# Patient Record
Sex: Female | Born: 1935
Health system: Southern US, Community
[De-identification: ages and names within clinical notes are randomized; demographics above are authoritative.]

## PROBLEM LIST (undated history)

## (undated) DIAGNOSIS — R634 Abnormal weight loss: Secondary | ICD-10-CM

## (undated) DIAGNOSIS — E039 Hypothyroidism, unspecified: Secondary | ICD-10-CM

## (undated) DIAGNOSIS — K219 Gastro-esophageal reflux disease without esophagitis: Secondary | ICD-10-CM

## (undated) DIAGNOSIS — I10 Essential (primary) hypertension: Secondary | ICD-10-CM

## (undated) DIAGNOSIS — K589 Irritable bowel syndrome without diarrhea: Secondary | ICD-10-CM

## (undated) DIAGNOSIS — J189 Pneumonia, unspecified organism: Secondary | ICD-10-CM

## (undated) DIAGNOSIS — I499 Cardiac arrhythmia, unspecified: Secondary | ICD-10-CM

## (undated) DIAGNOSIS — D649 Anemia, unspecified: Secondary | ICD-10-CM

## (undated) DIAGNOSIS — J449 Chronic obstructive pulmonary disease, unspecified: Secondary | ICD-10-CM

## (undated) DIAGNOSIS — N289 Disorder of kidney and ureter, unspecified: Secondary | ICD-10-CM

## (undated) DIAGNOSIS — R0602 Shortness of breath: Secondary | ICD-10-CM

## (undated) DIAGNOSIS — L853 Xerosis cutis: Secondary | ICD-10-CM

## (undated) DIAGNOSIS — A0472 Enterocolitis due to Clostridium difficile, not specified as recurrent: Secondary | ICD-10-CM

## (undated) DIAGNOSIS — M1990 Unspecified osteoarthritis, unspecified site: Secondary | ICD-10-CM

## (undated) HISTORY — DX: Cardiac arrhythmia, unspecified: I49.9

## (undated) HISTORY — PX: CARDIAC CATHETERIZATION: SHX172

## (undated) HISTORY — PX: ESOPHAGOGASTRODUODENOSCOPY: SHX1529

## (undated) HISTORY — PX: THYROIDECTOMY: SHX17

## (undated) HISTORY — PX: TONSILLECTOMY: SUR1361

## (undated) HISTORY — DX: Essential (primary) hypertension: I10

## (undated) HISTORY — DX: Disorder of kidney and ureter, unspecified: N28.9

## (undated) HISTORY — DX: Chronic obstructive pulmonary disease, unspecified: J44.9

## (undated) HISTORY — PX: COLONOSCOPY: SHX174

## (undated) HISTORY — PX: TOE AMPUTATION: SHX809

## (undated) HISTORY — PX: ANTERIOR AND POSTERIOR VAGINAL REPAIR: SUR5

---

## 1998-11-20 ENCOUNTER — Ambulatory Visit (HOSPITAL_BASED_OUTPATIENT_CLINIC_OR_DEPARTMENT_OTHER): Admission: RE | Admit: 1998-11-20 | Discharge: 1998-11-20 | Payer: Self-pay

## 2010-12-17 ENCOUNTER — Ambulatory Visit (INDEPENDENT_AMBULATORY_CARE_PROVIDER_SITE_OTHER): Payer: Medicare Other | Admitting: Internal Medicine

## 2010-12-17 ENCOUNTER — Ambulatory Visit (INDEPENDENT_AMBULATORY_CARE_PROVIDER_SITE_OTHER)
Admission: RE | Admit: 2010-12-17 | Discharge: 2010-12-17 | Disposition: A | Discharge status: Home / Self Care | Payer: Medicare Other | Source: Ambulatory Visit | Attending: Internal Medicine | Admitting: Internal Medicine

## 2010-12-17 ENCOUNTER — Encounter: Payer: Self-pay | Admitting: Internal Medicine

## 2010-12-17 VITALS — BP 110/78 | HR 85 | Resp 16 | Ht 60.0 in | Wt 115.8 lb

## 2010-12-17 DIAGNOSIS — J449 Chronic obstructive pulmonary disease, unspecified: Secondary | ICD-10-CM

## 2010-12-17 DIAGNOSIS — Z23 Encounter for immunization: Secondary | ICD-10-CM

## 2010-12-17 MED ORDER — TIOTROPIUM BROMIDE MONOHYDRATE 18 MCG IN CAPS: 18.0000 ug | ORAL_CAPSULE | Freq: Every day | RESPIRATORY_TRACT | Status: DC

## 2010-12-17 NOTE — Progress Notes (Signed)
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 Wende Bushy in [facility] because of COPD. She had smoked 2 packs per day for 26 years/52 pack years, ending in 1984. Mrs. Libman assumes the diagnosis is COPD. She had seen Dr.DeCoy/Pulmonary in the past before he left town. He treated her with Spiriva which has helped. Symbicort 160 has not helped. She is aware of progressive dyspnea on exertion over the last several years. Has had episodes of bronchitis. Daily cough brings yellow and sometimes green or brown sputum. She denies blood. She was hospitalized once with pneumonia but not for other lung problems. She has not had home oxygen. Has nebulizer machine at home but is not using it. Went to an urgent care this summer for bronchitis, treated with prednisone and an antibiotic. Cardiac problems limited to occasional rapid heartbeat. Anemia treated with iron. Has had 3 spinal steroid injections and is followed by a spine specialist. 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 Intolerant of aspirin and Avelox. Asks flu shot. Has had pneumococcal vaccine but not sure of the date.  ROS See HPI Constitutional:   No-   weight loss, night sweats, fevers, chills, fatigue, lassitude. HEENT:   No-  headaches, difficulty swallowing, tooth/dental problems, sore throat,       No-  sneezing, itching, ear ache, nasal congestion, post nasal drip,  CV:  No-   chest pain, orthopnea, PND, swelling in lower extremities, anasarca, dizziness, palpitations Resp: +  shortness of breath with exertion, Not or at rest.              +  productive cough,  No non-productive cough,  No- coughing up of blood.              No-   change in color of mucus.  No- wheezing.   Skin: No-   rash or lesions. GI:  No-   heartburn, indigestion, abdominal pain, nausea,  vomiting, diarrhea,                 change in bowel habits, loss of appetite GU: No-   dysuria, change in color of urine, no urgency or frequency.  No- flank pain. MS:  No-   joint pain or swelling.  No- decreased range of motion.  + back pain. Neuro-     nothing unusual Psych:  No- change in mood or affect. No depression or anxiety.  No memory loss.  OBJ General- Alert, Oriented, Affect-appropriate, Distress- none acute; alert healthy appearing woman Skin- rash-none, lesions- none, excoriation- none Lymphadenopathy- none Head- atraumatic            Eyes- Gross vision intact, PERRLA, conjunctivae clear secretions            Ears- Hearing, canals-normal            Nose- Clear, no-Septal dev, mucus, polyps, erosion, perforation             Throat- Mallampati II , mucosa clear , drainage- none, tonsils- atrophic Neck- flexible , trachea midline, no stridor , thyroid nl, carotid no bruit Chest - symmetrical excursion , unlabored           Heart/CV- RRR , no murmur , no gallop  , no rub, nl s1 s2                           -  JVD- none , edema- none, stasis changes- none, varices- none           Lung- clear to P&A- distant,   wheeze- none, cough- none , dullness-none, rub- none           Chest wall-  Abd- tender-no, distended-no, bowel sounds-present, HSM- no Br/ Gen/ Rectal- Not done, not indicated Extrem- cyanosis- none, clubbing, none, atrophy- none, strength- nl Neuro- grossly intact to observation

## 2010-12-17 NOTE — Patient Instructions (Addendum)
Continue spiriva - refill script to send  Don't need to use Symbicort  May use Proair rescue inhaler up to 4 times daily if needed  Order- CXR dx COPD  Flu vax  Pneumonia vax

## 2010-12-19 NOTE — Progress Notes (Signed)
Quick Note:  Spoke with patient regarding results and CT Chest reccommended; pt states she does not want to go through with this until she talks with her kidney doctor tomorrow. Pt will call me back and let me know her decision; CY has been notified of this matter. Pt understands we draw labs prior to CT chest for kidney functions but still refuses until she speaks with her kidney dr. Stann Mainland await a call back tomorrow. ______

## 2010-12-20 ENCOUNTER — Institutional Professional Consult (permissible substitution): Payer: Self-pay | Admitting: Internal Medicine

## 2010-12-20 ENCOUNTER — Telehealth: Payer: Self-pay | Admitting: Internal Medicine

## 2010-12-20 ENCOUNTER — Encounter: Payer: Self-pay | Admitting: Internal Medicine

## 2010-12-20 DIAGNOSIS — R918 Other nonspecific abnormal finding of lung field: Secondary | ICD-10-CM

## 2010-12-20 MED ORDER — ALBUTEROL SULFATE HFA 108 (90 BASE) MCG/ACT IN AERS: 2.0000 | INHALATION_SPRAY | RESPIRATORY_TRACT | Status: DC | PRN

## 2010-12-20 NOTE — Telephone Encounter (Signed)
I called and spoke with patient-states she spoke to her Kidney doctor-Dr Leretha Dykes in [facility] like to speak with CY/nurse regarding patient and why the contrast needs to be used. I explained to patient that we will contact Dr Leretha Dykes and have her and CY discuss this matter.

## 2010-12-20 NOTE — Assessment & Plan Note (Signed)
We certainly expect COPD in this setting but have a little objective confirmation as yet. 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 Plan-flu vaccine and we will update pneumococcal vaccine to be sure that she has had it. Chest x-ray. Anticipate scheduling PFT.

## 2010-12-20 NOTE — Progress Notes (Signed)
Quick Note:  Pt returned my call and per Kidney Dr she is at risk for dyialisis and therefore CY has changed order to CT chest no contrast. Pt is aware of order sent to [facility]. ______

## 2010-12-20 NOTE — Telephone Encounter (Signed)
Per CY-okay to just change the order to CT chest no contrast. Pt is willing to have Test done now and understands the order has been placed and [facility] will call with information.

## 2010-12-24 ENCOUNTER — Ambulatory Visit (INDEPENDENT_AMBULATORY_CARE_PROVIDER_SITE_OTHER)
Admission: RE | Admit: 2010-12-24 | Discharge: 2010-12-24 | Disposition: A | Discharge status: Home / Self Care | Payer: Medicare Other | Source: Ambulatory Visit | Attending: Internal Medicine | Admitting: Internal Medicine

## 2010-12-24 DIAGNOSIS — R222 Localized swelling, mass and lump, trunk: Secondary | ICD-10-CM

## 2010-12-24 DIAGNOSIS — R918 Other nonspecific abnormal finding of lung field: Secondary | ICD-10-CM

## 2010-12-28 NOTE — Progress Notes (Signed)
Quick Note:  Spoke with patient regarding results and recs from CY-patient states her PCP Dr Wende Bushy at [facility] in [place], [place] has retired/moved on and she doesn't have a PCP at this time. I have the number to [facility] and called to see which PCP patient will have now-[phone]-office is closed at this time and will need to call back after 130 pm. In the meantime patient is calling her Kidney Dr to see if she is willing to receive results and order thyroid US for patient. Pt will call me back asap today. ______

## 2011-01-01 NOTE — Progress Notes (Signed)
Quick Note:  Spoke with patient-she stated she will now have Dr. Tomma Lightning as her PCP at [facility]. Pt understands I will send a copy of the results to this PCP and at the request of patient I have sent a copy to Dr Shela Commons. Zeken-patients kidney MD in [place]. Pt requested a copy sent to her home address as well. ______

## 2011-01-16 ENCOUNTER — Emergency Department (INDEPENDENT_AMBULATORY_CARE_PROVIDER_SITE_OTHER): Payer: Medicare Other

## 2011-01-16 ENCOUNTER — Emergency Department (HOSPITAL_BASED_OUTPATIENT_CLINIC_OR_DEPARTMENT_OTHER)
Admission: EM | Admit: 2011-01-16 | Discharge: 2011-01-16 | Disposition: A | Discharge status: Home / Self Care | Payer: Medicare Other | Attending: Emergency Medicine | Admitting: Emergency Medicine

## 2011-01-16 ENCOUNTER — Encounter (HOSPITAL_BASED_OUTPATIENT_CLINIC_OR_DEPARTMENT_OTHER): Payer: Self-pay

## 2011-01-16 DIAGNOSIS — S0181XA Laceration without foreign body of other part of head, initial encounter: Secondary | ICD-10-CM

## 2011-01-16 DIAGNOSIS — J449 Chronic obstructive pulmonary disease, unspecified: Secondary | ICD-10-CM | POA: Insufficient documentation

## 2011-01-16 DIAGNOSIS — W19XXXA Unspecified fall, initial encounter: Secondary | ICD-10-CM

## 2011-01-16 DIAGNOSIS — Z79899 Other long term (current) drug therapy: Secondary | ICD-10-CM | POA: Insufficient documentation

## 2011-01-16 DIAGNOSIS — M25559 Pain in unspecified hip: Secondary | ICD-10-CM

## 2011-01-16 DIAGNOSIS — I10 Essential (primary) hypertension: Secondary | ICD-10-CM | POA: Insufficient documentation

## 2011-01-16 DIAGNOSIS — M8708 Idiopathic aseptic necrosis of bone, other site: Secondary | ICD-10-CM

## 2011-01-16 DIAGNOSIS — G319 Degenerative disease of nervous system, unspecified: Secondary | ICD-10-CM

## 2011-01-16 DIAGNOSIS — S0990XA Unspecified injury of head, initial encounter: Secondary | ICD-10-CM

## 2011-01-16 DIAGNOSIS — S0180XA Unspecified open wound of other part of head, initial encounter: Secondary | ICD-10-CM | POA: Insufficient documentation

## 2011-01-16 DIAGNOSIS — J4489 Other specified chronic obstructive pulmonary disease: Secondary | ICD-10-CM | POA: Insufficient documentation

## 2011-01-16 DIAGNOSIS — M4120 Other idiopathic scoliosis, site unspecified: Secondary | ICD-10-CM

## 2011-01-16 DIAGNOSIS — I6789 Other cerebrovascular disease: Secondary | ICD-10-CM

## 2011-01-16 MED ORDER — LIDOCAINE-EPINEPHRINE 2 %-1:100000 IJ SOLN
INTRAMUSCULAR | Status: AC
  Administered 2011-01-16: 20 mL via INTRADERMAL
  Filled 2011-01-16: qty 1

## 2011-01-16 MED ORDER — LIDOCAINE-EPINEPHRINE 2 %-1:100000 IJ SOLN
20.0000 mL | Freq: Once | INTRAMUSCULAR | Status: AC
  Administered 2011-01-16: 20 mL via INTRADERMAL

## 2011-01-16 NOTE — ED Provider Notes (Signed)
History     CSN: [id number] Arrival date & time: 01/16/2011 12:10 PM   First MD Initiated Contact with Patient 01/16/11 1213      Chief Complaint  Patient presents with  . Fall  . Head Injury     Patient is a 77 y.o. female presenting with fall. The history is provided by the patient. No language interpreter was used.  Fall The accident occurred 1 to 2 hours ago. The fall occurred while standing. She fell from a height of 3 to 5 ft. She landed on a hard floor. The point of impact was the head. The pain is present in the left hip and head. The pain is at a severity of 3/10. The pain is mild. She was ambulatory at the scene. There was no entrapment after the fall. There was no alcohol use involved in the accident. Pertinent negatives include no visual change, no nausea, no vomiting, no headaches and no loss of consciousness. The symptoms are aggravated by standing. She has tried nothing for the symptoms.   Reports was reaching high for a garage door closing handle and fell backwards. Thinks she may have hit her head on the car in the garage. Denies. LOC.  Past Medical History  Diagnosis Date  . Hypertension   . Abnormal heart rhythm   . COPD (chronic obstructive pulmonary disease)   . Kidney disease     Past Surgical History  Procedure Date  . Tonsillectomy   . Thyroidectomy     Left  . Esophagogastroduodenoscopy     Family History  Problem Relation Age of Onset  . Emphysema Sister     smoker  . Allergies Brother   . Heart disease Brother   . Rheum arthritis Daughter   . Cancer Mother     GI  . Cancer Brother     throat  . Cancer Sister     unsure    History  Substance Use Topics  . Smoking status: Former Smoker -- 2.0 packs/day for 26 years    Types: Cigarettes    Quit date: 03/04/1982  . Smokeless tobacco: Not on file  . Alcohol Use: 0.6 oz/week    1 Shots of liquor per week    OB History    Grav Para Term Preterm Abortions TAB SAB Ect Mult Living              Review of Systems  Constitutional: Negative.   HENT: Negative.   Eyes: Negative.   Respiratory: Negative.   Cardiovascular: Negative.   Gastrointestinal: Negative.  Negative for nausea and vomiting.  Genitourinary: Negative.   Musculoskeletal: Negative.   Skin: Negative.   Neurological: Negative.  Negative for loss of consciousness and headaches.  Hematological: Negative.   Psychiatric/Behavioral: Negative.     Allergies  Avelox and Nsaids  Home Medications   Current Outpatient Rx  Name Route Sig Dispense Refill  . ALBUTEROL SULFATE HFA 108 (90 BASE) MCG/ACT IN AERS Inhalation Inhale 2 puffs into the lungs every 4 (four) hours as needed for wheezing or shortness of breath. 1 Inhaler prn  . AMLODIPINE BESYLATE 5 MG PO TABS Oral Take 1 tablet by mouth daily.    . ASPIRIN 81 MG PO TABS Oral Take 81 mg by mouth daily.      . CYMBALTA 60 MG PO CPEP Oral Take 1 tablet by mouth daily.    Marland Kitchen FAMOTIDINE 40 MG PO TABS Oral Take 1 tablet by mouth daily.    Marland Kitchen METOPROLOL  SUCCINATE 50 MG PO TB24 Oral Take 50 mg by mouth daily.      Marland Kitchen ONE-DAILY MULTI VITAMINS PO TABS Oral Take 1 tablet by mouth daily.      Marland Kitchen SYNTHROID 88 MCG PO TABS Oral Take 1 tablet by mouth daily.    Marland Kitchen TIOTROPIUM BROMIDE MONOHYDRATE 18 MCG IN CAPS Inhalation Place 1 capsule (18 mcg total) into inhaler and inhale daily. 90 capsule 3    BP 102/86  Pulse 81  Temp(Src) 97.9 F (36.6 C) (Oral)  Resp 16  Ht 5' (1.524 m)  Wt 116 lb (52.617 kg)  BMI 22.65 kg/m2  SpO2 98%  Physical Exam  Constitutional: She is oriented to person, place, and time. She appears well-developed and well-nourished.  HENT:  Head: Normocephalic.    Right Ear: External ear normal.  Left Ear: External ear normal.       Approx 2cm lac to (L) periorbital area. Lac noted w/ complete avulsion of tissue at center. Small amount active bleeding.  Eyes: Conjunctivae and EOM are normal. Pupils are equal, round, and reactive to light.    Neck: Normal range of motion.  Cardiovascular: Normal rate and regular rhythm.   Pulmonary/Chest: Effort normal and breath sounds normal.  Abdominal: Soft. Bowel sounds are normal. There is no tenderness.  Musculoskeletal: Normal range of motion.       Left hip: She exhibits tenderness.  Neurological: She is alert and oriented to person, place, and time. No cranial nerve deficit.  Skin: Skin is warm and dry.  Psychiatric: She has a normal mood and affect.    ED Course  LACERATION REPAIR Performed by: Leanne Chang Authorized by: Leanne Chang Consent: Verbal consent obtained. Written consent not obtained. Consent given by: patient Patient understanding: patient states understanding of the procedure being performed Relevant documents: relevant documents present and verified Test results: test results available and properly labeled Imaging studies: imaging studies available Patient identity confirmed: verbally with patient Body area: head/neck Laceration length: 3 cm Tendon involvement: none Nerve involvement: none Vascular damage: no Anesthesia: local infiltration Local anesthetic: lidocaine 1% with epinephrine Patient sedated: no Preparation: Patient was prepped and draped in the usual sterile fashion. Irrigation solution: saline Irrigation method: syringe Amount of cleaning: standard Debridement: none Degree of undermining: minimal Skin closure: 4-0 Prolene and 5-0 Prolene Technique: simple Approximation difficulty: simple Dressing: antibiotic ointment Comments: Wound edges anchored and aligned with 3 4.0 prolene sutures to approximate wound edges. Wound closure completed w/ 4 additional 5.0 prolene sutures. Wound edges well approximated.  Findings, wound/lac care and d/c plan discussed w/ pt. Pt agreeable w/ plan. Will encourage close f/u w/ PCP at [facility] (or here for suture removal). Pt agreeable w/ plan.   Labs Reviewed - No data  to display No results found.   No diagnosis found.    MDM  CT head and (L) hip imaging w/o acute findings. Pt w/o focal neurological findings.  Wound to (L) temporal/periorbital area repaired. T-Dap UTD.       Leanne Chang, NP 01/18/11 1610  Leanne Chang, NP 01/18/11 [phone]  Medical screening examination/treatment/procedure(s) were performed by non-physician practitioner and as supervising physician I was immediately available for consultation/collaboration.  Cyndra Numbers, MD 01/20/11 [phone]

## 2011-01-16 NOTE — ED Notes (Signed)
Pt was reaching up-lost balance-fell onto car then to ground-pain to left occipital area and lac to left lateral temple at eye brow-denies LOC

## 2011-01-22 ENCOUNTER — Encounter (HOSPITAL_BASED_OUTPATIENT_CLINIC_OR_DEPARTMENT_OTHER): Payer: Self-pay | Admitting: *Deleted

## 2011-01-22 ENCOUNTER — Emergency Department (HOSPITAL_BASED_OUTPATIENT_CLINIC_OR_DEPARTMENT_OTHER)
Admission: EM | Admit: 2011-01-22 | Discharge: 2011-01-22 | Disposition: A | Discharge status: Home / Self Care | Payer: Medicare Other | Attending: Emergency Medicine | Admitting: Emergency Medicine

## 2011-01-22 DIAGNOSIS — J449 Chronic obstructive pulmonary disease, unspecified: Secondary | ICD-10-CM | POA: Insufficient documentation

## 2011-01-22 DIAGNOSIS — J4489 Other specified chronic obstructive pulmonary disease: Secondary | ICD-10-CM | POA: Insufficient documentation

## 2011-01-22 DIAGNOSIS — I10 Essential (primary) hypertension: Secondary | ICD-10-CM | POA: Insufficient documentation

## 2011-01-22 DIAGNOSIS — Z4802 Encounter for removal of sutures: Secondary | ICD-10-CM | POA: Insufficient documentation

## 2011-01-22 DIAGNOSIS — Z79899 Other long term (current) drug therapy: Secondary | ICD-10-CM | POA: Insufficient documentation

## 2011-01-22 NOTE — ED Provider Notes (Signed)
History     CSN: [id number] Arrival date & time: 01/22/2011 10:28 AM   First MD Initiated Contact with Patient 01/22/11 1019      Chief Complaint  Patient presents with  . Suture / Staple Removal    (Consider location/radiation/quality/duration/timing/severity/associated sxs/prior treatment) Patient is a 77 y.o. female presenting with suture removal. The history is provided by the patient.  Suture / Staple Removal  The sutures were placed 3 to 6 days ago. There has been no treatment since the wound repair. There has been no drainage from the wound. There is no redness present. There is no swelling present. The pain has improved.   patient has stitches in her left face after fall or placed on Wednesday last week. She returns to have them removed.  Past Medical History  Diagnosis Date  . Hypertension   . Abnormal heart rhythm   . COPD (chronic obstructive pulmonary disease)   . Kidney disease     Past Surgical History  Procedure Date  . Tonsillectomy   . Thyroidectomy     Left  . Esophagogastroduodenoscopy     Family History  Problem Relation Age of Onset  . Emphysema Sister     smoker  . Allergies Brother   . Heart disease Brother   . Rheum arthritis Daughter   . Cancer Mother     GI  . Cancer Brother     throat  . Cancer Sister     unsure    History  Substance Use Topics  . Smoking status: Former Smoker -- 2.0 packs/day for 26 years    Types: Cigarettes    Quit date: 03/04/1982  . Smokeless tobacco: Not on file  . Alcohol Use: 0.6 oz/week    1 Shots of liquor per week    OB History    Grav Para Term Preterm Abortions TAB SAB Ect Mult Living                  Review of Systems  Skin: Positive for wound.  Hematological: Bruises/bleeds easily.    Allergies  Avelox and Nsaids  Home Medications   Current Outpatient Rx  Name Route Sig Dispense Refill  . ALBUTEROL SULFATE HFA 108 (90 BASE) MCG/ACT IN AERS Inhalation Inhale 2 puffs into the lungs  every 4 (four) hours as needed for wheezing or shortness of breath. 1 Inhaler prn  . AMLODIPINE BESYLATE 5 MG PO TABS Oral Take 1 tablet by mouth daily.    . ASPIRIN 81 MG PO TABS Oral Take 81 mg by mouth daily.      . CYMBALTA 60 MG PO CPEP Oral Take 1 tablet by mouth daily.    Marland Kitchen FAMOTIDINE 40 MG PO TABS Oral Take 1 tablet by mouth daily.    Marland Kitchen METOPROLOL SUCCINATE 50 MG PO TB24 Oral Take 50 mg by mouth daily.      Marland Kitchen ONE-DAILY MULTI VITAMINS PO TABS Oral Take 1 tablet by mouth daily.      Marland Kitchen SYNTHROID 88 MCG PO TABS Oral Take 1 tablet by mouth daily.    Marland Kitchen TIOTROPIUM BROMIDE MONOHYDRATE 18 MCG IN CAPS Inhalation Place 1 capsule (18 mcg total) into inhaler and inhale daily. 90 capsule 3    BP 111/96  Pulse 75  Temp(Src) 97.8 F (36.6 C) (Oral)  Resp 18  SpO2 98%  Physical Exam  Constitutional: She appears well-developed.  HENT:       Approximately 2 cm laceration to his left periorbital area. 7  Stitches in Pl. some scab no drainage. Mild tenderness no fluctuance. She has bruising along the left face and on her left neck    ED Course  SUTURE REMOVAL Date/Time: 01/22/2011 10:40 AM Performed by: Benjiman Core R. Authorized by: Billee Cashing Consent: Verbal consent obtained. Written consent not obtained. Risks and benefits: risks, benefits and alternatives were discussed Patient understanding: patient states understanding of the procedure being performed Patient identity confirmed: verbally with patient Body area: head/neck Location details: forehead Wound Appearance: clean Sutures Removed: 7 Facility: sutures placed in this facility Patient tolerance: Patient tolerated the procedure well with no immediate complications.   (including critical care time)  Labs Reviewed - No data to display No results found.   1. Visit for suture removal       MDM  Sutures removed from a well-healing wound.        Juliet Rude. Rubin Payor, MD 01/22/11 1042

## 2011-01-22 NOTE — ED Notes (Signed)
Patient here for suture removal from left face.

## 2011-01-25 ENCOUNTER — Encounter: Payer: Self-pay | Admitting: Internal Medicine

## 2011-01-25 ENCOUNTER — Ambulatory Visit (INDEPENDENT_AMBULATORY_CARE_PROVIDER_SITE_OTHER): Payer: Medicare Other | Admitting: Internal Medicine

## 2011-01-25 VITALS — BP 108/78 | HR 99 | Ht 60.0 in | Wt 113.2 lb

## 2011-01-25 DIAGNOSIS — J449 Chronic obstructive pulmonary disease, unspecified: Secondary | ICD-10-CM

## 2011-01-25 DIAGNOSIS — M8700 Idiopathic aseptic necrosis of unspecified bone: Secondary | ICD-10-CM

## 2011-01-25 NOTE — Patient Instructions (Signed)
You have a chronic bronchitis and the CT suggests the possibility of a kind of slow infection called MAIC. We don't need to anything now but keep a watch.  We will plan on a repeat CT in 6 months.   Order- sputum for AFB c&s

## 2011-01-25 NOTE — Progress Notes (Signed)
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 Wende [name] in [facility] because of [name]. She had smoked 2 packs per day for 26 years/52 pack years, ending in 1984. [name]. She had seen Dr.DeCoy/Pulmonary in the past before he left town. He treated her with Spiriva which has helped. Symbicort 160 has not helped. She is aware of progressive dyspnea on exertion over the last several years. Has had episodes of bronchitis. Daily cough brings yellow and sometimes green or brown sputum. She denies blood. She was hospitalized once with pneumonia but not for other lung problems. She has not had home oxygen. Has nebulizer machine at home but is not using it. Went to an urgent care this summer for bronchitis, treated with prednisone and an antibiotic. Cardiac problems limited to occasional rapid heartbeat. Anemia treated with iron. Has had 3 spinal steroid injections and is followed by a spine specialist. 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 Intolerant of aspirin and Avelox. Asks flu shot. Has had pneumococcal vaccine but not sure of the date.  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 She had smoked 2 packs per day for 26 years/52 pack years, ending in 1984. Has had flu and pneumonia vaccines. Since last here she had fallen and hit her head with bruising. Has aseptic necrosis of the right hip and is seeing Dr.Took/ Ortho, but does not want surgery. Occasional cough and persistent throat clearing, some wheeze and a few night sweats. She remembers getting a nebulizer treatment once which gave ability to clear mucus from her airways. She called a television advertisement and got a nebulizer machine and medication from an out of state company. She has used  it only once and got little sputum up with that trial. CT 12/20/10- I reviewed images with her- IMPRESSION:  1. Pulmonary parenchymal pattern of diffuse mild bronchiectasis  and peribronchovascular nodularity is indicative of mycobacterium  avium complex (MAC).  2. Vague area of irregular nodularity in the right lower lobe  (image 32). While this is likely within the spectrum of presumed  MAC, a pulmonary nodule cannot be definitively excluded. If the  patient is at high risk for bronchogenic carcinoma, follow-up chest  CT at 3-6 months is recommended. If the patient is at low risk for  bronchogenic carcinoma, follow-up chest CT at 6-12 months is  recommended. This recommendation follows the consensus statement:  Guidelines for Management of Small Pulmonary Nodules Detected on CT  Scans: A Statement from the Fleischner Society as published in  Radiology 2005; 237:395-400. Online at:  DietDisorder.cz.  3. Dominant right thyroid nodule with associated calcifications.  Thyroid ultrasound is recommended in further evaluation, as  clinically indicated.  4. Coronary artery calcification.  Original Report Authenticated By: Reyes Ivan, M.D.      ROS-See HPI Constitutional:   No-   weight loss, night sweats, fevers, chills, fatigue, lassitude. HEENT:   No-  headaches, difficulty swallowing, tooth/dental problems, sore throat,       No-  sneezing, itching, ear ache, nasal congestion, post nasal drip,  CV:  No-   chest pain, orthopnea, PND, swelling in lower extremities, anasarca, dizziness, palpitations Resp: +  shortness of breath with exertion, Not or at rest.              +  productive cough,  No non-productive cough,  No- coughing up of blood.              No-   change in color of mucus.  No- wheezing.   Skin: No-   rash or lesions. GI:  No-   heartburn, indigestion, abdominal pain, nausea, vomiting, diarrhea,                 change in bowel  habits, loss of appetite GU: No-   dysuria, change in color of urine, no urgency or frequency.  No- flank pain. MS:  No-   joint pain or swelling.  No- decreased range of motion.  + back pain. Neuro-     nothing unusual Psych:  No- change in mood or affect. No depression or anxiety.  No memory loss.  OBJ General- Alert, Oriented, Affect-appropriate, Distress- none acute; look frail Skin- rash-none,excoriation- none. Large bruise left mid face Lymphadenopathy- none Head- atraumatic            Eyes- Gross vision intact, PERRLA, conjunctivae clear secretions            Ears- Hearing, canals-normal            Nose- Clear, no-Septal dev, mucus, polyps, erosion, perforation             Throat- Mallampati II , mucosa clear , drainage- none seen, but she is throat clearing, tonsils- atrophic Neck- flexible , trachea midline, no stridor , thyroid nl, carotid no bruit Chest - symmetrical excursion , unlabored           Heart/CV- RRR , no murmur , no gallop  , no rub, nl s1 s2                           - JVD- none , edema- none, stasis changes- none, varices- none           Lung- clear to P&A- distant,   wheeze- none, cough- none , dullness-none, rub- none           Chest wall-  Abd- tender-no, distended-no, bowel sounds-present, HSM- no Br/ Gen/ Rectal- Not done, not indicated Extrem- cyanosis- none, clubbing, none, atrophy- none, strength- nl Neuro- grossly intact to observation

## 2011-01-27 DIAGNOSIS — M8700 Idiopathic aseptic necrosis of unspecified bone: Secondary | ICD-10-CM | POA: Insufficient documentation

## 2011-01-27 NOTE — Assessment & Plan Note (Signed)
She is really not symptomatic today of an active bronchitis, but the CT scan is consistent with bronchitis and bronchiectasis and nodularity suggestive of atypical AFB/MAIC. We discussed the radiology recommendations and reviewed the images together. We will plan repeat CT scan in 6 months. Culture sputum for AFB.

## 2011-01-31 ENCOUNTER — Telehealth: Payer: Self-pay | Admitting: Internal Medicine

## 2011-01-31 MED ORDER — DOXYCYCLINE HYCLATE 100 MG PO TABS: ORAL_TABLET | ORAL | Status: DC

## 2011-01-31 NOTE — Telephone Encounter (Signed)
Spoke with pt. She c/o prod cough with minimal yellow sputum, chest tightness, chills and sore throat- onset 01/26/11 (day after her last visit here). She states breathing doing about the same. Would like something called to pharm. Please advise, thanks! Allergies  Allergen Reactions  . Avelox (Moxifloxacin Hcl In Nacl) Other (See Comments)    Flushes face  . Nsaids     Does not take due to kidney disease

## 2011-01-31 NOTE — Telephone Encounter (Signed)
Per CY-give Doxycycline 100 mg #8 take 2 today then 1 daily til gone; no refills.

## 2011-01-31 NOTE — Telephone Encounter (Signed)
Rx sent to pharm and pt aware 

## 2011-02-01 ENCOUNTER — Other Ambulatory Visit: Payer: Medicare Other

## 2011-02-01 DIAGNOSIS — J449 Chronic obstructive pulmonary disease, unspecified: Secondary | ICD-10-CM

## 2011-02-04 LAB — RESPIRATORY CULTURE OR RESPIRATORY AND SPUTUM CULTURE

## 2011-02-15 ENCOUNTER — Telehealth: Payer: Self-pay | Admitting: Internal Medicine

## 2011-02-15 NOTE — Telephone Encounter (Signed)
Called and spoke with elizabeth at [employer] [facility].  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  Will sign message and forward to CY to make him aware.

## 2011-03-14 ENCOUNTER — Telehealth: Payer: Self-pay | Admitting: Internal Medicine

## 2011-03-14 NOTE — Telephone Encounter (Signed)
I called her this afternoon. Lungs feel stable with some chronic cough- no fever, sweat, significant sputum or blood. We reviewed again the report from her October chest CT showing changes c/w MAIC, questionable nodularity for f/u, and coronary calcification. She is in 3-6 month window suggested by radiology for a f/u study, but feels overwhelmed now w/ recent death in family and wants to wait for f/u w/ me and CT in May as scheduled.  She is satisfied to wait on decision to initiate any long-term Rx for the [facility]. I will send letter and copy of CT report to the surgeon at [facility].

## 2011-03-14 NOTE — Telephone Encounter (Signed)
Called and spoke with pt. Pt states she is trying to get surgery scheduled for R hip in the next few weeks as it is causing her a lot of pain. Dr. Tresa Endo from [facility] will be doing the surgery.  She states it will be with "spinal anaesthesia with an anterior approach."  Pt wanted to get a letter from [place] clearing her for surgery.  CY, please advise if you are ok with this or would you like an appt with her to evaluate?  Thanks!

## 2011-04-09 ENCOUNTER — Telehealth: Payer: Self-pay | Admitting: Internal Medicine

## 2011-04-09 NOTE — Telephone Encounter (Signed)
According to last message 03/14/11 a letter and results of pt CT scan were going to be sent to [facility]. Pt is calling to confirm this was done. I do not see where a letter was done in epic. Please advise Dr. Maple Hudson, thanks

## 2011-04-10 NOTE — Telephone Encounter (Signed)
Per CY-letter has been done; add CT results with letter when signed letter comes up.

## 2011-04-10 NOTE — Telephone Encounter (Signed)
Letter dictated

## 2011-04-11 NOTE — Letter (Signed)
April 10, 2011   Earlene Plater, MD [facility] [address] [place], [place] [address]  RE:  JANAKI, EXLEY MRN:  [id number]  /  DOB:  08/12/1928  Dear Dr. Tresa Endo:  This 78 year old former smoker, a retired Designer, jewellery, has been seen in our pulmonary office on December 17, 2010 and January 25, 2011 for a chronic bronchitis syndrome.  A copy of my note is enclosed. Pulmonary function testing is pending, but she has had a pulmonary evaluation in the past elsewhere and has been fairly stable.  Activity is limited more by hip pain.  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  This is an atypical acid-fast organisms with very low risk of communication from the patient to others.  Clinically, she is stable for anticipated surgery under spinal anesthesia with an anterior approach as she indicated to [place].  Her age and her chronic bronchitis places her at some increased risk of respiratory complications, which would need to be watched for, including healthcare-acquired pneumonia, aspiration, and difficulty clearing secretions.  I do not expect unusual risk beyond that.  We will see her again on office return after her surgery.  Please feel free to call as needed.   Sincerely,     Clinton D. Maple Hudson, MD, Tonny Bollman, FACP Electronically Signed   CDY/MedQ  DD: 04/10/2011  DT: 04/11/2011  Job #: [id number]

## 2011-04-12 NOTE — Telephone Encounter (Signed)
Spoke with Raynelle Fanning in Transcription-she is Catering manager out but will need CY to sign in docusign. I have left a message for him to take care of this.

## 2011-04-15 NOTE — Telephone Encounter (Signed)
I called to inform the patient that we have letter and CT information ready and would send to Dr Tresa Endo at [facility]-pt informed me that she has cancelled this appointment/surgery as she didn't feel comfortable with having surgery that far from home. Pt has scheduled appointment with someone closer to home and will keep me posted regarding this. Will sign off on this message and wait for patient to call me back.

## 2011-04-15 NOTE — Telephone Encounter (Signed)
Per Florentina Addison she has the letter and is awaiting cdy signature

## 2011-04-22 ENCOUNTER — Encounter (HOSPITAL_COMMUNITY): Payer: Self-pay | Admitting: Pharmacy Technician

## 2011-04-22 ENCOUNTER — Other Ambulatory Visit: Payer: Self-pay | Admitting: Orthopedic Surgery

## 2011-04-23 ENCOUNTER — Encounter (HOSPITAL_COMMUNITY)
Admission: RE | Admit: 2011-04-23 | Discharge: 2011-04-23 | Disposition: A | Discharge status: Home / Self Care | Payer: Medicare Other | Source: Ambulatory Visit | Attending: Orthopedic Surgery | Admitting: Orthopedic Surgery

## 2011-04-23 ENCOUNTER — Encounter (HOSPITAL_COMMUNITY): Payer: Self-pay

## 2011-04-23 ENCOUNTER — Other Ambulatory Visit: Payer: Self-pay

## 2011-04-23 HISTORY — DX: Shortness of breath: R06.02

## 2011-04-23 HISTORY — DX: Unspecified osteoarthritis, unspecified site: M19.90

## 2011-04-23 HISTORY — DX: Hypothyroidism, unspecified: E03.9

## 2011-04-23 HISTORY — DX: Gastro-esophageal reflux disease without esophagitis: K21.9

## 2011-04-23 HISTORY — DX: Abnormal weight loss: R63.4

## 2011-04-23 LAB — DIFFERENTIAL
Basophils Absolute: 0.1 10*3/uL (ref 0.0–0.1)
Eosinophils Absolute: 0.3 10*3/uL (ref 0.0–0.7)
Eosinophils Relative: 4 % (ref 0–5)

## 2011-04-23 LAB — URINALYSIS, ROUTINE W REFLEX MICROSCOPIC
Bilirubin Urine: NEGATIVE
Glucose, UA: NEGATIVE mg/dL
Ketones, ur: NEGATIVE mg/dL
Leukocytes, UA: NEGATIVE
Protein, ur: NEGATIVE mg/dL

## 2011-04-23 LAB — CBC
MCH: 28.3 pg (ref 26.0–34.0)
MCV: 86.1 fL (ref 78.0–100.0)
Platelets: 395 10*3/uL (ref 150–400)
RDW: 13.8 % (ref 11.5–15.5)

## 2011-04-23 LAB — BASIC METABOLIC PANEL
CO2: 28 mEq/L (ref 19–32)
Calcium: 10.1 mg/dL (ref 8.4–10.5)
Creatinine, Ser: 1.13 mg/dL — ABNORMAL HIGH (ref 0.50–1.10)
Glucose, Bld: 86 mg/dL (ref 70–99)

## 2011-04-23 LAB — PROTIME-INR: Prothrombin Time: 14.6 seconds (ref 11.6–15.2)

## 2011-04-23 MED ORDER — DEXTROSE-NACL 5-0.45 % IV SOLN: INTRAVENOUS | Status: DC

## 2011-04-23 MED ORDER — CHLORHEXIDINE GLUCONATE 4 % EX LIQD
60.0000 mL | Freq: Once | CUTANEOUS | Status: DC
  Filled 2011-04-23: qty 60

## 2011-04-23 MED ORDER — CEFAZOLIN SODIUM 1-5 GM-% IV SOLN
1.0000 g | Freq: Once | INTRAVENOUS | Status: AC
  Administered 2011-04-24: 1 g via INTRAVENOUS
  Filled 2011-04-23: qty 50

## 2011-04-23 NOTE — H&P (Signed)
Subjective: [name] is an 78 year old patient who has seen Dr. Judson Roch for pain in her right hip.  She has x-rays demonstrating end-stage arthritis.  She was scheduled for hip replacement by a physician at [facility] but has decided that she would like to have a doctor closer to home.  She has tried tramadol and Vicodin for pain control but reports that at this point her pain is so significant that she does not sleep at night and has difficulty ambulating.  She uses a cane.  She localizes her discomfort to the groin area.  Past medical history: Hypertension, thyroid disease, lumbar stenosis.   She's allergic to Avelox.   Previous surgeries: bunions, tonsillectomy and thyroid adenoma.   Family history: positive for diabetes, hypertension, heart disease and arthritis.   Social history: she is currently a non-smoker but did smoke for 25 years she's a widow and retired.  Complete review of 14 systems positive for eyeglasses hearing loss, hypertension, chronic renal failure, sleep disorder and easy bruising.   PHYSICAL EXAM: Well-developed, well-nourished.  Awake, alert, and oriented x3.  Extraocular motion is intact.  No use of accessory respiratory muscles for breathing.   Cardiovascular exam reveals a regular rhythm.  Skin is intact without cuts, scrapes, or abrasions. Examination of the right hip demonstrates significant pain with attempts at internal rotation.  Foot tap is negative.  She ambulates with a significantly antalgic gait.  She is neurovascularly intact.  X-rays: A pelvis x-ray taken in June of 2012 was reviewed in our office today.  They demonstrate end-stage arthritis in the superior weightbearing dome of the right hip with lateral subluxation of the femoral head.  Impression: End-stage arthritis of the right hip.  Plan: [name] complains of severe unremitting pain and would like to proceed with right hip replacement.  All risks and benefits of surgery were discussed with the  patient.

## 2011-04-23 NOTE — Pre-Procedure Instructions (Signed)
20 [name]  04/23/2011   Your procedure is scheduled on:  Wednesday, February 20  Report to [facility] at 11:00 AM.  Call this number if you have problems the morning of surgery: [phone]   Remember:   Do not eat food:After Midnight.  May have clear liquids: up to 4 Hours before arrival.  Clear liquids include soda, tea, black coffee, apple or grape juice, broth.  Take these medicines the morning of surgery with A SIP OF WATER: Spiriva, Albuterol inhaler (bring to surgery), Amlodipine, Cymbalta, Pepcid, Metoprolol, Synthroid, Ultram if needed   Do not wear jewelry, make-up or nail polish.  Do not wear lotions, powders, or perfumes. You may wear deodorant.  Do not shave 48 hours prior to surgery.  Do not bring valuables to the hospital.  Contacts, dentures or bridgework may not be worn into surgery.  Leave suitcase in the car. After surgery it may be brought to your room.  For patients admitted to the hospital, checkout time is 11:00 AM the day of discharge.   Patients discharged the day of surgery will not be allowed to drive home.  Name and phone number of your driver: NA  Special Instructions: Incentive Spirometry - Practice and bring it with you on the day of surgery. and CHG Shower Use Special Wash: 1/2 bottle night before surgery and 1/2 bottle morning of surgery.   Please read over the following fact sheets that you were given: Pain Booklet, Coughing and Deep Breathing, Blood Transfusion Information, Total Joint Packet and Surgical Site Infection Prevention

## 2011-04-24 ENCOUNTER — Inpatient Hospital Stay (HOSPITAL_COMMUNITY)
Admission: RE | Admit: 2011-04-24 | Discharge: 2011-04-27 | DRG: 470 | Disposition: A | Discharge status: Skilled Nursing Facility | Payer: Medicare Other | Source: Ambulatory Visit | Attending: Orthopedic Surgery | Admitting: Orthopedic Surgery

## 2011-04-24 ENCOUNTER — Encounter (HOSPITAL_COMMUNITY): Payer: Self-pay | Admitting: Anesthesiology

## 2011-04-24 ENCOUNTER — Ambulatory Visit (HOSPITAL_COMMUNITY): Payer: Medicare Other

## 2011-04-24 ENCOUNTER — Encounter (HOSPITAL_COMMUNITY)
Admission: RE | Disposition: A | Discharge status: Skilled Nursing Facility | Payer: Self-pay | Source: Ambulatory Visit | Attending: Orthopedic Surgery

## 2011-04-24 ENCOUNTER — Encounter (HOSPITAL_COMMUNITY): Payer: Self-pay

## 2011-04-24 ENCOUNTER — Ambulatory Visit (HOSPITAL_COMMUNITY): Payer: Medicare Other | Admitting: Anesthesiology

## 2011-04-24 DIAGNOSIS — N189 Chronic kidney disease, unspecified: Secondary | ICD-10-CM | POA: Diagnosis present

## 2011-04-24 DIAGNOSIS — M1610 Unilateral primary osteoarthritis, unspecified hip: Principal | ICD-10-CM | POA: Diagnosis present

## 2011-04-24 DIAGNOSIS — J4489 Other specified chronic obstructive pulmonary disease: Secondary | ICD-10-CM | POA: Diagnosis present

## 2011-04-24 DIAGNOSIS — Z01810 Encounter for preprocedural cardiovascular examination: Secondary | ICD-10-CM

## 2011-04-24 DIAGNOSIS — Z01818 Encounter for other preprocedural examination: Secondary | ICD-10-CM

## 2011-04-24 DIAGNOSIS — K219 Gastro-esophageal reflux disease without esophagitis: Secondary | ICD-10-CM | POA: Diagnosis present

## 2011-04-24 DIAGNOSIS — D62 Acute posthemorrhagic anemia: Secondary | ICD-10-CM | POA: Diagnosis not present

## 2011-04-24 DIAGNOSIS — F341 Dysthymic disorder: Secondary | ICD-10-CM | POA: Diagnosis present

## 2011-04-24 DIAGNOSIS — E039 Hypothyroidism, unspecified: Secondary | ICD-10-CM | POA: Diagnosis present

## 2011-04-24 DIAGNOSIS — M1611 Unilateral primary osteoarthritis, right hip: Secondary | ICD-10-CM | POA: Diagnosis present

## 2011-04-24 DIAGNOSIS — R634 Abnormal weight loss: Secondary | ICD-10-CM | POA: Diagnosis present

## 2011-04-24 DIAGNOSIS — M169 Osteoarthritis of hip, unspecified: Secondary | ICD-10-CM

## 2011-04-24 DIAGNOSIS — I129 Hypertensive chronic kidney disease with stage 1 through stage 4 chronic kidney disease, or unspecified chronic kidney disease: Secondary | ICD-10-CM | POA: Diagnosis present

## 2011-04-24 DIAGNOSIS — Z87891 Personal history of nicotine dependence: Secondary | ICD-10-CM

## 2011-04-24 DIAGNOSIS — M48061 Spinal stenosis, lumbar region without neurogenic claudication: Secondary | ICD-10-CM | POA: Diagnosis present

## 2011-04-24 DIAGNOSIS — Z01811 Encounter for preprocedural respiratory examination: Secondary | ICD-10-CM

## 2011-04-24 DIAGNOSIS — J449 Chronic obstructive pulmonary disease, unspecified: Secondary | ICD-10-CM | POA: Diagnosis present

## 2011-04-24 DIAGNOSIS — Z01812 Encounter for preprocedural laboratory examination: Secondary | ICD-10-CM

## 2011-04-24 HISTORY — PX: TOTAL HIP ARTHROPLASTY: SHX124

## 2011-04-24 SURGERY — ARTHROPLASTY, HIP, TOTAL,POSTERIOR APPROACH
Anesthesia: General | Site: Hip | Laterality: Right | Wound class: Clean

## 2011-04-24 MED ORDER — LACTATED RINGERS IV SOLN
INTRAVENOUS | Status: DC | PRN
  Administered 2011-04-24 (×2): via INTRAVENOUS

## 2011-04-24 MED ORDER — ZOLPIDEM TARTRATE 5 MG PO TABS: 5.0000 mg | ORAL_TABLET | Freq: Every evening | ORAL | Status: DC | PRN

## 2011-04-24 MED ORDER — AMLODIPINE BESYLATE 5 MG PO TABS
5.0000 mg | ORAL_TABLET | Freq: Every day | ORAL | Status: DC
  Administered 2011-04-26 – 2011-04-27 (×2): 5 mg via ORAL
  Filled 2011-04-24 (×3): qty 1

## 2011-04-24 MED ORDER — ONDANSETRON HCL 4 MG/2ML IJ SOLN
INTRAMUSCULAR | Status: DC | PRN
  Administered 2011-04-24: 4 mg via INTRAVENOUS

## 2011-04-24 MED ORDER — BISACODYL 10 MG RE SUPP: 10.0000 mg | Freq: Every day | RECTAL | Status: DC | PRN

## 2011-04-24 MED ORDER — WARFARIN VIDEO
Freq: Once | Status: AC
  Administered 2011-04-25: 10:00:00

## 2011-04-24 MED ORDER — ONDANSETRON HCL 4 MG/2ML IJ SOLN: 4.0000 mg | Freq: Four times a day (QID) | INTRAMUSCULAR | Status: DC | PRN

## 2011-04-24 MED ORDER — FENTANYL CITRATE 0.05 MG/ML IJ SOLN
INTRAMUSCULAR | Status: DC | PRN
  Administered 2011-04-24 (×2): 50 ug via INTRAVENOUS
  Administered 2011-04-24: 25 ug via INTRAVENOUS
  Administered 2011-04-24 (×5): 50 ug via INTRAVENOUS

## 2011-04-24 MED ORDER — BUPIVACAINE-EPINEPHRINE 0.5% -1:200000 IJ SOLN
INTRAMUSCULAR | Status: DC | PRN
  Administered 2011-04-24: 10 mL

## 2011-04-24 MED ORDER — METHOCARBAMOL 100 MG/ML IJ SOLN
500.0000 mg | Freq: Four times a day (QID) | INTRAMUSCULAR | Status: DC | PRN
  Administered 2011-04-24: 500 mg via INTRAVENOUS
  Filled 2011-04-24: qty 5

## 2011-04-24 MED ORDER — ENOXAPARIN SODIUM 40 MG/0.4ML ~~LOC~~ SOLN
40.0000 mg | SUBCUTANEOUS | Status: DC
  Administered 2011-04-25 – 2011-04-27 (×3): 40 mg via SUBCUTANEOUS
  Filled 2011-04-24 (×4): qty 0.4

## 2011-04-24 MED ORDER — LIDOCAINE HCL (CARDIAC) 20 MG/ML IV SOLN
INTRAVENOUS | Status: DC | PRN
  Administered 2011-04-24: 50 mg via INTRAVENOUS

## 2011-04-24 MED ORDER — FAMOTIDINE 40 MG PO TABS
40.0000 mg | ORAL_TABLET | Freq: Every day | ORAL | Status: DC
  Administered 2011-04-24 – 2011-04-27 (×4): 40 mg via ORAL
  Filled 2011-04-24 (×4): qty 1

## 2011-04-24 MED ORDER — SODIUM CHLORIDE 0.9 % IR SOLN
Status: DC | PRN
  Administered 2011-04-24: 1000 mL

## 2011-04-24 MED ORDER — POLYETHYLENE GLYCOL 3350 17 G PO PACK
17.0000 g | PACK | Freq: Every day | ORAL | Status: DC | PRN
  Filled 2011-04-24: qty 1

## 2011-04-24 MED ORDER — PROMETHAZINE HCL 25 MG/ML IJ SOLN: 6.2500 mg | INTRAMUSCULAR | Status: DC | PRN

## 2011-04-24 MED ORDER — ACETAMINOPHEN 325 MG PO TABS: 650.0000 mg | ORAL_TABLET | Freq: Four times a day (QID) | ORAL | Status: DC | PRN

## 2011-04-24 MED ORDER — ALUM & MAG HYDROXIDE-SIMETH 200-200-20 MG/5ML PO SUSP: 30.0000 mL | ORAL | Status: DC | PRN

## 2011-04-24 MED ORDER — METOCLOPRAMIDE HCL 5 MG/ML IJ SOLN
5.0000 mg | Freq: Three times a day (TID) | INTRAMUSCULAR | Status: DC | PRN
  Filled 2011-04-24: qty 2

## 2011-04-24 MED ORDER — HEMAX 150-1 MG PO TABS: 1.0000 | ORAL_TABLET | Freq: Every day | ORAL | Status: DC

## 2011-04-24 MED ORDER — MIDAZOLAM HCL 2 MG/2ML IJ SOLN: 1.0000 mg | INTRAMUSCULAR | Status: DC | PRN

## 2011-04-24 MED ORDER — CALCIUM CARBONATE 1250 (500 CA) MG PO TABS
1.0000 | ORAL_TABLET | Freq: Every day | ORAL | Status: DC
  Administered 2011-04-25 – 2011-04-27 (×3): 500 mg via ORAL
  Filled 2011-04-24 (×3): qty 1

## 2011-04-24 MED ORDER — ACETAMINOPHEN 10 MG/ML IV SOLN
INTRAVENOUS | Status: AC
  Filled 2011-04-24: qty 100

## 2011-04-24 MED ORDER — PHENOL 1.4 % MT LIQD
1.0000 | OROMUCOSAL | Status: DC | PRN
  Filled 2011-04-24: qty 177

## 2011-04-24 MED ORDER — DIPHENHYDRAMINE HCL 12.5 MG/5ML PO ELIX
12.5000 mg | ORAL_SOLUTION | ORAL | Status: DC | PRN
  Administered 2011-04-24: 12.5 mg via ORAL
  Filled 2011-04-24: qty 10

## 2011-04-24 MED ORDER — FENTANYL CITRATE 0.05 MG/ML IJ SOLN: 25.0000 ug | INTRAMUSCULAR | Status: DC | PRN

## 2011-04-24 MED ORDER — DOCUSATE SODIUM 100 MG PO CAPS: 100.0000 mg | ORAL_CAPSULE | Freq: Every day | ORAL | Status: DC | PRN

## 2011-04-24 MED ORDER — FE FUMARATE-B12-VIT C-FA-IFC PO CAPS
1.0000 | ORAL_CAPSULE | Freq: Three times a day (TID) | ORAL | Status: DC
  Administered 2011-04-25 – 2011-04-27 (×6): 1 via ORAL
  Filled 2011-04-24 (×10): qty 1

## 2011-04-24 MED ORDER — WARFARIN SODIUM 2.5 MG PO TABS
2.5000 mg | ORAL_TABLET | Freq: Once | ORAL | Status: AC
  Administered 2011-04-24: 2.5 mg via ORAL
  Filled 2011-04-24: qty 1

## 2011-04-24 MED ORDER — FLEET ENEMA 7-19 GM/118ML RE ENEM: 1.0000 | ENEMA | Freq: Once | RECTAL | Status: AC | PRN

## 2011-04-24 MED ORDER — CEFUROXIME SODIUM 1.5 G IJ SOLR
INTRAMUSCULAR | Status: DC | PRN
  Administered 2011-04-24: 1.5 g

## 2011-04-24 MED ORDER — MENTHOL 3 MG MT LOZG
1.0000 | LOZENGE | OROMUCOSAL | Status: DC | PRN
  Filled 2011-04-24: qty 9

## 2011-04-24 MED ORDER — ACETAMINOPHEN 650 MG RE SUPP: 650.0000 mg | Freq: Four times a day (QID) | RECTAL | Status: DC | PRN

## 2011-04-24 MED ORDER — NEOSTIGMINE METHYLSULFATE 1 MG/ML IJ SOLN
INTRAMUSCULAR | Status: DC | PRN
  Administered 2011-04-24: 3 mg via INTRAVENOUS

## 2011-04-24 MED ORDER — HYDROMORPHONE HCL PF 1 MG/ML IJ SOLN
INTRAMUSCULAR | Status: AC
  Filled 2011-04-24: qty 1

## 2011-04-24 MED ORDER — KCL IN DEXTROSE-NACL 20-5-0.45 MEQ/L-%-% IV SOLN
INTRAVENOUS | Status: DC
  Administered 2011-04-24 – 2011-04-25 (×2): via INTRAVENOUS
  Administered 2011-04-26: 1000 mL via INTRAVENOUS
  Administered 2011-04-26: 125 mL via INTRAVENOUS
  Filled 2011-04-24 (×10): qty 1000

## 2011-04-24 MED ORDER — KCL IN DEXTROSE-NACL 20-5-0.45 MEQ/L-%-% IV SOLN
INTRAVENOUS | Status: AC
  Filled 2011-04-24: qty 1000

## 2011-04-24 MED ORDER — ONDANSETRON HCL 4 MG PO TABS: 4.0000 mg | ORAL_TABLET | Freq: Four times a day (QID) | ORAL | Status: DC | PRN

## 2011-04-24 MED ORDER — METOCLOPRAMIDE HCL 10 MG PO TABS: 5.0000 mg | ORAL_TABLET | Freq: Three times a day (TID) | ORAL | Status: DC | PRN

## 2011-04-24 MED ORDER — DULOXETINE HCL 60 MG PO CPEP
60.0000 mg | ORAL_CAPSULE | Freq: Every day | ORAL | Status: DC
  Administered 2011-04-25 – 2011-04-27 (×3): 60 mg via ORAL
  Filled 2011-04-24 (×3): qty 1

## 2011-04-24 MED ORDER — LEVOTHYROXINE SODIUM 88 MCG PO TABS
88.0000 ug | ORAL_TABLET | Freq: Every day | ORAL | Status: DC
Start: 2011-04-25 — End: 2011-04-27
  Administered 2011-04-25 – 2011-04-27 (×3): 88 ug via ORAL
  Filled 2011-04-24 (×3): qty 1

## 2011-04-24 MED ORDER — HYDROCODONE-ACETAMINOPHEN 5-325 MG PO TABS
1.0000 | ORAL_TABLET | ORAL | Status: DC | PRN
  Administered 2011-04-25 – 2011-04-26 (×4): 2 via ORAL
  Filled 2011-04-24 (×4): qty 2

## 2011-04-24 MED ORDER — METOPROLOL SUCCINATE ER 50 MG PO TB24
50.0000 mg | ORAL_TABLET | Freq: Two times a day (BID) | ORAL | Status: DC
  Administered 2011-04-24 – 2011-04-27 (×3): 50 mg via ORAL
  Filled 2011-04-24 (×7): qty 1

## 2011-04-24 MED ORDER — ROCURONIUM BROMIDE 100 MG/10ML IV SOLN
INTRAVENOUS | Status: DC | PRN
  Administered 2011-04-24: 10 mg via INTRAVENOUS
  Administered 2011-04-24: 30 mg via INTRAVENOUS

## 2011-04-24 MED ORDER — METHOCARBAMOL 500 MG PO TABS
500.0000 mg | ORAL_TABLET | Freq: Four times a day (QID) | ORAL | Status: DC | PRN
  Administered 2011-04-24 – 2011-04-27 (×2): 500 mg via ORAL
  Filled 2011-04-24 (×2): qty 1

## 2011-04-24 MED ORDER — HYDROMORPHONE HCL PF 1 MG/ML IJ SOLN
0.5000 mg | INTRAMUSCULAR | Status: DC | PRN
  Administered 2011-04-24 (×4): 0.5 mg via INTRAVENOUS
  Administered 2011-04-25: 1 mg via INTRAVENOUS
  Filled 2011-04-24: qty 1

## 2011-04-24 MED ORDER — FENTANYL CITRATE 0.05 MG/ML IJ SOLN: 50.0000 ug | INTRAMUSCULAR | Status: DC | PRN

## 2011-04-24 MED ORDER — COUMADIN BOOK
Freq: Once | Status: AC
  Administered 2011-04-25: 10:00:00
  Filled 2011-04-24: qty 1

## 2011-04-24 MED ORDER — CALCIUM CARBONATE 600 MG PO TABS: 600.0000 mg | ORAL_TABLET | Freq: Every day | ORAL | Status: DC

## 2011-04-24 MED ORDER — GLYCOPYRROLATE 0.2 MG/ML IJ SOLN
INTRAMUSCULAR | Status: DC | PRN
  Administered 2011-04-24: .4 mg via INTRAVENOUS

## 2011-04-24 MED ORDER — OXYCODONE HCL 5 MG PO TABS
5.0000 mg | ORAL_TABLET | ORAL | Status: DC | PRN
  Administered 2011-04-25 – 2011-04-26 (×2): 10 mg via ORAL
  Administered 2011-04-27: 5 mg via ORAL
  Filled 2011-04-24: qty 2
  Filled 2011-04-24: qty 1
  Filled 2011-04-24: qty 2

## 2011-04-24 MED ORDER — PHENYLEPHRINE HCL 10 MG/ML IJ SOLN
INTRAMUSCULAR | Status: DC | PRN
  Administered 2011-04-24: 40 ug via INTRAVENOUS
  Administered 2011-04-24 (×4): 80 ug via INTRAVENOUS

## 2011-04-24 MED ORDER — ACETAMINOPHEN 10 MG/ML IV SOLN
INTRAVENOUS | Status: DC | PRN
  Administered 2011-04-24: 1000 mg via INTRAVENOUS

## 2011-04-24 MED ORDER — TIOTROPIUM BROMIDE MONOHYDRATE 18 MCG IN CAPS
18.0000 ug | ORAL_CAPSULE | Freq: Every day | RESPIRATORY_TRACT | Status: DC
  Filled 2011-04-24: qty 5

## 2011-04-24 MED ORDER — ALBUTEROL SULFATE HFA 108 (90 BASE) MCG/ACT IN AERS
2.0000 | INHALATION_SPRAY | RESPIRATORY_TRACT | Status: DC | PRN
  Filled 2011-04-24: qty 6.7

## 2011-04-24 MED ORDER — TIOTROPIUM BROMIDE MONOHYDRATE 18 MCG IN CAPS
18.0000 ug | ORAL_CAPSULE | Freq: Every day | RESPIRATORY_TRACT | Status: DC
  Administered 2011-04-25 – 2011-04-27 (×3): 18 ug via RESPIRATORY_TRACT

## 2011-04-24 MED ORDER — PROPOFOL 10 MG/ML IV EMUL
INTRAVENOUS | Status: DC | PRN
  Administered 2011-04-24: 20 mg via INTRAVENOUS
  Administered 2011-04-24: 100 mg via INTRAVENOUS

## 2011-04-24 SURGICAL SUPPLY — 57 items
BLADE SAW SAG 73X25 THK (BLADE) ×1
BLADE SAW SGTL 18X1.27X75 (BLADE) ×1 IMPLANT
BLADE SAW SGTL 73X25 THK (BLADE) ×1 IMPLANT
BLADE SAW SGTL MED 73X18.5 STR (BLADE) IMPLANT
BRUSH FEMORAL CANAL (MISCELLANEOUS) IMPLANT
CEMENT BONE DEPUY (Cement) ×2 IMPLANT
CEMENT RESTRICTOR DEPUY SZ 3 (Cement) ×1 IMPLANT
CLOTH BEACON ORANGE TIMEOUT ST (SAFETY) ×2 IMPLANT
COVER BACK TABLE 24X17X13 BIG (DRAPES) IMPLANT
COVER SURGICAL LIGHT HANDLE (MISCELLANEOUS) ×4 IMPLANT
DRAPE ORTHO SPLIT 77X108 STRL (DRAPES) ×2
DRAPE PROXIMA HALF (DRAPES) ×2 IMPLANT
DRAPE SURG ORHT 6 SPLT 77X108 (DRAPES) ×1 IMPLANT
DRAPE U-SHAPE 47X51 STRL (DRAPES) ×2 IMPLANT
DRILL BIT 7/64X5 (BIT) ×2 IMPLANT
DRSG MEPILEX BORDER 4X12 (GAUZE/BANDAGES/DRESSINGS) ×2 IMPLANT
DRSG MEPILEX BORDER 4X8 (GAUZE/BANDAGES/DRESSINGS) ×1 IMPLANT
DURAPREP 26ML APPLICATOR (WOUND CARE) ×2 IMPLANT
ELECT BLADE 4.0 EZ CLEAN MEGAD (MISCELLANEOUS) ×2
ELECT REM PT RETURN 9FT ADLT (ELECTROSURGICAL) ×2
ELECTRODE BLDE 4.0 EZ CLN MEGD (MISCELLANEOUS) IMPLANT
ELECTRODE REM PT RTRN 9FT ADLT (ELECTROSURGICAL) ×1 IMPLANT
FLOSEAL 10ML (HEMOSTASIS) IMPLANT
GAUZE XEROFORM 1X8 LF (GAUZE/BANDAGES/DRESSINGS) ×2 IMPLANT
GLOVE BIO SURGEON STRL SZ7 (GLOVE) ×2 IMPLANT
GLOVE BIO SURGEON STRL SZ7.5 (GLOVE) ×2 IMPLANT
GLOVE BIOGEL PI IND STRL 7.0 (GLOVE) ×1 IMPLANT
GLOVE BIOGEL PI IND STRL 8 (GLOVE) ×1 IMPLANT
GLOVE BIOGEL PI INDICATOR 7.0 (GLOVE) ×1
GLOVE BIOGEL PI INDICATOR 8 (GLOVE) ×1
GOWN PREVENTION PLUS XLARGE (GOWN DISPOSABLE) ×2 IMPLANT
GOWN STRL NON-REIN LRG LVL3 (GOWN DISPOSABLE) ×4 IMPLANT
HANDPIECE INTERPULSE COAX TIP (DISPOSABLE)
HOOD PEEL AWAY FACE SHEILD DIS (HOOD) ×4 IMPLANT
KIT BASIN OR (CUSTOM PROCEDURE TRAY) ×2 IMPLANT
KIT ROOM TURNOVER OR (KITS) ×2 IMPLANT
MANIFOLD NEPTUNE II (INSTRUMENTS) ×2 IMPLANT
NEEDLE 22X1 1/2 (OR ONLY) (NEEDLE) ×2 IMPLANT
NS IRRIG 1000ML POUR BTL (IV SOLUTION) ×2 IMPLANT
PACK TOTAL JOINT (CUSTOM PROCEDURE TRAY) ×2 IMPLANT
PAD ARMBOARD 7.5X6 YLW CONV (MISCELLANEOUS) ×4 IMPLANT
PASSER SUT SWANSON 36MM LOOP (INSTRUMENTS) ×2 IMPLANT
PRESSURIZER FEMORAL UNIV (MISCELLANEOUS) IMPLANT
SET HNDPC FAN SPRY TIP SCT (DISPOSABLE) IMPLANT
SPONGE LAP 18X18 X RAY DECT (DISPOSABLE) ×1 IMPLANT
SUT ETHIBOND 2 V 37 (SUTURE) ×2 IMPLANT
SUT ETHILON 3 0 FSL (SUTURE) ×2 IMPLANT
SUT VIC AB 0 CTB1 27 (SUTURE) ×2 IMPLANT
SUT VIC AB 1 CTX 36 (SUTURE) ×2
SUT VIC AB 1 CTX36XBRD ANBCTR (SUTURE) ×1 IMPLANT
SUT VIC AB 2-0 CTB1 (SUTURE) ×2 IMPLANT
SYR CONTROL 10ML LL (SYRINGE) ×2 IMPLANT
TOWEL OR 17X24 6PK STRL BLUE (TOWEL DISPOSABLE) ×2 IMPLANT
TOWEL OR 17X26 10 PK STRL BLUE (TOWEL DISPOSABLE) ×2 IMPLANT
TOWER CARTRIDGE SMART MIX (DISPOSABLE) ×1 IMPLANT
TRAY FOLEY CATH 14FR (SET/KITS/TRAYS/PACK) ×1 IMPLANT
WATER STERILE IRR 1000ML POUR (IV SOLUTION) ×8 IMPLANT

## 2011-04-24 NOTE — Anesthesia Procedure Notes (Signed)
Procedure Name: Intubation Date/Time: 04/24/2011 1:19 PM Performed by: Romie Minus Pre-anesthesia Checklist: Patient identified, Emergency Drugs available, Suction available and Patient being monitored Patient Re-evaluated:Patient Re-evaluated prior to inductionOxygen Delivery Method: Circle System Utilized Preoxygenation: Pre-oxygenation with 100% oxygen Intubation Type: IV induction Ventilation: Mask ventilation without difficulty Laryngoscope Size: Miller and 2 Grade View: Grade I Tube type: Oral Tube size: 7.0 mm Number of attempts: 1 Placement Confirmation: ETT inserted through vocal cords under direct vision,  positive ETCO2 and breath sounds checked- equal and bilateral Secured at: 21 cm Tube secured with: Tape Dental Injury: Teeth and Oropharynx as per pre-operative assessment

## 2011-04-24 NOTE — Anesthesia Postprocedure Evaluation (Signed)
  Anesthesia Post-op Note  Patient: [name]  Procedure(s) Performed: Procedure(s) (LRB): TOTAL HIP ARTHROPLASTY (Right)  Patient Location: PACU  Anesthesia Type: General  Level of Consciousness: awake and alert   Airway and Oxygen Therapy: Patient Spontanous Breathing  Post-op Pain: mild  Post-op Assessment: Post-op Vital signs reviewed, Patient's Cardiovascular Status Stable, Respiratory Function Stable, Patent Airway, No signs of Nausea or vomiting and Pain level controlled  Post-op Vital Signs: stable  Complications: No apparent anesthesia complications

## 2011-04-24 NOTE — Preoperative (Signed)
Beta Blockers   Reason not to administer Beta Blockers:Not Applicable 

## 2011-04-24 NOTE — Consult Note (Signed)
ANTICOAGULATION CONSULT NOTE - Initial Consult  Pharmacy Consult for Coumadin Indication: VTE prophylaxis  Allergies  Allergen Reactions  . Avelox (Moxifloxacin Hcl In Nacl) Other (See Comments)    Flushes face  . Nsaids     Does not take due to kidney disease    Patient Measurements: Height: 5' (152.4 cm) Weight: 105 lb (47.628 kg) IBW/kg (Calculated) : 45.5   Vital Signs: Temp: 97.8 F (36.6 C) (02/20 1817) Temp src: Oral (02/20 1136) BP: 104/48 mmHg (02/20 1817) Pulse Rate: 101  (02/20 1817)  Labs:  Basename 04/23/11 1233  HGB 10.4*  HCT 31.7*  PLT 395  APTT 40*  LABPROT 14.6  INR 1.12  HEPARINUNFRC --  CREATININE 1.13*  CKTOTAL --  CKMB --  TROPONINI --   Estimated Creatinine Clearance: 28.5 ml/min (by C-G formula based on Cr of 1.13).  Medical / Surgical History: Past Medical History  Diagnosis Date  . Hypertension   . Abnormal heart rhythm   . COPD (chronic obstructive pulmonary disease)   . Kidney disease   . Shortness of breath   . Hypothyroidism   . GERD (gastroesophageal reflux disease)   . Arthritis   . Weight loss     10-12 lbs in 2 months (03/2011)   Past Surgical History  Procedure Date  . Tonsillectomy   . Thyroidectomy     Left  . Esophagogastroduodenoscopy   . Colonoscopy   . Anterior and posterior vaginal repair   . Cardiac catheterization     6 years ago    Medications:  Prescriptions prior to admission  Medication Sig Dispense Refill  . acetaminophen (TYLENOL) 500 MG tablet Take 1,000 mg by mouth every 8 (eight) hours as needed. Take with tramadol      . amLODipine (NORVASC) 5 MG tablet Take 1 tablet by mouth daily.      Marland Kitchen aspirin 81 MG tablet Take 81 mg by mouth daily.        . calcium carbonate (OS-CAL) 600 MG TABS Take 600 mg by mouth daily.      . CYMBALTA 60 MG capsule Take 1 tablet by mouth daily.      Marland Kitchen docusate sodium (COLACE) 100 MG capsule Take 100 mg by mouth daily as needed. For constipation      . famotidine  (PEPCID) 40 MG tablet Take 1 tablet by mouth daily.      . Iron-DSS-B12-FA-C-E-Cu-Biotin (HEMAX) 150-1 MG TABS Take 1 tablet by mouth daily.      . metoprolol (TOPROL-XL) 50 MG 24 hr tablet Take 50 mg by mouth 2 (two) times daily.       . Multiple Vitamin (MULTIVITAMIN) tablet Take 1 tablet by mouth daily.        Marland Kitchen SYNTHROID 88 MCG tablet Take 1 tablet by mouth daily.      Marland Kitchen tiotropium (SPIRIVA) 18 MCG inhalation capsule Place 18 mcg into inhaler and inhale daily.      . traMADol (ULTRAM) 50 MG tablet Take 50 mg by mouth every 6 (six) hours as needed. Maximum dose= 8 tablets per day      . albuterol (PROVENTIL HFA;VENTOLIN HFA) 108 (90 BASE) MCG/ACT inhaler Inhale 2 puffs into the lungs every 4 (four) hours as needed. For shortness of breath       Scheduled:    . amLODipine  5 mg Oral Daily  . calcium carbonate  1 tablet Oral Daily  .  ceFAZolin (ANCEF) IV  1 g Intravenous Once  . DULoxetine  60 mg Oral Daily  . enoxaparin  40 mg Subcutaneous Q24H  . famotidine  40 mg Oral Daily  . HYDROmorphone      . levothyroxine  88 mcg Oral Daily  . metoprolol succinate  50 mg Oral BID  . tiotropium  18 mcg Inhalation Daily  . DISCONTD: calcium carbonate  600 mg Oral Daily  . DISCONTD: chlorhexidine  60 mL Topical Once  . DISCONTD: dextrose 5 % and 0.45 % NaCl with KCl 20 mEq/L      . DISCONTD: HEMAX  1 tablet Oral Daily   Infusions:    . dextrose 5 % and 0.45 % NaCl with KCl 20 mEq/L    . DISCONTD: dextrose 5 % and 0.45% NaCl     Anti-infectives     Start     Dose/Rate Route Frequency Ordered Stop   04/24/11 1420   cefUROXime (ZINACEF) injection  Status:  Discontinued          As needed 04/24/11 1420 04/24/11 1509   04/24/11 1200   ceFAZolin (ANCEF) IVPB 1 g/50 mL premix     Comments: Pre-op      1 g 100 mL/hr over 30 Minutes Intravenous  Once 04/23/11 0855 04/24/11 1311          Assessment:  Patient is a 78 y/o female placed on Coumadin for VTE prophylaxis s/p R-THA.  Baseline  INR  1.12  Goal of Therapy:   INR  2 - 3   Plan:   Coumadin 2.5 mg today. Warf ED initiated--Coumadin Video and Booklet Daily INR, CBC.  Nyonna Hargrove, Elisha Headland, Pharm.D. 04/24/2011 7:00 PM

## 2011-04-24 NOTE — Anesthesia Preprocedure Evaluation (Addendum)
Anesthesia Evaluation  Patient identified by MRN, date of birth, ID band Patient awake    Reviewed: Allergy & Precautions, H&P , NPO status , Patient's Chart, lab work & pertinent test results, reviewed documented beta blocker date and time   History of Anesthesia Complications Negative for: history of anesthetic complications  Airway Mallampati: I TM Distance: >3 FB Neck ROM: Full    Dental   Pulmonary shortness of breath and with exertion, COPD   Pulmonary exam normal       Cardiovascular hypertension, Pt. on medications and Pt. on home beta blockers     Neuro/Psych Anxiety Depression    GI/Hepatic GERD-  Medicated and Controlled,  Endo/Other  Hypothyroidism   Renal/GU Elevated creatinine. 04/23/11 cr 1.13     Musculoskeletal   Abdominal Normal abdominal exam  (+)   Peds  Hematology   Anesthesia Other Findings   Reproductive/Obstetrics                          Anesthesia Physical Anesthesia Plan  ASA: II  Anesthesia Plan: General   Post-op Pain Management:    Induction: Intravenous  Airway Management Planned: Oral ETT  Additional Equipment:   Intra-op Plan:   Post-operative Plan: Extubation in OR  Informed Consent: I have reviewed the patients History and Physical, chart, labs and discussed the procedure including the risks, benefits and alternatives for the proposed anesthesia with the patient or authorized representative who has indicated his/her understanding and acceptance.     Plan Discussed with: CRNA and Surgeon  Anesthesia Plan Comments:         Anesthesia Quick Evaluation

## 2011-04-24 NOTE — Interval H&P Note (Signed)
History and Physical Interval Note:  04/24/2011 12:27 PM  [name]  has presented today for surgery, with the diagnosis of RIGHT HIP DEGENERATIVE JOINT DISEASE  The various methods of treatment have been discussed with the patient and family. After consideration of risks, benefits and other options for treatment, the patient has consented to  Procedure(s) (LRB): TOTAL HIP ARTHROPLASTY (Right) as a surgical intervention .  The patients' history has been reviewed, patient examined, no change in status, stable for surgery.  I have reviewed the patients' chart and labs.  Questions were answered to the patient's satisfaction.     Nestor Lewandowsky

## 2011-04-24 NOTE — Progress Notes (Signed)
Pt continues to fall asleep when left alone, desats to high 70's when sleeping but pt continually takes off Oxygen tubing, strongly states she is alive and fine and does not want the oxygen tubing on, will cont to attempt to get patient to cough/deep breath.

## 2011-04-24 NOTE — Transfer of Care (Signed)
Immediate Anesthesia Transfer of Care Note  Patient: [name]  Procedure(s) Performed: Procedure(s) (LRB): TOTAL HIP ARTHROPLASTY (Right)  Patient Location: PACU  Anesthesia Type: General  Level of Consciousness: awake  Airway & Oxygen Therapy: Patient Spontanous Breathing and Patient connected to nasal cannula oxygen  Post-op Assessment: Report given to PACU RN and Post -op Vital signs reviewed and stable  Post vital signs: stable  Complications: No apparent anesthesia complications

## 2011-04-24 NOTE — Op Note (Signed)
PATIENT ID:      [name]  MRN:     [id number] DOB/AGE:    Apr 30, 1928 / 78 y.o.       OPERATIVE REPORT    DATE OF PROCEDURE:  04/24/2011       PREOPERATIVE DIAGNOSIS:  RIGHT HIP DEGENERATIVE JOINT DISEASE                                                       Estimated Body mass index is 22.11 kg/(m^2) as calculated from the following:   Height as of 01/25/11: 5\' 0" (1.524 m).   Weight as of 01/25/11: 113 lb 3.2 oz(51.347 kg).     POSTOPERATIVE DIAGNOSIS:  RIGHT HIP DEGENERATIVE JOINT DISEASE                                                           PROCEDURE:  R total hip arthroplasty using a 52 mm DePuy Pinnacle  Cup, [employer], 10-degree polyethylene liner index superior  and posterior, a +0 36 mm metal head, a #2 Summit basic stem, cemented double batch of DePuy HV cement with 1500 mg of Zinacef, 10 mm centralizer and #3 central canal occluder   SURGEON: Indy Prestwood J    ASSISTANT:   Mauricia Area, PA-C  (present throughout entire procedure and necessary for timely completion of the procedure)   ANESTHESIA:  GET  BLOOD LOSS: 400  FLUID REPLACEMENT: 1500 crystalloid DRAINS: Foley Catheter  URINE OUTPUT: 300 COMPLICATIONS:  None    INDICATIONS FOR PROCEDURE: A 78 y.o. year-old with end-stage arthritis of the R hip for 1 years, x-rays show bone-on-bone arthritic changes. Despite conservative measures with observation, anti-inflammatory medicine, narcotics, use of a cane, has severe unremitting pain and can ambulate only 1 blocks before resting.  Patient desires elective right total hip arthroplasty to decrease pain and increase function. The risks, benefits, and alternatives were discussed at length including but not limited to the risks of infection, bleeding, nerve injury, stiffness, blood clots, the need for revision surgery, cardiopulmonary complications, among others, and they were willing to proceed.y have been discussed. Questions answered.     PROCEDURE IN  DETAIL: The patient was identified by armband,  received preoperative IV antibiotics in the holding area at [facility], taken to the operating room , appropriate anesthetic monitors  were attached and general endotracheal anesthesia induced. Foley catheter was inserted. She was rolled into the L lateral decubitus position and fixed there with a Stulberg Mark II pelvic clamp and the R lower extremity was then prepped and draped  in the usual sterile fashion from the ankle to the hemipelvis. A time-out  procedure was performed. The skin along the lateral hip and thigh  infiltrated with 10 mL of 0.5% Marcaine and epinephrine solution. We  then made a posterolateral approach to the hip. With a #10 blade, 12 cm  incision through skin and subcutaneous tissue down to the level of the  IT band. Small bleeders were identified and cauterized. IT band cut in  line with skin incision exposing the greater trochanter. A Cobra retractor was placed between the gluteus minimus  and the superior hip joint capsule, and a spiked Cobra between the quadratus femoris and the inferior hip joint capsule. This isolated the short  external rotators and piriformis tendons. These were tagged with a #2 Ethibond  suture and cut off their insertion on the intertrochanteric crest. The posterior  capsule was then developed into an acetabular-based flap from Posterior Superior off of the acetabulum out over the femoral neck and back posterior inferior to the acetabular rim. This flap was tagged with two #2 Ethibond sutures and retracted protecting the sciatic nerve. This exposed the arthritic femoral head and osteophytes. The hip was then flexed and internally rotated, dislocating the femoral head and a standard neck cut performed 1 fingerbreadth above the lesser trochanter.  A spiked Cobra was placed in the cotyloid notch and a Hohmann retractor was then used to lever the femur anteriorly off of the anterior pelvic column. A  posterior-inferior wing retractor was placed at the junction of the acetabulum and the ischium completing the acetabular exposure.We then removed the peripheral osteophytes and labrum from the acetabulum. We then reamed the acetabulum up to 51 mm with basket reamers obtaining good coverage in all quadrants, irrigated out with normal  saline solution and hammered into place a 52 mm pinnacle cup in 45  degrees of abduction and about 20 degrees of anteversion. More  peripheral osteophytes removed and a trial 10-degree liner placed with the  index superior-posterior. The hip was then flexed and internally rotated exposing the  proximal femur, which was entered with the initiating reamer followed by  the tapered reamers up to a 3-2 tapered reamer and broaching up to a #2 broach, which had good fit and fill. A trial reduction was then  performed with a +0  36-mm ball on the standard neck and  excellent stability was noted with at 90 of flexion with 75 of  internal rotation and then full extension withexternal rotation. The hip  could not be dislocated in full extension. The knee could easily flex  to about 140 degrees. We also stretched the abductors at this point,  because of the preexisting adductor contractures. All trial components  were then removed. The acetabulum was irrigated out with normal saline  solution. A titanium [facility] was then screwed into place  followed by a 10-degree polyethylene liner index superior-posterior. On  the femoral side, we then sized for a #3 cement restrictor and irrigated  out the femoral canal with normal saline solution and dried with suction  and sponges. The cement restrictor was placed to the appropriate depth. A  double batch of DePuy 1 cement with 1500 mg of Zinacef was mixed and  injected into the canal under pressure followed by #2 Summit basic stem  in 20 degrees of anteversion and as this was held in place, excess cement  was removed. At  this point, a +0 36-mm metal head was  hammered on the stem. The hip was reduced. We checked our stability  one more time and found to be excellent. The wound was once again  thoroughly irrigated out with normal saline solution pulse lavage. The  capsular flap and short external rotators were repaired back to the  intertrochanteric crest through drill holes with a #2 Ethibond suture.  The IT band was closed with running 1 Vicryl suture. The subcutaneous  tissue with 0 and 2-0 undyed Vicryl suture and the skin with running  interlocking 3-0 nylon suture. Dressing of Xeroform and Mepilex was  then  applied. The patient was then unclamped, rolled supine, awaken extubated and taken to recovery room without difficulty in stable condition.   Gean Birchwood J 04/24/2011, 2:42 PM

## 2011-04-25 LAB — CBC
HCT: 24.2 % — ABNORMAL LOW (ref 36.0–46.0)
Hemoglobin: 8 g/dL — ABNORMAL LOW (ref 12.0–15.0)
MCHC: 33.1 g/dL (ref 30.0–36.0)
MCV: 87.1 fL (ref 78.0–100.0)
RDW: 14 % (ref 11.5–15.5)

## 2011-04-25 LAB — BASIC METABOLIC PANEL
BUN: 13 mg/dL (ref 6–23)
Creatinine, Ser: 0.87 mg/dL (ref 0.50–1.10)
GFR calc Af Amer: 71 mL/min — ABNORMAL LOW (ref >90)
GFR calc non Af Amer: 61 mL/min — ABNORMAL LOW (ref >90)
Glucose, Bld: 166 mg/dL — ABNORMAL HIGH (ref 70–99)

## 2011-04-25 MED ORDER — WARFARIN 1.25 MG HALF TABLET
1.2500 mg | ORAL_TABLET | Freq: Once | ORAL | Status: AC
  Administered 2011-04-25: 1.25 mg via ORAL
  Filled 2011-04-25: qty 1

## 2011-04-25 NOTE — Progress Notes (Signed)
CARE MANAGEMENT NOTE 04/25/2011 Action/Plan:     Discharge planning. Patient is going to [facility] for shortterm rehab. Social Worker is aware.   Anticipated DC Date:  04/26/2011   Anticipated DC Plan:  SKILLED NURSING FACILITY  In-house referral  Clinical Social Worker      DC Planning Services  CM consult      [facility] Choice  NA   Choice offered to / List presented to:  NA   DME arranged  NA      DME agency  NA     HH arranged  NA      HH agency  NA   Status of service:  Completed, signed off

## 2011-04-25 NOTE — Progress Notes (Signed)
Clinical Social Work-CSW received referral for [facility] to be placed in shadow chart and CSW will follow for d/c planning-[name]-MSW- [phone]

## 2011-04-25 NOTE — Progress Notes (Signed)
OT Cancellation Note  Treatment cancelled today due to patient's refusal to participate.  [facility] Gabrial Domine, OTR/L  [phone] 04/25/2011 04/25/2011, 5:28 PM

## 2011-04-25 NOTE — Evaluation (Signed)
Physical Therapy Evaluation Patient Details Name: [name] MRN: [id number] DOB: 03/02/1929 Today's Date: 04/25/2011  Problem List:  Patient Active Problem List  Diagnoses  . COPD (chronic obstructive pulmonary disease)  . Aseptic necrosis    Past Medical History:  Past Medical History  Diagnosis Date  . Hypertension   . Abnormal heart rhythm   . COPD (chronic obstructive pulmonary disease)   . Kidney disease   . Shortness of breath   . Hypothyroidism   . GERD (gastroesophageal reflux disease)   . Arthritis   . Weight loss     10-12 lbs in 2 months (03/2011)   Past Surgical History:  Past Surgical History  Procedure Date  . Tonsillectomy   . Thyroidectomy     Left  . Esophagogastroduodenoscopy   . Colonoscopy   . Anterior and posterior vaginal repair   . Cardiac catheterization     6 years ago    PT Assessment/Plan/Recommendation PT Assessment Clinical Impression Statement: Pt. is s/p right THR with decreased understanding of precautions and safety, decreased mobility and gait.  Will need acute PT then SNF for rehab. PT Recommendation/Assessment: Patient will need skilled PT in the acute care venue PT Problem List: Decreased strength;Decreased activity tolerance;Decreased mobility;Decreased knowledge of use of DME;Decreased safety awareness;Decreased knowledge of precautions;Pain Barriers to Discharge: Decreased caregiver support PT Therapy Diagnosis : Difficulty walking;Acute pain PT Plan PT Frequency: 7X/week PT Treatment/Interventions: DME instruction;Gait training;Functional mobility training;Therapeutic activities;Therapeutic exercise;Patient/family education PT Recommendation Follow Up Recommendations: Skilled nursing facility Equipment Recommended: Defer to next venue PT Goals  Acute Rehab PT Goals PT Goal Formulation: With patient Time For Goal Achievement: 7 days Pt will go Supine/Side to Sit: with min assist PT Goal: Supine/Side to Sit - Progress:  Goal set today Pt will go Sit to Supine/Side: with min assist PT Goal: Sit to Supine/Side - Progress: Goal set today Pt will go Stand to Sit: with min assist PT Goal: Stand to Sit - Progress: Goal set today Pt will Ambulate: 16 - 50 feet;with min assist;with rolling walker PT Goal: Ambulate - Progress: Goal set today Additional Goals Additional Goal #1:  (Pt. will state and adhere to 3/3 posterior hip precautions) PT Goal: Additional Goal #1 - Progress: Goal set today  PT Evaluation Precautions/Restrictions  Precautions Precautions: Posterior Hip Precaution Booklet Issued: Yes (comment) Precaution Comments:  (educated pt. on 3/3 posterior hip precautions) Required Braces or Orthoses: No Restrictions Weight Bearing Restrictions: Yes RLE Weight Bearing: Weight bearing as tolerated Prior Functioning  Home Living Lives With: Alone Receives Help From: Neighbor Type of Home: House Home Layout: One level Home Access: Level entry Bathroom Shower/Tub: Tub/shower unit;Door [employer] Toilet: Standard Home Adaptive Equipment: Walker - rolling;Shower chair with back Prior Function Level of Independence: Independent with basic ADLs;Independent with homemaking with ambulation;Independent with gait;Independent with transfers;Requires assistive device for independence Able to Take Stairs?: Yes Driving: Yes Vocation: Retired Financial risk analyst Arousal/Alertness: Awake/alert Overall Cognitive Status: Appears within functional limits for tasks assessed (pt. easily becomes impatient and lashes out at staff) Orientation Level: Oriented X4 Sensation/Coordination Sensation Light Touch: Appears Intact (lower legs) Extremity Assessment RLE Assessment RLE Assessment:  (fair quad set, good ankle pump) LLE Assessment LLE Assessment: Within Functional Limits Mobility (including Balance) Bed Mobility Bed Mobility: Yes Supine to Sit: 3: Mod assist;With rails;HOB elevated (Comment degrees) Supine  to Sit Details (indicate cue type and reason): constant vc's for hip precautions and safe technique Sit to Supine: Not Tested (comment) Transfers Transfers: Yes Sit to  Stand: 1: +2 Total assist;Patient percentage (comment);With upper extremity assist;From bed (pt. 60%) Sit to Stand Details (indicate cue type and reason): vc's for safe technique Stand to Sit: 3: Mod assist;With armrests;To chair/3-in-1 Stand to Sit Details: vc's for safe technique Ambulation/Gait Ambulation/Gait: Yes Ambulation/Gait Assistance: 1: +2 Total assist;Patient percentage (comment) (pt. 60%) Ambulation/Gait Assistance Details (indicate cue type and reason): cues for sequence and technique Ambulation Distance (Feet): 2 Feet Assistive device: Rolling walker Gait Pattern: Step-to pattern Stairs: No    Exercise  Total Joint Exercises Ankle Circles/Pumps: AROM;Both;10 reps;Supine Quad Sets: AROM;Right;10 reps;Supine End of Session PT - End of Session Equipment Utilized During Treatment: Gait belt Activity Tolerance: Patient limited by pain Patient left: in chair;with call bell in reach Nurse Communication: Mobility status for transfers;Mobility status for ambulation;Weight bearing status General Behavior During Session: [facility] for tasks performed (easily becomes impatient) Cognition: WFL for tasks performed  Ferman Hamming 04/25/2011, 1:00 PM Acute Rehabilitation Services [phone] (pager)

## 2011-04-25 NOTE — Progress Notes (Addendum)
ANTICOAGULATION CONSULT NOTE - Follow Up Consult  Pharmacy Consult for Coumadin Indication: VTE prophylaxis  Allergies  Allergen Reactions  . Avelox (Moxifloxacin Hcl In Nacl) Other (See Comments)    Flushes face  . Nsaids     Does not take due to kidney disease    Patient Measurements: Height: 5' (152.4 cm) Weight: 105 lb (47.628 kg) IBW/kg (Calculated) : 45.5   Vital Signs: Temp: 97.3 F (36.3 C) (02/21 0650) BP: 90/48 mmHg (02/21 1203) Pulse Rate: 98  (02/21 0650)  Labs:  Basename 04/25/11 0705 04/23/11 1233  HGB 8.0* 10.4*  HCT 24.2* 31.7*  PLT 276 395  APTT -- 40*  LABPROT 17.7* 14.6  INR 1.43 1.12  HEPARINUNFRC -- --  CREATININE 0.87 1.13*  CKTOTAL -- --  CKMB -- --  TROPONINI -- --   Estimated Creatinine Clearance: 37 ml/min (by C-G formula based on Cr of 0.87).   Medications:  Scheduled:    . amLODipine  5 mg Oral Daily  . calcium carbonate  1 tablet Oral Daily  . coumadin book   Does not apply Once  . DULoxetine  60 mg Oral Daily  . enoxaparin  40 mg Subcutaneous Q24H  . famotidine  40 mg Oral Daily  . ferrous fumarate-b12-vitamic C-folic acid  1 capsule Oral TID PC  . HYDROmorphone      . levothyroxine  88 mcg Oral Daily  . metoprolol succinate  50 mg Oral BID  . tiotropium  18 mcg Inhalation Daily  . warfarin  2.5 mg Oral ONCE-1800  . warfarin   Does not apply Once  . DISCONTD: calcium carbonate  600 mg Oral Daily  . DISCONTD: chlorhexidine  60 mL Topical Once  . DISCONTD: dextrose 5 % and 0.45 % NaCl with KCl 20 mEq/L      . DISCONTD: HEMAX  1 tablet Oral Daily  . DISCONTD: tiotropium  18 mcg Inhalation Daily    Assessment: 78yo female s/p R-THA, on Coumadin for VTE px.  Significant increase in INR after 1st dose, to 1.43.  No bleeding problems noted.  Goal of Therapy:  INR 1.5-2   Plan:  1.  Coumadin 1.25mg  2.  F/U in AM  Maressa Apollo P 04/25/2011,1:20 PM

## 2011-04-25 NOTE — Progress Notes (Addendum)
PT TREATMENT NOTE  04/25/11 1343  PT Visit Information  Last PT Received On 04/25/11  Precautions  Precautions Posterior Hip  Required Braces or Orthoses No  Restrictions  Weight Bearing Restrictions Yes  RLE Weight Bearing WBAT  Bed Mobility  Bed Mobility No  Supine to Sit (pt. recently back to bed and declined to get back OOB)  Transfers  Transfers No  Ambulation/Gait  Ambulation/Gait No  Total Joint Exercises  Ankle Circles/Pumps AROM;Both;10 reps;Supine  Quad Sets AROM;Right;5 reps;Supine  Hip ABduction/ADduction AAROM;Right  PT - End of Session  Activity Tolerance Patient tolerated treatment well  Patient left in bed;with call bell in reach  General  Behavior During Session [facility] for tasks performed  Cognition [facility] for tasks performed  PT - Assessment/Plan  Comments on Treatment Session Pt. tolerated THR exs well but did not want to get back OOB for ambulation.  PT Plan Discharge plan remains appropriate  PT Frequency 7X/week  Follow Up Recommendations Skilled nursing facility  Equipment Recommended Defer to next venue    Weldon Picking PT Acute Rehab Services [phone] Beeper [phone]

## 2011-04-25 NOTE — Progress Notes (Signed)
Patient ID: [name], female   DOB: March 21, 1928, 78 y.o.   MRN: [id number] PATIENT ID: [name]  MRN: [id number]  DOB/AGE:  April 17, 1928 / 78 y.o.  1 Day Post-Op Procedure(s) (LRB): TOTAL HIP ARTHROPLASTY (Right)    PROGRESS NOTE Subjective: Patient is alert, oriented,noNausea, no Vomiting, yes passing gas, yes Bowel Movement. Taking PO well. Denies SOB, Chest or Calf Pain. Using Incentive Spirometer, PAS in place. Ambulate WBAT Patient reports pain as 2 on 0-10 scale Denies any weakness or dizziness .    Objective: Vital signs in last 24 hours: Filed Vitals:   04/24/11 2208 04/24/11 2218 04/25/11 0650 04/25/11 0700  BP: 127/101 123/85 108/50   Pulse: 110 97 98   Temp: 97.6 F (36.4 C)  97.3 F (36.3 C)   TempSrc: Oral     Resp: 18  18   Height:      Weight:      SpO2: 90%  100% 83%      Intake/Output from previous day: I/O last 3 completed shifts: In: 3175 [I.V.:3175] Out: 550 [Urine:350; Blood:200]   Intake/Output this shift:     LABORATORY DATA:  Basename 04/25/11 0705 04/23/11 1233  WBC 9.0 7.1  HGB 8.0* 10.4*  HCT 24.2* 31.7*  PLT 276 395  NA 136 135  K 4.6 4.0  CL 102 95*  CO2 28 28  BUN 13 26*  CREATININE 0.87 1.13*  GLUCOSE 166* 86  GLUCAP -- --  INR 1.43 1.12  CALCIUM 8.6 --    Examination: Neurologically intact ABD soft Neurovascular intact Sensation intact distally Intact pulses distally Dorsiflexion/Plantar flexion intact Incision: scant drainage No cellulitis present Compartment soft} XR AP&Lat of hip shows well placed\fixed THA  Assessment:   1 Day Post-Op Procedure(s) (LRB): TOTAL HIP ARTHROPLASTY (Right) ADDITIONAL DIAGNOSIS:  Acute Blood Loss Anemia, hgb 8.0. Pt started with chronic anemia hgb 10.4 pre-op, no cardiac hx.  Plan: PT/OT WBAT, THA  posterior precautions, monitor hgb, will transfuse if pt. Becomes symptomatic.  DVT Prophylaxis: Lovenox\Coumadin bridge, monitor INR 1.5-2.0 target  DISCHARGE PLAN: Skilled  Nursing Facility/Rehab  DISCHARGE NEEDS: HHPT, HHRN, CPM, Walker and 3-in-1 comode seat

## 2011-04-26 DIAGNOSIS — M1611 Unilateral primary osteoarthritis, right hip: Secondary | ICD-10-CM | POA: Diagnosis present

## 2011-04-26 LAB — CBC
HCT: 25 % — ABNORMAL LOW (ref 36.0–46.0)
MCH: 29 pg (ref 26.0–34.0)
MCV: 86.2 fL (ref 78.0–100.0)
Platelets: 286 10*3/uL (ref 150–400)
RBC: 2.9 MIL/uL — ABNORMAL LOW (ref 3.87–5.11)
RDW: 13.8 % (ref 11.5–15.5)
WBC: 11 10*3/uL — ABNORMAL HIGH (ref 4.0–10.5)

## 2011-04-26 MED ORDER — WARFARIN SODIUM 1 MG PO TABS
1.5000 mg | ORAL_TABLET | Freq: Once | ORAL | Status: AC
  Administered 2011-04-26: 1.5 mg via ORAL
  Filled 2011-04-26: qty 1

## 2011-04-26 MED ORDER — METHOCARBAMOL 500 MG PO TABS: 500.0000 mg | ORAL_TABLET | Freq: Four times a day (QID) | ORAL | Status: AC | PRN

## 2011-04-26 MED ORDER — ALPRAZOLAM 0.25 MG PO TABS
0.2500 mg | ORAL_TABLET | Freq: Two times a day (BID) | ORAL | Status: DC | PRN
  Administered 2011-04-26: 0.25 mg via ORAL
  Filled 2011-04-26: qty 1

## 2011-04-26 MED ORDER — OXYCODONE HCL 5 MG PO TABS: 5.0000 mg | ORAL_TABLET | ORAL | Status: AC | PRN

## 2011-04-26 MED ORDER — WARFARIN SODIUM 5 MG PO TABS: 5.0000 mg | ORAL_TABLET | Freq: Every day | ORAL | Status: DC

## 2011-04-26 MED ORDER — WHITE PETROLATUM GEL
Status: AC
  Administered 2011-04-26: 10:00:00
  Filled 2011-04-26: qty 5

## 2011-04-26 MED ORDER — SIMETHICONE 80 MG PO CHEW
80.0000 mg | CHEWABLE_TABLET | Freq: Four times a day (QID) | ORAL | Status: DC | PRN
  Filled 2011-04-26 (×2): qty 1

## 2011-04-26 NOTE — Progress Notes (Signed)
  PHYSICAL THERAPY NOTE  Pt. Declined PT this pm,stating I just don't want to do anything.  Will continue with PT tomorrow.  Weldon Picking PT Acute Rehab Services [phone] Beeper [phone]

## 2011-04-26 NOTE — Progress Notes (Addendum)
PT TREATMENT NOTE  04/26/11 0804  PT Visit Information  Last PT Received On 04/26/11  Precautions  Precautions Posterior Hip  Required Braces or Orthoses No  Restrictions  Weight Bearing Restrictions Yes  RLE Weight Bearing WBAT  Bed Mobility  Bed Mobility Yes  Supine to Sit 3: Mod assist;With rails;HOB elevated (Comment degrees)  Supine to Sit Details (indicate cue type and reason) vc's for safe technique  Sit to Supine Not Tested (comment)  Transfers  Transfers Yes  Sit to Stand 1: +2 Total assist;Patient percentage (comment);Other (comment) (pt. 70%, second person primarily to stabilize RW)  Stand to Sit 3: Mod assist;With armrests;To chair/3-in-1  Ambulation/Gait  Ambulation/Gait Yes  Ambulation/Gait Assistance 3: Mod assist;Other (comment) (second person to push recliner behin pt.)  Ambulation/Gait Assistance Details (indicate cue type and reason) vc's for technique and sequence  Ambulation Distance (Feet) 4 Feet  Assistive device Rolling walker  Gait Pattern Step-to pattern  Stairs No  Exercises  Exercises Total Joint  Total Joint Exercises  Ankle Circles/Pumps AROM;Both;10 reps (further exs limited due to pt. with alot of anxiety)  PT - End of Session  Equipment Utilized During Treatment Gait belt (bed pillow between knees once pt. sitting in recliner)  Activity Tolerance Patient tolerated treatment well (pt. with anxiety today)  Patient left in chair;with call bell in reach  Nurse Communication Mobility status for transfers;Mobility status for ambulation;Weight bearing status  General  Behavior During Session [facility] for tasks performed  Cognition [facility] for tasks performed  PT - Assessment/Plan  Comments on Treatment Session Pt. more anxious today.  Discussed with RN. Pt. continues with slow progress.  PT Plan Discharge plan remains appropriate  PT Frequency 7X/week  Follow Up Recommendations Skilled nursing facility  Equipment Recommended Defer to next venue    Acute Rehab PT Goals  PT Goal: Supine/Side to Sit - Progress Progressing toward goal  PT Goal: Stand to Sit - Progress Progressing toward goal  PT Goal: Ambulate - Progress Progressing toward goal    Weldon Picking PT Acute Rehab Services [phone] Beeper [phone]

## 2011-04-26 NOTE — Progress Notes (Signed)
ANTICOAGULATION CONSULT NOTE - Follow Up Consult  Pharmacy Consult for Coumadin Indication: VTE prophylaxis  Allergies  Allergen Reactions  . Avelox (Moxifloxacin Hcl In Nacl) Other (See Comments)    Flushes face  . Nsaids     Does not take due to kidney disease   Vital Signs: Temp: 98.8 F (37.1 C) (02/22 0501) Temp src: Oral (02/22 0501) BP: 109/72 mmHg (02/22 1400) Pulse Rate: 110  (02/22 1450)  Labs:  Basename 04/26/11 0610 04/25/11 0705  HGB 8.4* 8.0*  HCT 25.0* 24.2*  PLT 286 276  APTT -- --  LABPROT 17.8* 17.7*  INR 1.44 1.43  HEPARINUNFRC -- --  CREATININE -- 0.87  CKTOTAL -- --  CKMB -- --  TROPONINI -- --   Estimated Creatinine Clearance: 37 ml/min (by C-G formula based on Cr of 0.87).  Assessment: 82yof s/p right THA continues on coumadin for VTE prophylaxis. INR is almost therapeutic. No bleeding noted.  Goal of Therapy:  INR 1.5-2   Plan:  1) Increase coumadin 1.5mg  x 1 2) Follow up INR  Fredrik Rigger 04/26/2011,3:17 PM

## 2011-04-26 NOTE — Discharge Summary (Signed)
Patient ID: [name] MRN: [id number] DOB/AGE: September 26, 1928 78 y.o.  Admit date: 04/24/2011 Discharge date: 04/27/2011  Admission Diagnoses:  Active Problems:  Osteoarthritis of right hip   Discharge Diagnoses:  Same  Past Medical History  Diagnosis Date  . Hypertension   . Abnormal heart rhythm   . COPD (chronic obstructive pulmonary disease)   . Kidney disease   . Shortness of breath   . Hypothyroidism   . GERD (gastroesophageal reflux disease)   . Arthritis   . Weight loss     10-12 lbs in 2 months (03/2011)    Surgeries: Procedure(s): TOTAL HIP ARTHROPLASTY on 04/24/2011   Consultants:    Discharged Condition: Improved  Hospital Course: MAURISHA MONGEAU is an 79 y.o. female who was admitted 04/24/2011 for operative treatment of<principal problem not specified>. Patient has severe unremitting pain that affects sleep, daily activities, and work/hobbies. After pre-op clearance the patient was taken to the operating room on 04/24/2011 and underwent  Procedure(s): TOTAL HIP ARTHROPLASTY.    Patient was given perioperative antibiotics: Anti-infectives     Start     Dose/Rate Route Frequency Ordered Stop   04/24/11 1420   cefUROXime (ZINACEF) injection  Status:  Discontinued          As needed 04/24/11 1420 04/24/11 1509   04/24/11 1200   ceFAZolin (ANCEF) IVPB 1 g/50 mL premix     Comments: Pre-op      1 g 100 mL/hr over 30 Minutes Intravenous  Once 04/23/11 0855 04/24/11 1311           Patient was given sequential compression devices, early ambulation, and chemoprophylaxis to prevent DVT.  Patient benefited maximally from hospital stay and there were no complications.    Recent vital signs: Patient Vitals for the past 24 hrs:  BP Temp Temp src Pulse Resp SpO2  04/26/11 1034 123/70 mmHg - - - - -  04/26/11 0800 - - - - - 93 %  04/26/11 0501 124/65 mmHg 98.8 F (37.1 C) Oral 115  16  93 %  2011/05/01 2254 116/60 mmHg - - - - -  2011/05/01 2151 116/60 mmHg 98.6 F  (37 C) Oral 109  18  94 %  May 01, 2011 1500 100/56 mmHg 98 F (36.7 C) - 88  20  92 %     Recent laboratory studies:  Basename 04/26/11 0610 05/01/11 0705  WBC 11.0* 9.0  HGB 8.4* 8.0*  HCT 25.0* 24.2*  PLT 286 276  NA -- 136  K -- 4.6  CL -- 102  CO2 -- 28  BUN -- 13  CREATININE -- 0.87  GLUCOSE -- 166*  INR 1.44 1.43  CALCIUM -- 8.6     Discharge Medications:   Medication List  As of 04/26/2011 12:47 PM   STOP taking these medications         aspirin 81 MG tablet      traMADol 50 MG tablet         TAKE these medications         acetaminophen 500 MG tablet   Commonly known as: TYLENOL   Take 1,000 mg by mouth every 8 (eight) hours as needed. Take with tramadol      albuterol 108 (90 BASE) MCG/ACT inhaler   Commonly known as: PROVENTIL HFA;VENTOLIN HFA   Inhale 2 puffs into the lungs every 4 (four) hours as needed. For shortness of breath      amLODipine 5 MG tablet   Commonly known  as: NORVASC   Take 1 tablet by mouth daily.      calcium carbonate 600 MG Tabs   Commonly known as: OS-CAL   Take 600 mg by mouth daily.      CYMBALTA 60 MG capsule   Generic drug: DULoxetine   Take 1 tablet by mouth daily.      docusate sodium 100 MG capsule   Commonly known as: COLACE   Take 100 mg by mouth daily as needed. For constipation      famotidine 40 MG tablet   Commonly known as: PEPCID   Take 1 tablet by mouth daily.      HEMAX 150-1 MG Tabs   Take 1 tablet by mouth daily.      methocarbamol 500 MG tablet   Commonly known as: ROBAXIN   Take 1 tablet (500 mg total) by mouth every 6 (six) hours as needed.      metoprolol succinate 50 MG 24 hr tablet   Commonly known as: TOPROL-XL   Take 50 mg by mouth 2 (two) times daily.      multivitamin tablet   Take 1 tablet by mouth daily.      oxyCODONE 5 MG immediate release tablet   Commonly known as: Oxy IR/ROXICODONE   Take 1-2 tablets (5-10 mg total) by mouth every 3 (three) hours as needed.      SYNTHROID  88 MCG tablet   Generic drug: levothyroxine   Take 1 tablet by mouth daily.      tiotropium 18 MCG inhalation capsule   Commonly known as: SPIRIVA   Place 18 mcg into inhaler and inhale daily.      warfarin 5 MG tablet   Commonly known as: COUMADIN   Take 1 tablet (5 mg total) by mouth daily.            Diagnostic Studies: Dg Chest 2 View  04/23/2011  *RADIOLOGY REPORT*  Clinical Data: 78 year old female with hypertension.  Preoperative study for hip surgery.  CHEST - 2 VIEW  Comparison: Chest CT 12/24/2010 and earlier.  Findings: Scoliosis.  Stable lung volumes.  Stable cardiac size and mediastinal contours.  No pneumothorax, pulmonary edema, pleural effusion or acute pulmonary opacity.  Degenerative changes in the spine and exaggerated kyphosis. No acute osseous abnormality identified.  IMPRESSION: No acute cardiopulmonary abnormality.  Original Report Authenticated By: Harley Hallmark, M.D.   Dg Pelvis Portable  04/24/2011  *RADIOLOGY REPORT*  Clinical Data: 78 year old female status post right hip surgery.  PORTABLE PELVIS  Comparison: None.  Findings: AP views of the pelvis at 1612 hours.  Sequelae of right hip arthroplasty.  Distal aspect of the femoral hardware is not included.  Visualized components appear intact and normally aligned.  No unexpected osseous changes.  Advanced osteophytosis in the lower lumbar spine.  IMPRESSION: Right hip arthroplasty with no adverse features.  Original Report Authenticated By: Harley Hallmark, M.D.   Dg Hip Portable 1 View Right  04/24/2011  *RADIOLOGY REPORT*  Clinical Data: Right hip replacement  PORTABLE RIGHT HIP - 1 VIEW  Comparison: Same day  Findings: Lateral view shows total hip replacement on the right with a good appearance of the distal femoral stem.  IMPRESSION: Good appearance on the lateral view following total hip replacement on the right.  Original Report Authenticated By: Thomasenia Sales, M.D.    Disposition: Skilled nursing  facility  Discharge Orders    Future Appointments: Provider: Department: Dept Phone: Center:   07/25/2011 1:30 PM  Waymon Budge, MD [facility] [phone] None     Future Orders Please Complete By Expires   Increase activity slowly      [facility]       May shower / Bathe      Driving Restrictions      Comments:   No driving for 2 weeks.   Change dressing (specify)      Comments:   Dressing change as needed.   Call MD for:  temperature >100.4      Call MD for:  severe uncontrolled pain      Call MD for:  redness, tenderness, or signs of infection (pain, swelling, redness, odor or green/yellow discharge around incision site)      Discharge instructions      Comments:   F/U with Dr. Turner Daniels in 10 days.  Weightbearing as tolerated.         SignedHazle Nordmann. 04/26/2011, 12:47 PM

## 2011-04-26 NOTE — Progress Notes (Addendum)
PATIENT ID: [name]  MRN: [id number]  DOB/AGE:  76-Apr-1930 / 76 y.o.  2 Days Post-Op Procedure(s) (LRB): TOTAL HIP ARTHROPLASTY (Right)    PROGRESS NOTE Subjective: Patient is alert, oriented,noNausea, no Vomiting, yes passing gas, no Bowel Movement. Taking PO well. Denies SOB, Chest or Calf Pain. Using Incentive Spirometer, PAS in place. Ambulating slowly with PT.  Patient reports pain as moderate.  Complains of fatigue and shortness of breath. .    Objective: Vital signs in last 24 hours: Filed Vitals:   04/25/11 2254 04/26/11 0501 04/26/11 0800 04/26/11 1034  BP: 116/60 124/65  123/70  Pulse:  115    Temp:  98.8 F (37.1 C)    TempSrc:  Oral    Resp:  16    Height:      Weight:      SpO2:  93% 93%       Intake/Output from previous day: I/O last 3 completed shifts: In: 4325 [P.O.:960; I.V.:3365] Out: 3000 [Urine:3000]   Intake/Output this shift: Total I/O In: 212 [P.O.:212] Out: 75 [Urine:75]   LABORATORY DATA:  Basename 04/26/11 0610 04/25/11 0705  WBC 11.0* 9.0  HGB 8.4* 8.0*  HCT 25.0* 24.2*  PLT 286 276  NA -- 136  K -- 4.6  CL -- 102  CO2 -- 28  BUN -- 13  CREATININE -- 0.87  GLUCOSE -- 166*  GLUCAP -- --  INR 1.44 1.43  CALCIUM -- 8.6    Examination: Neurologically intact ABD soft Neurovascular intact Sensation intact distally Intact pulses distally Dorsiflexion/Plantar flexion intact Incision: scant drainage} XR AP&Lat of hip shows well placed\fixed THA  Assessment:   2 Days Post-Op Procedure(s) (LRB): TOTAL HIP ARTHROPLASTY (Right) ADDITIONAL DIAGNOSIS:  Acute Blood Loss Anemia  Plan: PT/OT WBAT, THA  posterior precautions  DVT Prophylaxis: Lovenox\Coumadin bridge, monitor INR 1.5-2.0 target  Will transfuse 1 unit PRBC today  DISCHARGE PLAN: Skilled Nursing Facility/Rehab tomorrow.  DISCHARGE NEEDS: HHPT, HHRN, Walker and 3-in-1 comode seat

## 2011-04-26 NOTE — Progress Notes (Signed)
OT NOTE:  OT consult received and appreciated. Noted that pt. Is to D/C to [facility] 04/27/11. Discussed with pt. And will defer OT evaluation to SNF per pt. Request.   Will sign off acutely. Thanks!  Cassandria Anger, OTR/L Pager: [phone] 04/26/2011 .

## 2011-04-26 NOTE — Progress Notes (Signed)
Covering Clinical Child psychotherapist (CSW) visited pt room and completed psychosocial assessment which can be found in pt shadow chart. Pt confirmed she is from home and had prearranged placement at [facility] before being admitted. This CSW has confirmed with the following CSW C.Mordecai Maes that [facility] has offered placement for pt. Pt reported that she would like to be transported by her son tomorrow. CSW informed pt that CSW can arranged transport tomorrow via a non emergency ambulance however if pt prefers, and MD/RN believe pt stable enough then pt should be able to transport by car. Pt will speak to her son and follow up with weekend CSW tomorrow at dc.  Theresia Bough, MSW, Theresia Majors [phone]

## 2011-04-27 LAB — CBC
HCT: 23.9 % — ABNORMAL LOW (ref 36.0–46.0)
Hemoglobin: 8 g/dL — ABNORMAL LOW (ref 12.0–15.0)
MCV: 85.4 fL (ref 78.0–100.0)
RDW: 13.5 % (ref 11.5–15.5)
WBC: 7.5 10*3/uL (ref 4.0–10.5)

## 2011-04-27 LAB — PROTIME-INR: INR: 1.54 — ABNORMAL HIGH (ref 0.00–1.49)

## 2011-04-27 NOTE — Progress Notes (Signed)
Subjective: 3 Days Post-Op Procedure(s) (LRB): TOTAL HIP ARTHROPLASTY (Right) Going to snf today Activity level:  walking Diet tolerance:  eating Voiding  ok Patient reports pain as 2 on 0-10 scale.    Objective: Vital signs in last 24 hours: Temp:  [98 F (36.7 C)] 98 F (36.7 C) (02/23 0601) Pulse Rate:  [102-151] 112  (02/23 0601) Resp:  [19-20] 19  (02/23 0601) BP: (94-123)/(68-72) 117/72 mmHg (02/23 0601) SpO2:  [92 %-98 %] 93 % (02/23 0601)  Intake/Output from previous day: 02/22 0701 - 02/23 0700 In: 449 [P.O.:449] Out: 75 [Urine:75] Intake/Output this shift:     Basename 04/27/11 0630 04/26/11 0610 04/25/11 0705  HGB 8.0* 8.4* 8.0*    Basename 04/27/11 0630 04/26/11 0610  WBC 7.5 11.0*  RBC 2.80* 2.90*  HCT 23.9* 25.0*  PLT 281 286    Basename 04/25/11 0705  NA 136  K 4.6  CL 102  CO2 28  BUN 13  CREATININE 0.87  GLUCOSE 166*  CALCIUM 8.6    Basename 04/27/11 0630 04/26/11 0610  LABPT -- --  INR 1.54* 1.44    PHYSICAL EXAM:  Neurologically intact ABD soft Neurovascular intact Sensation intact distally Intact pulses distally Dorsiflexion/Plantar flexion intact No cellulitis present Compartment soft  Assessment/Plan: 3 Days Post-Op Procedure(s) (LRB): TOTAL HIP ARTHROPLASTY (Right) Up with therapy D/C IV fluids Discharge to SNF  Karson Reede R 04/27/2011, 8:09 AM

## 2011-04-27 NOTE — Progress Notes (Signed)
Physical Therapy Treatment Patient Details Name: [name] MRN: [id number] DOB: 04-03-1928 Today's Date: 04/27/2011  PT Assessment/Plan  PT - Assessment/Plan Comments on Treatment Session: Pt progressing well today.  PT Plan: Discharge plan remains appropriate PT Frequency: 7X/week Follow Up Recommendations: Skilled nursing facility Equipment Recommended: Defer to next venue PT Goals  Acute Rehab PT Goals PT Goal: Supine/Side to Sit - Progress: Progressing toward goal PT Goal: Stand to Sit - Progress: Progressing toward goal PT Goal: Ambulate - Progress: Progressing toward goal Additional Goals PT Goal: Additional Goal #1 - Progress: Progressing toward goal  PT Treatment Precautions/Restrictions  Precautions Precautions: Posterior Hip Precaution Booklet Issued: Yes (comment) Precaution Comments: Pt reeducated on 3/3 posterior hip precautions.  Required Braces or Orthoses: No Restrictions Weight Bearing Restrictions: Yes RLE Weight Bearing: Weight bearing as tolerated Mobility (including Balance) Bed Mobility Supine to Sit: 4: Min assist Supine to Sit Details (indicate cue type and reason): A for R LE. Cues for positioning.  Sit to Supine: Not Tested (comment) Transfers Sit to Stand: 4: Min assist;From bed;With upper extremity assist Sit to Stand Details (indicate cue type and reason): A for balance and cues for safe hand placement Stand to Sit: 4: Min assist;With armrests;To chair/3-in-1 Stand to Sit Details: A to control descent into recliner. Cues for safe positioning.  Ambulation/Gait Ambulation/Gait Assistance: 4: Min assist Ambulation/Gait Assistance Details (indicate cue type and reason): Cues for gait sequence and RW positioning.  Ambulation Distance (Feet): 10 Feet Assistive device: Rolling walker Gait Pattern: Step-to pattern;Trunk flexed    Exercise  Total Joint Exercises Ankle Circles/Pumps: AROM;Right;10 reps Quad Sets: AROM;Right;10 reps Heel  Slides: AAROM;Right;10 reps Hip ABduction/ADduction: AAROM;Right;10 reps End of Session PT - End of Session Equipment Utilized During Treatment: Gait belt Activity Tolerance: Patient tolerated treatment well Patient left: in chair;with call bell in reach Nurse Communication: Mobility status for transfers;Mobility status for ambulation General Behavior During Session: [facility] for tasks performed Cognition: [facility] for tasks performed  Anvi Mangal, Adline Potter 04/27/2011, 1:20 PM 04/27/2011 Fredrich Birks PTA [phone] pager [phone] office

## 2011-04-27 NOTE — Progress Notes (Signed)
Pt to transfer to [facility] today via [facility]. Pt, pt's family, and SNF aware of d/c. No other CSW needs. CSW signing off.  Dellie Burns, MSW, [place] [phone]

## 2011-04-29 ENCOUNTER — Encounter (HOSPITAL_COMMUNITY): Payer: Self-pay | Admitting: Orthopedic Surgery

## 2011-04-29 LAB — TYPE AND SCREEN: Unit division: 0

## 2011-06-28 ENCOUNTER — Ambulatory Visit (INDEPENDENT_AMBULATORY_CARE_PROVIDER_SITE_OTHER): Payer: Medicare Other | Admitting: Internal Medicine

## 2011-06-28 ENCOUNTER — Encounter: Payer: Self-pay | Admitting: Internal Medicine

## 2011-06-28 VITALS — BP 106/70 | HR 73 | Ht 60.0 in | Wt 107.8 lb

## 2011-06-28 DIAGNOSIS — M8700 Idiopathic aseptic necrosis of unspecified bone: Secondary | ICD-10-CM

## 2011-06-28 DIAGNOSIS — A310 Pulmonary mycobacterial infection: Secondary | ICD-10-CM

## 2011-06-28 DIAGNOSIS — R911 Solitary pulmonary nodule: Secondary | ICD-10-CM

## 2011-06-28 NOTE — Patient Instructions (Signed)
Order- non contrast CT chest   Dx lung nodule, compare with prior.

## 2011-06-28 NOTE — Progress Notes (Signed)
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 Wende [place]Zanone in [facility] because of COPD. She had smoked 2 packs per day for 26 years/52 pack years, ending in 1984. Mrs. Anna GenreConroy assumes the diagnosis is COPD. She had seen Dr.DeCoy/Pulmonary in the past before he left town. He treated her with Spiriva which has helped. Symbicort 160 has not helped. She is aware of progressive dyspnea on exertion over the last several years. Has had episodes of bronchitis. Daily cough brings yellow and sometimes green or brown sputum. She denies blood. She was hospitalized once with pneumonia but not for other lung problems. She has not had home oxygen. Has nebulizer machine at home but is not using it. Went to an urgent care this summer for bronchitis, treated with prednisone and an antibiotic. Cardiac problems limited to occasional rapid heartbeat. Anemia treated with iron. Has had 3 spinal steroid injections and is followed by a spine specialist. 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 Intolerant of aspirin and Avelox. Asks flu shot. Has had pneumococcal vaccine but not sure of the date.  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 She had smoked 2 packs per day for 26 years/52 pack years, ending in 1984. Has had flu and pneumonia vaccines. Since last here she had fallen and hit her head with bruising. Has aseptic necrosis of the right hip and is seeing Dr.Took/ Ortho, but does not want surgery. Occasional cough and persistent throat clearing, some wheeze and a few night sweats. She remembers getting a nebulizer treatment once which gave ability to clear mucus from her airways. She called a television advertisement and got a nebulizer machine and medication from an out of state company. She has used  it only once and got little sputum up with that trial. CT 12/20/10- I reviewed images with her- IMPRESSION:  1. Pulmonary parenchymal pattern of diffuse mild bronchiectasis  and peribronchovascular nodularity is indicative of mycobacterium  avium complex (MAC).  2. Vague area of irregular nodularity in the right lower lobe  (image 32). While this is likely within the spectrum of presumed  MAC, a pulmonary nodule cannot be definitively excluded. If the  patient is at high risk for bronchogenic carcinoma, follow-up chest  CT at 3-6 months is recommended. If the patient is at low risk for  bronchogenic carcinoma, follow-up chest CT at 6-12 months is  recommended. This recommendation follows the consensus statement:  Guidelines for Management of Small Pulmonary Nodules Detected on CT  Scans: A Statement from the Fleischner Society as published in  Radiology 2005; 237:395-400. Online at:  [email or web address].[employer].edu/rad/res/Fleischner-nodule.htm.  3. Dominant right thyroid nodule with associated calcifications.  Thyroid ultrasound is recommended in further evaluation, as  clinically indicated.  4. Coronary artery calcification.  Original  Report Authenticated By: Reyes IvanMELINDA A. BLIETZ, M.D.   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 MAIC, ? Nodule, thyroid nodule, CAD. C/O sob with exertion, occasional wheezing, and feels like there is a band around her chest Had right hip replacement which relieved her pain. Chest feels okay now. Not coughing. Does bring up a little clear mucus occasionally, no blood. Denies fever or sweat. She is aware she has a chronic right thyroid nodule. CT scan of chest from 12/20/2010 was reviewed last visit. Sputum from 02/01/2011 was  positive for [facility]. We have discussed indications for treatment and the option of continued observation instead of antibacterial treatment for now.   ROS-See HPI Constitutional:   No-   weight  loss, night sweats, fevers, chills, fatigue, lassitude. HEENT:   No-  headaches, difficulty swallowing, tooth/dental problems, sore throat,       No-  sneezing, itching, ear ache, nasal congestion, post nasal drip,  CV:  No-   chest pain, orthopnea, PND, swelling in lower extremities, anasarca, dizziness, palpitations Resp: +  shortness of breath with exertion, Not or at rest.              +  productive cough,  No non-productive cough,  No- coughing up of blood.              No-   change in color of mucus.  No- wheezing.   Skin: No-   rash or lesions. GI:  No-   heartburn, indigestion, abdominal pain, nausea, vomiting, GU: . MS:  No-   joint pain or swelling.   Neuro-     nothing unusual Psych:  No- change in mood or affect. No depression or anxiety.  No memory loss.  OBJ General- Alert, Oriented, Affect-appropriate, Distress- none acute; look frail Skin- rash-none,excoriation- none. Large bruise left mid face Lymphadenopathy- none Head- atraumatic            Eyes- Gross vision intact, PERRLA, conjunctivae clear secretions            Ears- Hearing, canals-normal            Nose- Clear, no-Septal dev, mucus, polyps, erosion, perforation             Throat- Mallampati II , mucosa clear , drainage- none seen, but she is throat clearing, tonsils- atrophic Neck- flexible , trachea midline, no stridor , thyroid- nodule R, carotid no bruit Chest - symmetrical excursion , unlabored           Heart/CV- RRR , no murmur , no gallop  , no rub, nl s1 s2                           - JVD- none , edema- none, stasis changes- none, varices- none           Lung- clear to P&A- distant,   wheeze- none, cough- none , dullness-none, rub- none           Chest wall-  Abd-  Br/ Gen/ Rectal- Not done, not indicated Extrem- cyanosis- none, clubbing, none, atrophy- none, strength- nl Neuro- grossly intact to observation

## 2011-07-03 DIAGNOSIS — R911 Solitary pulmonary nodule: Secondary | ICD-10-CM | POA: Insufficient documentation

## 2011-07-03 DIAGNOSIS — A310 Pulmonary mycobacterial infection: Secondary | ICD-10-CM | POA: Insufficient documentation

## 2011-07-03 NOTE — Assessment & Plan Note (Signed)
I discussed to the usual pattern of prolonged multidrug antimicrobial therapy usually required for managing this disease and the possibility that is better left untreated unless she is clearly progressive. We talked through these issues. Plan-followup chest CT looking for evidence of progression of bronchiectasis

## 2011-07-05 ENCOUNTER — Ambulatory Visit (INDEPENDENT_AMBULATORY_CARE_PROVIDER_SITE_OTHER)
Admission: RE | Admit: 2011-07-05 | Discharge: 2011-07-05 | Disposition: A | Discharge status: Home / Self Care | Payer: Medicare Other | Source: Ambulatory Visit | Attending: Internal Medicine | Admitting: Internal Medicine

## 2011-07-05 DIAGNOSIS — R911 Solitary pulmonary nodule: Secondary | ICD-10-CM

## 2011-07-12 NOTE — Progress Notes (Signed)
Quick Note:  Pt aware of results and will see CY for ROV on 07-31-11 at 1130am. ______

## 2011-07-25 ENCOUNTER — Ambulatory Visit: Payer: Medicare Other | Admitting: Internal Medicine

## 2011-07-31 ENCOUNTER — Encounter: Payer: Self-pay | Admitting: Internal Medicine

## 2011-07-31 ENCOUNTER — Ambulatory Visit (INDEPENDENT_AMBULATORY_CARE_PROVIDER_SITE_OTHER): Payer: Medicare Other | Admitting: Internal Medicine

## 2011-07-31 VITALS — BP 140/80 | HR 76 | Ht 60.0 in | Wt 110.6 lb

## 2011-07-31 DIAGNOSIS — J449 Chronic obstructive pulmonary disease, unspecified: Secondary | ICD-10-CM

## 2011-07-31 NOTE — Progress Notes (Deleted)
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 [facility]Zanone in [facility] because of COPD. She had smoked 2 packs per day for 26 years/52 pack years, ending in 1984. Mrs. Anna GenreConroy assumes the diagnosis is COPD. She had seen Dr.DeCoy/Pulmonary in the past before he left town. He treated her with Spiriva which has helped. Symbicort 160 has not helped. She is aware of progressive dyspnea on exertion over the last several years. Has had episodes of bronchitis. Daily cough brings yellow and sometimes green or brown sputum. She denies blood. She was hospitalized once with pneumonia but not for other lung problems. She has not had home oxygen. Has nebulizer machine at home but is not using it. Went to an urgent care this summer for bronchitis, treated with prednisone and an antibiotic. Cardiac problems limited to occasional rapid heartbeat. Anemia treated with iron. Has had 3 spinal steroid injections and is followed by a spine specialist. 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 Intolerant of aspirin and Avelox. Asks flu shot. Has had pneumococcal vaccine but not sure of the date.  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 She had smoked 2 packs per day for 26 years/52 pack years, ending in 1984. Has had flu and pneumonia vaccines. Since last here she had fallen and hit her head with bruising. Has aseptic necrosis of the right hip and is seeing Dr.Took/ Ortho, but does not want surgery. Occasional cough and persistent throat clearing, some wheeze and a few night sweats. She remembers getting a nebulizer treatment once which gave ability to clear mucus from her airways. She called a television advertisement and got a nebulizer machine and medication from an out of state company. She has used  it only once and got little sputum up with that trial. CT 12/20/10- I reviewed images with her- IMPRESSION:  1. Pulmonary parenchymal pattern of diffuse mild bronchiectasis  and peribronchovascular nodularity is indicative of mycobacterium  avium complex (MAC).  2. Vague area of irregular nodularity in the right lower lobe  (image 32). While this is likely within the spectrum of presumed  MAC, a pulmonary nodule cannot be definitively excluded. If the  patient is at high risk for bronchogenic carcinoma, follow-up chest  CT at 3-6 months is recommended. If the patient is at low risk for  bronchogenic carcinoma, follow-up chest CT at 6-12 months is  recommended. This recommendation follows the consensus statement:  Guidelines for Management of Small Pulmonary Nodules Detected on CT  Scans: A Statement from the Fleischner Society as published in  Radiology 2005; 237:395-400. Online at:  [email or web address].[employer].edu/rad/res/Fleischner-nodule.htm.  3. Dominant right thyroid nodule with associated calcifications.  Thyroid ultrasound is recommended in further evaluation, as  clinically indicated.  4. Coronary artery calcification.  Original  Report Authenticated By: Reyes IvanMELINDA A. BLIETZ, M.D.   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 MAIC, ? Nodule, thyroid nodule, CAD. C/O sob with exertion, occasional wheezing, and feels like there is a band around her chest Had right hip replacement which relieved her pain. Chest feels okay now. Not coughing. Does bring up a little clear mucus occasionally, no blood. Denies fever or sweat. She is aware she has a chronic right thyroid nodule. CT scan of chest from 12/20/2010 was reviewed last visit. Sputum from 02/01/2011 was  positive for [facility]. We have discussed indications for treatment and the option of continued observation instead of antibacterial treatment for now.  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 MAIC, ? Nodule, thyroid nodule, CAD. Cough-productive-yellow/gray in color-noticed its happening more often than about 6 months ago CT chest 07/12/11-  IMPRESSION:  1. Pulmonary parenchymal pattern of diffuse peribronchovascular  nodularity, bronchiectasis and scattered mucoid impaction is  indicative of mycobacterium avium complex (MAC).  2. Right lower lobe nodular density is unchanged and likely within  the spectrum of presumed MAC. If further follow-up is desired, CT  chest without contrast in 12 months is recommended.  3. Dominant right thyroid nodule, as before. Recommend further  evaluation with thyroid ultrasound. If patient is clinically  hyperthyroid, consider nuclear medicine thyroid uptake and scan.  Original Report Authenticated By: Reyes Ivan, M.D.        ROS-See HPI Constitutional:   No-   weight loss, night sweats, fevers, chills, fatigue, lassitude. HEENT:   No-  headaches, difficulty swallowing, tooth/dental problems, sore throat,       No-  sneezing, itching, ear ache, nasal congestion, post nasal drip,  CV:  No-   chest pain, orthopnea, PND, swelling in lower extremities, anasarca, dizziness, palpitations Resp: +  shortness of breath with exertion, Not or at rest.              +  productive cough,  No non-productive cough,  No- coughing up of blood.              No-   change in color of mucus.  No- wheezing.   Skin: No-   rash or lesions. GI:  No-   heartburn, indigestion, abdominal pain, nausea, vomiting, GU: . MS:  No-   joint pain or swelling.   Neuro-     nothing unusual Psych:  No- change in mood or affect. No depression or anxiety.  No memory loss.  OBJ General- Alert, Oriented, Affect-appropriate, Distress- none acute; look frail Skin- rash-none,excoriation- none. Large bruise left mid face Lymphadenopathy- none Head- atraumatic            Eyes- Gross vision intact, PERRLA, conjunctivae clear  secretions            Ears- Hearing, canals-normal            Nose- Clear, no-Septal dev, mucus, polyps, erosion, perforation             Throat- Mallampati II , mucosa clear , drainage- none seen, but she is throat clearing, tonsils- atrophic Neck- flexible , trachea midline, no stridor , thyroid- nodule R, carotid no bruit Chest - symmetrical excursion , unlabored           Heart/CV- RRR , no murmur , no gallop  , no rub, nl s1 s2                           - JVD- none , edema- none, stasis changes- none, varices- none           Lung- clear to P&A- distant,   wheeze- none, cough- none , dullness-none, rub- none           Chest wall-  Abd-  Br/ Gen/ Rectal- Not done, not indicated Extrem- cyanosis- none, clubbing, none, atrophy- none, strength- nl Neuro- grossly intact to observation

## 2011-08-08 ENCOUNTER — Ambulatory Visit (INDEPENDENT_AMBULATORY_CARE_PROVIDER_SITE_OTHER): Payer: Medicare Other | Admitting: Internal Medicine

## 2011-08-08 ENCOUNTER — Encounter: Payer: Self-pay | Admitting: Internal Medicine

## 2011-08-08 VITALS — BP 120/78 | HR 76 | Ht 60.0 in | Wt 111.2 lb

## 2011-08-08 DIAGNOSIS — J449 Chronic obstructive pulmonary disease, unspecified: Secondary | ICD-10-CM

## 2011-08-08 DIAGNOSIS — A310 Pulmonary mycobacterial infection: Secondary | ICD-10-CM

## 2011-08-08 DIAGNOSIS — R911 Solitary pulmonary nodule: Secondary | ICD-10-CM

## 2011-08-08 MED ORDER — ETHAMBUTOL HCL 400 MG PO TABS: ORAL_TABLET | ORAL | Status: DC

## 2011-08-08 MED ORDER — CLARITHROMYCIN 250 MG PO TABS: ORAL_TABLET | ORAL | Status: DC

## 2011-08-08 MED ORDER — RIFAMPIN 300 MG PO CAPS: ORAL_CAPSULE | ORAL | Status: DC

## 2011-08-08 NOTE — Assessment & Plan Note (Signed)
Atypical [employer]/MAIC. I have discussed the usual treatment strategy, using long-term triple therapy, once a decision is made to treat. She is finding her cough bothersome and has agreed to proceed. We have discussed medication intolerance and side effects will monitor liver function. Plan-start triple therapy with Biaxin, Rifadin, ethambutol taken 3 days per week. We will begin with local prescription and if she tolerates the medications we can switch to mail order prescription.

## 2011-08-08 NOTE — Assessment & Plan Note (Signed)
CAT COPD assessment form today 8/40

## 2011-08-08 NOTE — Progress Notes (Signed)
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 Wende BushyZanone in [facility] because of COPD. She had smoked 2 packs per day for 26 years/52 pack years, ending in 1984. Mrs. Anna GenreConroy assumes the diagnosis is COPD. She had seen Dr.DeCoy/Pulmonary in the past before he left town. He treated her with Spiriva which has helped. Symbicort 160 has not helped. She is aware of progressive dyspnea on exertion over the last several years. Has had episodes of bronchitis. Daily cough brings yellow and sometimes green or brown sputum. She denies blood. She was hospitalized once with pneumonia but not for other lung problems. She has not had home oxygen. Has nebulizer machine at home but is not using it. Went to an urgent care this summer for bronchitis, treated with prednisone and an antibiotic. Cardiac problems limited to occasional rapid heartbeat. Anemia treated with iron. Has had 3 spinal steroid injections and is followed by a spine specialist. 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 Intolerant of aspirin and Avelox. Asks flu shot. Has had pneumococcal vaccine but not sure of the date.  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 She had smoked 2 packs per day for 26 years/52 pack years, ending in 1984. Has had flu and pneumonia vaccines. Since last here she had fallen and hit her head with bruising. Has aseptic necrosis of the right hip and is seeing Dr.Took/ Ortho, but does not want surgery. Occasional cough and persistent throat clearing, some wheeze and a few night sweats. She remembers getting a nebulizer treatment once which gave ability to clear mucus from her airways. She called a television advertisement and got a nebulizer machine and medication from an out of state company. She has used  it only once and got little sputum up with that trial. CT 12/20/10- I reviewed images with her- IMPRESSION:  1. Pulmonary parenchymal pattern of diffuse mild bronchiectasis  and peribronchovascular nodularity is indicative of mycobacterium  avium complex (MAC).  2. Vague area of irregular nodularity in the right lower lobe  (image 32). While this is likely within the spectrum of presumed  MAC, a pulmonary nodule cannot be definitively excluded. If the  patient is at high risk for bronchogenic carcinoma, follow-up chest  CT at 3-6 months is recommended. If the patient is at low risk for  bronchogenic carcinoma, follow-up chest CT at 6-12 months is  recommended. This recommendation follows the consensus statement:  Guidelines for Management of Small Pulmonary Nodules Detected on CT  Scans: A Statement from the Fleischner Society as published in  Radiology 2005; 237:395-400. Online at:  DietDisorder.czhttp://[email or web address].[employer].edu/rad/res/Fleischner-nodule.htm.  3. Dominant right thyroid nodule with associated calcifications.  Thyroid ultrasound is recommended in further evaluation, as  clinically indicated.  4. Coronary artery calcification.  Original  Report Authenticated By: Reyes IvanMELINDA A. BLIETZ, M.D.   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 MAIC, ? Nodule, thyroid nodule, CAD. C/O sob with exertion, occasional wheezing, and feels like there is a band around her chest Had right hip replacement which relieved her pain. Chest feels okay now. Not coughing. Does bring up a little clear mucus occasionally, no blood. Denies fever or sweat. She is aware she has a chronic right thyroid nodule. CT scan of chest from 12/20/2010 was reviewed last visit. Sputum from 02/01/2011 was  positive for [facility]. We have discussed indications for treatment and the option of continued observation instead of antibacterial treatment for now.  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 Nodule, thyroid nodule, CAD. Cough-productive-yellow/gray in color-noticed its happening more often than about 6 months ago. SOB with long walks.  Since last here, she had right total hip replacement in February and is doing very well. Variable cough produces thick sputum, mostly white. She denies night sweats or fever. Sneezing this spring controlled with OTC antihistamine. CT chest 07/12/11-  IMPRESSION:  1. Pulmonary parenchymal pattern of diffuse peribronchovascular  nodularity, bronchiectasis and scattered mucoid impaction is  indicative of mycobacterium avium complex (MAC).  2. Right lower lobe nodular density is unchanged and likely within  the spectrum of presumed MAC. If further follow-up is desired, CT  chest without contrast in 12 months is recommended.  3. Dominant right thyroid nodule, as before. Recommend further  evaluation with thyroid ultrasound. If patient is clinically  hyperthyroid, consider nuclear medicine thyroid uptake and scan.  Original Report Authenticated By: Reyes Ivan, M.D.   ROS-See HPI Constitutional:   No-   weight loss, night sweats, fevers, chills, fatigue, lassitude. HEENT:   No-  headaches, difficulty swallowing, tooth/dental problems, sore throat,       No-  sneezing, itching, ear ache, nasal congestion, post nasal drip,  CV:  No-   chest pain, orthopnea, PND, swelling in lower extremities, anasarca, dizziness, palpitations Resp: +  shortness of breath with exertion, Not or at rest.              +  productive cough,  No non-productive cough,  No- coughing up of blood.              No-   change in color of mucus.  No- wheezing.   Skin: No-   rash or lesions. GI:  No-   heartburn, indigestion, abdominal pain, nausea, vomiting, GU: . MS:  No-   joint pain or swelling.   Neuro-     nothing unusual Psych:  No- change in mood or affect. No depression or anxiety.  No memory  loss.  OBJ General- Alert, Oriented, Affect-appropriate, Distress- none acute; petite Skin- rash-none,excoriation- none. Large bruise left mid face Lymphadenopathy- none Head- atraumatic            Eyes- Gross vision intact, PERRLA, conjunctivae clear secretions            Ears- Hearing, canals-normal            Nose- Clear, no-Septal dev, mucus, polyps, erosion, perforation             Throat- Mallampati II , mucosa clear , drainage- none seen, but she is throat clearing, tonsils- atrophic Neck- flexible , trachea midline, no stridor , thyroid+ nodule R, carotid no bruit Chest - symmetrical excursion , unlabored           Heart/CV- RRR , no murmur , no gallop  , no rub, nl s1 s2                           - JVD- none , edema- none, stasis changes- none, varices- none           Lung- distant faint crackle L base,   wheeze- none, cough- none , dullness-none, rub- none           Chest wall-  Abd-  Br/ Gen/ Rectal- Not done, not indicated Extrem- cyanosis- none, clubbing, none, atrophy- none, strength- nl Neuro- grossly intact to observation

## 2011-08-08 NOTE — Assessment & Plan Note (Signed)
Plan f/u imaging at long intervals.

## 2011-08-08 NOTE — Patient Instructions (Signed)
We will call in three prescriptions for the antibiotics you will be taking 3 days each week for the atypical AFB infection "MAIC"  If you are doing ok with the meds after 2 weeks, please let us know and we will make up 3 month [facility] scripts to continue these meds.

## 2011-08-14 ENCOUNTER — Telehealth: Payer: Self-pay | Admitting: Internal Medicine

## 2011-08-14 MED ORDER — ETHAMBUTOL HCL 400 MG PO TABS: ORAL_TABLET | ORAL | Status: DC

## 2011-08-14 MED ORDER — CLARITHROMYCIN 250 MG PO TABS: ORAL_TABLET | ORAL | Status: DC

## 2011-08-14 MED ORDER — RIFAMPIN 300 MG PO CAPS: ORAL_CAPSULE | ORAL | Status: DC

## 2011-08-14 NOTE — Telephone Encounter (Signed)
Spoke with the pt and she wanted to know does she have to take all three of her medications, which is 7 tabs total, at one time or can she space it out some. She states she has taken all the meds together and she became lightheaded each time. She states today she spaced it out a little and felt much better. I advised the pt that this was fine to do as long as she takes the meds on her designated days. Pt states understanding. Pt states since she is doing well she would like a new rx sent to [facility] for 90 day supply. Per last OV note Dr. Maple Hudson stated the following: If you are doing ok with the meds after 2 weeks, please let us know and we will make up 3 month [facility] scripts to continue these meds.    Refills sent.Carron Curie, CMA

## 2011-08-23 ENCOUNTER — Telehealth: Payer: Self-pay | Admitting: Internal Medicine

## 2011-08-23 MED ORDER — ALBUTEROL SULFATE HFA 108 (90 BASE) MCG/ACT IN AERS: 2.0000 | INHALATION_SPRAY | RESPIRATORY_TRACT | Status: DC | PRN

## 2011-08-23 NOTE — Telephone Encounter (Signed)
Called, spoke with pt.  She would would an albuterol rescue inhaler sent to [facility] for 3months.  Rx sent -- pt aware.

## 2011-10-08 ENCOUNTER — Ambulatory Visit (INDEPENDENT_AMBULATORY_CARE_PROVIDER_SITE_OTHER)
Admission: RE | Admit: 2011-10-08 | Discharge: 2011-10-08 | Disposition: A | Discharge status: Home / Self Care | Payer: Medicare Other | Source: Ambulatory Visit | Attending: Internal Medicine | Admitting: Internal Medicine

## 2011-10-08 ENCOUNTER — Ambulatory Visit (INDEPENDENT_AMBULATORY_CARE_PROVIDER_SITE_OTHER): Payer: Medicare Other | Admitting: Internal Medicine

## 2011-10-08 ENCOUNTER — Encounter: Payer: Self-pay | Admitting: Internal Medicine

## 2011-10-08 VITALS — BP 102/60 | HR 74 | Ht 60.0 in | Wt 111.4 lb

## 2011-10-08 DIAGNOSIS — A319 Mycobacterial infection, unspecified: Secondary | ICD-10-CM

## 2011-10-08 DIAGNOSIS — A310 Pulmonary mycobacterial infection: Secondary | ICD-10-CM

## 2011-10-08 DIAGNOSIS — J42 Unspecified chronic bronchitis: Secondary | ICD-10-CM

## 2011-10-08 DIAGNOSIS — J449 Chronic obstructive pulmonary disease, unspecified: Secondary | ICD-10-CM

## 2011-10-08 NOTE — Patient Instructions (Addendum)
Order CXR   Dx Chronic bronchitis, atypical mycobacterial infection  Consider otc cough meds- Delsym, Robitussin-DM, or throat lozenges

## 2011-10-08 NOTE — Progress Notes (Signed)
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 Wende [facility]Zanone in [facility] because of COPD. She had smoked 2 packs per day for 26 years/52 pack years, ending in 1984. [name] assumes the diagnosis is COPD. She had seen Dr.DeCoy/Pulmonary in the past before he left town. He treated her with Spiriva which has helped. Symbicort 160 has not helped. She is aware of progressive dyspnea on exertion over the last several years. Has had episodes of bronchitis. Daily cough brings yellow and sometimes green or brown sputum. She denies blood. She was hospitalized once with pneumonia but not for other lung problems. She has not had home oxygen. Has nebulizer machine at home but is not using it. Went to an urgent care this summer for bronchitis, treated with prednisone and an antibiotic. Cardiac problems limited to occasional rapid heartbeat. Anemia treated with iron. Has had 3 spinal steroid injections and is followed by a spine specialist. 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 Intolerant of aspirin and Avelox. Asks flu shot. Has had pneumococcal vaccine but not sure of the date.  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 She had smoked 2 packs per day for 26 years/52 pack years, ending in 1984. Has had flu and pneumonia vaccines. Since last here she had fallen and hit her head with bruising. Has aseptic necrosis of the right hip and is seeing Dr.Took/ Ortho, but does not want surgery. Occasional cough and persistent throat clearing, some wheeze and a few night sweats. She remembers getting a nebulizer treatment once which gave ability to clear mucus from her airways. She called a television advertisement and got a nebulizer machine and medication from an out of state company. She has used  it only once and got little sputum up with that trial. CT 12/20/10- I reviewed images with her- IMPRESSION:  1. Pulmonary parenchymal pattern of diffuse mild bronchiectasis  and peribronchovascular nodularity is indicative of mycobacterium  avium complex (MAC).  2. Vague area of irregular nodularity in the right lower lobe  (image 32). While this is likely within the spectrum of presumed  MAC, a pulmonary nodule cannot be definitively excluded. If the  patient is at high risk for bronchogenic carcinoma, follow-up chest  CT at 3-6 months is recommended. If the patient is at low risk for  bronchogenic carcinoma, follow-up chest CT at 6-12 months is  recommended. This recommendation follows the consensus statement:  Guidelines for Management of Small Pulmonary Nodules Detected on CT  Scans: A Statement from the Fleischner Society as published in  Radiology 2005; 237:395-400. Online at:  DietDisorder.czhttp://www.med.[employer].edu/rad/res/Fleischner-nodule.htm.  3. Dominant right thyroid nodule with associated calcifications.  Thyroid ultrasound is recommended in further evaluation, as  clinically indicated.  4. Coronary artery calcification.  Original  Report Authenticated By: Reyes IvanMELINDA A. BLIETZ, M.D.   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 MAIC, ? Nodule, thyroid nodule, CAD. C/O sob with exertion, occasional wheezing, and feels like there is a band around her chest Had right hip replacement which relieved her pain. Chest feels okay now. Not coughing. Does bring up a little clear mucus occasionally, no blood. Denies fever or sweat. She is aware she has a chronic right thyroid nodule. CT scan of chest from 12/20/2010 was reviewed last visit. Sputum from 02/01/2011 was  positive for [facility]. We have discussed indications for treatment and the option of continued observation instead of antibacterial treatment for now.  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 Nodule, thyroid nodule, CAD. Cough-productive-yellow/gray in color-noticed its happening more often than about 6 months ago. SOB with long walks.  Since last here, she had right total hip replacement in February and is doing very well. Variable cough produces thick sputum, mostly white. She denies night sweats or fever. Sneezing this spring controlled with OTC antihistamine. CT chest 07/12/11-  IMPRESSION:  1. Pulmonary parenchymal pattern of diffuse peribronchovascular  nodularity, bronchiectasis and scattered mucoid impaction is  indicative of mycobacterium avium complex (MAC).  2. Right lower lobe nodular density is unchanged and likely within  the spectrum of presumed MAC. If further follow-up is desired, CT  chest without contrast in 12 months is recommended.  3. Dominant right thyroid nodule, as before. Recommend further  evaluation with thyroid ultrasound. If patient is clinically  hyperthyroid, consider nuclear medicine thyroid uptake and scan.  Original Report Authenticated By: Reyes Ivan, M.D.   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 Nodule, thyroid nodule, CAD. Feels as though she is wheezing at times in her thoat area. Hoarseness . Culture Pos [facility] Nov, 2012. We started triple therapy with clarithromycin 250 mg x2, right happened 300 mg x2 and Myambutol 400 mg x3, each taken 3 days per week beginning 08/08/2011 so she has not had 2 months of therapy. She complains of throat tickle and some sneeze. She has been coughing for more than 6 months. Uses tramadol once daily with 2 Tylenol. She declines cough syrup. Chronic back pain/physical therapy.  ROS-See HPI Constitutional:   No-   weight loss, night sweats, fevers, chills, fatigue, lassitude. HEENT:   No-  headaches, difficulty swallowing, tooth/dental problems, sore throat,       No-  sneezing, itching, ear  ache, nasal congestion, post nasal drip,  CV:  No-   chest pain, orthopnea, PND, swelling in lower extremities, anasarca, dizziness, palpitations Resp: +  shortness of breath with exertion, Not or at rest.              +  productive cough,  No non-productive cough,  No- coughing up of blood.              No-   change in color of mucus.  No- wheezing.   Skin: No-   rash or lesions. GI:  No-   heartburn, indigestion, abdominal pain, nausea, vomiting, GU: . MS:  No-   joint pain or swelling.   Neuro-     nothing unusual Psych:  No- change in mood or affect. No depression or anxiety.  No memory loss.  OBJ General- Alert, Oriented, Affect-appropriate, Distress- none acute; petite. Exam is not changed: Skin- rash-none,excoriation- none. Large bruise left mid face Lymphadenopathy- none Head- atraumatic            Eyes- Gross vision intact, PERRLA, conjunctivae clear secretions            Ears- Hearing, canals-normal            Nose- Clear, no-Septal dev, mucus, polyps, erosion, perforation             Throat- Mallampati II , mucosa clear , drainage- none seen, but she is throat clearing, tonsils- atrophic Neck- flexible , trachea midline, no stridor , thyroid+ nodule R,  carotid no bruit Chest - symmetrical excursion , unlabored           Heart/CV- RRR , no murmur , no gallop  , no rub, nl s1 s2                           - JVD- none , edema- none, stasis changes- none, varices- none           Lung- distant faint crackle L base,   wheeze- none, cough- none , dullness-none, rub- none           Chest wall-  Abd-  Br/ Gen/ Rectal- Not done, not indicated Extrem- cyanosis- none, clubbing, none, atrophy- none, strength- nl Neuro- grossly intact to observation

## 2011-10-10 NOTE — Progress Notes (Signed)
Quick Note:  Pt aware of results. ______ 

## 2011-10-14 NOTE — Assessment & Plan Note (Addendum)
No significant change yet in her primary symptom of cough. She is tolerating the medications so far. Plan-continue triple therapy; CXR

## 2011-10-14 NOTE — Assessment & Plan Note (Signed)
Plan-refills for her rescue inhaler

## 2011-10-15 MED ORDER — ALBUTEROL SULFATE HFA 108 (90 BASE) MCG/ACT IN AERS: 2.0000 | INHALATION_SPRAY | Freq: Four times a day (QID) | RESPIRATORY_TRACT | Status: DC | PRN

## 2011-10-15 NOTE — Addendum Note (Signed)
Addended by: Ronny Bacon on: 10/15/2011 08:39 AM   Modules accepted: Orders

## 2011-11-14 DIAGNOSIS — E039 Hypothyroidism, unspecified: Secondary | ICD-10-CM | POA: Insufficient documentation

## 2011-12-24 ENCOUNTER — Telehealth: Payer: Self-pay | Admitting: Internal Medicine

## 2011-12-24 MED ORDER — TIOTROPIUM BROMIDE MONOHYDRATE 18 MCG IN CAPS: 18.0000 ug | ORAL_CAPSULE | Freq: Every day | RESPIRATORY_TRACT | Status: DC

## 2011-12-24 MED ORDER — PREDNISONE 10 MG PO TABS: 10.0000 mg | ORAL_TABLET | Freq: Every day | ORAL | Status: DC

## 2011-12-24 NOTE — Telephone Encounter (Signed)
Suggest prednisone 10 mg. # 7, 1 daily

## 2011-12-24 NOTE — Telephone Encounter (Signed)
I spoke with pt and is aware of CDY recs. She voiced her understanding and rx has been sent to the pharmacy

## 2011-12-24 NOTE — Telephone Encounter (Signed)
Called spoke with patient who c/o runny nose w/ clear drainage, PND, some occasional cough with occ yellow&clear mucus, increased fatigue, upper wheezing x1 week.  Denies SOB, tightness, f/c/s.  Has been taking zyrtec.  Will be leaving for [facility] tomorrow morning for 1 week.  Dr Maple Hudson please advise, thanks. Last ov 8.6.13 w/ CY, follow up in 3 months >> 11.7.13 w/ CY. [facility] Allergies  Allergen Reactions  . Avelox (Moxifloxacin Hcl In Nacl) Other (See Comments)    Flushes face  . Nsaids     Does not take due to kidney disease   *pt also stated that she needs a refill on her spiriva sent to [facility].  This has been done.

## 2012-01-09 ENCOUNTER — Encounter: Payer: Self-pay | Admitting: Internal Medicine

## 2012-01-09 ENCOUNTER — Ambulatory Visit (INDEPENDENT_AMBULATORY_CARE_PROVIDER_SITE_OTHER): Payer: Medicare Other | Admitting: Internal Medicine

## 2012-01-09 VITALS — BP 120/78 | HR 61 | Ht 60.0 in | Wt 114.6 lb

## 2012-01-09 DIAGNOSIS — Z23 Encounter for immunization: Secondary | ICD-10-CM

## 2012-01-09 DIAGNOSIS — A310 Pulmonary mycobacterial infection: Secondary | ICD-10-CM

## 2012-01-09 DIAGNOSIS — K219 Gastro-esophageal reflux disease without esophagitis: Secondary | ICD-10-CM

## 2012-01-09 DIAGNOSIS — J310 Chronic rhinitis: Secondary | ICD-10-CM

## 2012-01-09 MED ORDER — OMEPRAZOLE 20 MG PO CPDR: 20.0000 mg | DELAYED_RELEASE_CAPSULE | Freq: Every day | ORAL | Status: DC

## 2012-01-09 MED ORDER — IPRATROPIUM BROMIDE 0.06 % NA SOLN: 2.0000 | Freq: Four times a day (QID) | NASAL | Status: DC

## 2012-01-09 NOTE — Patient Instructions (Addendum)
We will continue the triple antibiotic therapy 2 more months for your MAIC/ Mycobacterium Avium Intracellulare infection. At the end of that we will stop and watch to see how you are doing.   Script to try ipratropium nasal spray up to 4 times daily as needed to turn off the watery nose  Script to try omeprazole instead of pepcid for your reflux.   Flu vax

## 2012-01-09 NOTE — Progress Notes (Signed)
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 Wende [facility]hyZanone in [facility] because of COPD. She had smoked 2 packs per day for 26 years/52 pack years, ending in 1984. [name] assumes the diagnosis is COPD. She had seen Dr.DeCoy/Pulmonary in the past before he left town. He treated her with Spiriva which has helped. Symbicort 160 has not helped. She is aware of progressive dyspnea on exertion over the last several years. Has had episodes of bronchitis. Daily cough brings yellow and sometimes green or brown sputum. She denies blood. She was hospitalized once with pneumonia but not for other lung problems. She has not had home oxygen. Has nebulizer machine at home but is not using it. Went to an urgent care this summer for bronchitis, treated with prednisone and an antibiotic. Cardiac problems limited to occasional rapid heartbeat. Anemia treated with iron. Has had 3 spinal steroid injections and is followed by a spine specialist. 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 Intolerant of aspirin and Avelox. Asks flu shot. Has had pneumococcal vaccine but not sure of the date.  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 She had smoked 2 packs per day for 26 years/52 pack years, ending in 1984. Has had flu and pneumonia vaccines. Since last here she had fallen and hit her head with bruising. Has aseptic necrosis of the right hip and is seeing Dr.Took/ Ortho, but does not want surgery. Occasional cough and persistent throat clearing, some wheeze and a few night sweats. She remembers getting a nebulizer treatment once which gave ability to clear mucus from her airways. She called a television advertisement and got a nebulizer machine and medication from an out of state company. She has used  it only once and got little sputum up with that trial. CT 12/20/10- I reviewed images with her- IMPRESSION:  1. Pulmonary parenchymal pattern of diffuse mild bronchiectasis  and peribronchovascular nodularity is indicative of mycobacterium  avium complex (MAC).  2. Vague area of irregular nodularity in the right lower lobe  (image 32). While this is likely within the spectrum of presumed  MAC, a pulmonary nodule cannot be definitively excluded. If the  patient is at high risk for bronchogenic carcinoma, follow-up chest  CT at 3-6 months is recommended. If the patient is at low risk for  bronchogenic carcinoma, follow-up chest CT at 6-12 months is  recommended. This recommendation follows the consensus statement:  Guidelines for Management of Small Pulmonary Nodules Detected on CT  Scans: A Statement from the Fleischner Society as published in  Radiology 2005; 237:395-400. Online at:  DietDisorder.[email or web address].[employer].edu/rad/res/Fleischner-nodule.htm.  3. Dominant right thyroid nodule with associated calcifications.  Thyroid ultrasound is recommended in further evaluation, as  clinically indicated.  4. Coronary artery calcification.  Original  Report Authenticated By: Reyes IvanMELINDA A. BLIETZ, M.D.   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 MAIC, ? Nodule, thyroid nodule, CAD. C/O sob with exertion, occasional wheezing, and feels like there is a band around her chest Had right hip replacement which relieved her pain. Chest feels okay now. Not coughing. Does bring up a little clear mucus occasionally, no blood. Denies fever or sweat. She is aware she has a chronic right thyroid nodule. CT scan of chest from 12/20/2010 was reviewed last visit. Sputum from 02/01/2011 was  [facility]. We have discussed indications for treatment and the option of continued observation instead of antibacterial treatment for now.  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 Nodule, thyroid nodule, CAD. Cough-productive-yellow/gray in color-noticed its happening more often than about 6 months ago. SOB with long walks.  Since last here, she had right total hip replacement in February and is doing very well. Variable cough produces thick sputum, mostly white. She denies night sweats or fever. Sneezing this spring controlled with OTC antihistamine. CT chest 07/12/11-  IMPRESSION:  1. Pulmonary parenchymal pattern of diffuse peribronchovascular  nodularity, bronchiectasis and scattered mucoid impaction is  indicative of mycobacterium avium complex (MAC).  2. Right lower lobe nodular density is unchanged and likely within  the spectrum of presumed MAC. If further follow-up is desired, CT  chest without contrast in 12 months is recommended.  3. Dominant right thyroid nodule, as before. Recommend further  evaluation with thyroid ultrasound. If patient is clinically  hyperthyroid, consider nuclear medicine thyroid uptake and scan.  Original Report Authenticated By: Reyes Ivan, M.D.   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 Nodule, thyroid nodule, CAD. Feels as though she is wheezing at times in her thoat area. Hoarseness . Culture Pos [facility] Nov, 2012. We started triple therapy with clarithromycin 250 mg x2, rifampin 300 mg x2 and Myambutol 400 mg x3, each taken 3 days per week beginning 08/08/2011 so she has not had 2 months of therapy. She complains of throat tickle and some sneeze. She has been coughing for more than 6 months. Uses tramadol once daily with 2 Tylenol. She declines cough syrup. Chronic back pain/physical therapy.  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 Nodule, thyroid nodule, CAD. We started triple therapy with clarithromycin 250 mg x2, rifampin 300 mg x2 and Myambutol 400 mg  x3, each taken 3 days per week beginning 08/08/2011 Increase in SOB and wheezing since last visit. Still with some of the prednisone taper we gave. Now persistent sense of something in her throat and feels wheeze originating in her throat. Had some laryngitis. She does feel reflux despite Pepcid. Annoying watery rhinorrhea COPD assessment test (CAT) score 10/24 CXR- 10/10/11 IMPRESSION:  1. No radiographic evidence of acute cardiopulmonary disease.  Original Report Authenticated By: Florencia Reasons, M.D.   ROS-See HPI Constitutional:   No-   weight loss, night sweats, fevers, chills, fatigue, lassitude. HEENT:   No-  headaches, difficulty swallowing, tooth/dental problems, sore throat,       No-  sneezing, itching, ear ache, nasal congestion, post nasal drip,  CV:  No-   chest pain, orthopnea, PND, swelling in lower extremities, anasarca, dizziness, palpitations Resp: +  shortness of breath with exertion, Not or at rest.              +  productive cough,  No non-productive cough,  No- coughing up of blood.              No-   change in color of mucus.  + wheezing.   Skin: No-   rash or lesions. GI:  +heartburn, indigestion, no-abdominal pain, nausea, vomiting, GU: . MS:  No-   joint pain or swelling.   Neuro-     nothing unusual Psych:  No- change in mood or affect. No depression or anxiety.  No memory loss.  OBJ General- Alert, Oriented,  Affect-appropriate, Distress- none acute; petite. Skin- rash-none,excoriation- none. Large bruise left mid face Lymphadenopathy- none Head- atraumatic            Eyes- Gross vision intact, PERRLA, conjunctivae clear secretions            Ears- Hearing, canals-normal            Nose- Clear, no-Septal dev, mucus, polyps, erosion, perforation             Throat- Mallampati II , mucosa clear , drainage- none seen, but she is throat clearing, tonsils- atrophic Neck- flexible , trachea midline, no stridor , thyroid+ nodule R, carotid no bruit Chest -  symmetrical excursion , unlabored           Heart/CV- RRR , no murmur , no gallop  , no rub, nl s1 s2                           - JVD- none , edema- none, stasis changes- none, varices- none           Lung- + few crackles,   wheeze- none, cough- none , dullness-none, rub- none           Chest wall-  Abd-  Br/ Gen/ Rectal- Not done, not indicated Extrem- cyanosis- none, clubbing, none, atrophy- none, strength- nl Neuro- grossly intact to observation

## 2012-01-19 DIAGNOSIS — J310 Chronic rhinitis: Secondary | ICD-10-CM | POA: Insufficient documentation

## 2012-01-19 DIAGNOSIS — K219 Gastro-esophageal reflux disease without esophagitis: Secondary | ICD-10-CM | POA: Insufficient documentation

## 2012-01-19 NOTE — Assessment & Plan Note (Signed)
We agreed we would continue triple therapy for 2 more months, ending at the end of December after 6 months total therapy

## 2012-01-19 NOTE — Assessment & Plan Note (Signed)
Plan-change Pepcid to omeprazole for different mechanism of action. Reflux precautions.

## 2012-01-19 NOTE — Assessment & Plan Note (Signed)
Plan- try ipratropium nasal spray with discussion

## 2012-02-07 ENCOUNTER — Telehealth: Payer: Self-pay | Admitting: Internal Medicine

## 2012-02-07 MED ORDER — OMEPRAZOLE 20 MG PO CPDR: 20.0000 mg | DELAYED_RELEASE_CAPSULE | Freq: Every day | ORAL | Status: DC

## 2012-02-07 NOTE — Telephone Encounter (Signed)
Pt return call. Please cb at [phone]

## 2012-02-07 NOTE — Telephone Encounter (Signed)
[facility]

## 2012-02-07 NOTE — Telephone Encounter (Signed)
ATC no answer, LMOMTCB

## 2012-03-12 ENCOUNTER — Ambulatory Visit (INDEPENDENT_AMBULATORY_CARE_PROVIDER_SITE_OTHER)
Admission: RE | Admit: 2012-03-12 | Discharge: 2012-03-12 | Disposition: A | Discharge status: Home / Self Care | Payer: Medicare Other | Source: Ambulatory Visit | Attending: Internal Medicine | Admitting: Internal Medicine

## 2012-03-12 ENCOUNTER — Ambulatory Visit (INDEPENDENT_AMBULATORY_CARE_PROVIDER_SITE_OTHER): Payer: Medicare Other | Admitting: Internal Medicine

## 2012-03-12 ENCOUNTER — Encounter: Payer: Self-pay | Admitting: Internal Medicine

## 2012-03-12 VITALS — BP 112/60 | HR 73 | Ht 60.0 in | Wt 114.4 lb

## 2012-03-12 DIAGNOSIS — A318 Other mycobacterial infections: Secondary | ICD-10-CM

## 2012-03-12 DIAGNOSIS — J42 Unspecified chronic bronchitis: Secondary | ICD-10-CM

## 2012-03-12 DIAGNOSIS — A310 Pulmonary mycobacterial infection: Secondary | ICD-10-CM

## 2012-03-12 NOTE — Progress Notes (Signed)
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 [facility]anone in [facility] because of COPD. She had smoked 2 packs per day for 26 years/52 pack years, ending in 1984. [name] assumes the diagnosis is COPD. She had seen Dr.DeCoy/Pulmonary in the past before he left town. He treated her with Spiriva which has helped. Symbicort 160 has not helped. She is aware of progressive dyspnea on exertion over the last several years. Has had episodes of bronchitis. Daily cough brings yellow and sometimes green or brown sputum. She denies blood. She was hospitalized once with pneumonia but not for other lung problems. She has not had home oxygen. Has nebulizer machine at home but is not using it. Went to an urgent care this summer for bronchitis, treated with prednisone and an antibiotic. Cardiac problems limited to occasional rapid heartbeat. Anemia treated with iron. Has had 3 spinal steroid injections and is followed by a spine specialist. 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 Intolerant of aspirin and Avelox. Asks flu shot. Has had pneumococcal vaccine but not sure of the date.  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 She had smoked 2 packs per day for 26 years/52 pack years, ending in 1984. Has had flu and pneumonia vaccines. Since last here she had fallen and hit her head with bruising. Has aseptic necrosis of the right hip and is seeing Dr.Took/ Ortho, but does not want surgery. Occasional cough and persistent throat clearing, some wheeze and a few night sweats. She remembers getting a nebulizer treatment once which gave ability to clear mucus from her airways. She called a television advertisement and got a nebulizer machine and medication from an out of state company. She has used  it only once and got little sputum up with that trial. CT 12/20/10- I reviewed images with her- IMPRESSION:  1. Pulmonary parenchymal pattern of diffuse mild bronchiectasis  and peribronchovascular nodularity is indicative of mycobacterium  avium complex (MAC).  2. Vague area of irregular nodularity in the right lower lobe  (image 32). While this is likely within the spectrum of presumed  MAC, a pulmonary nodule cannot be definitively excluded. If the  patient is at high risk for bronchogenic carcinoma, follow-up chest  CT at 3-6 months is recommended. If the patient is at low risk for  bronchogenic carcinoma, follow-up chest CT at 6-12 months is  recommended. This recommendation follows the consensus statement:  Guidelines for Management of Small Pulmonary Nodules Detected on CT  Scans: A Statement from the Fleischner Society as published in  Radiology 2005; 237:395-400. Online at:  DietDisorder.[email or web address].[employer].edu/rad/res/Fleischner-nodule.htm.  3. Dominant right thyroid nodule with associated calcifications.  Thyroid ultrasound is recommended in further evaluation, as  clinically indicated.  4. Coronary artery calcification.  Original  Report Authenticated By: Reyes IvanMELINDA A. BLIETZ, M.D.   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 MAIC, ? Nodule, thyroid nodule, CAD. C/O sob with exertion, occasional wheezing, and feels like there is a band around her chest Had right hip replacement which relieved her pain. Chest feels okay now. Not coughing. Does bring up a little clear mucus occasionally, no blood. Denies fever or sweat. She is aware she has a chronic right thyroid nodule. CT scan of chest from 12/20/2010 was reviewed last visit. Sputum from 02/01/2011 was  positive for [facility]. We have discussed indications for treatment and the option of continued observation instead of antibacterial treatment for now.  08/08/11- 79 year old female former smoker,  retired Char[employer] fundraiser, followed for COPD, bronchiectasis/+  MAIC, ? Nodule, thyroid nodule, CAD. Cough-productive-yellow/gray in color-noticed its happening more often than about 6 months ago. SOB with long walks.  Since last here, she had right total hip replacement in February and is doing very well. Variable cough produces thick sputum, mostly white. She denies night sweats or fever. Sneezing this spring controlled with OTC antihistamine. CT chest 07/12/11-  IMPRESSION:  1. Pulmonary parenchymal pattern of diffuse peribronchovascular  nodularity, bronchiectasis and scattered mucoid impaction is  indicative of mycobacterium avium complex (MAC).  2. Right lower lobe nodular density is unchanged and likely within  the spectrum of presumed MAC. If further follow-up is desired, CT  chest without contrast in 12 months is recommended.  3. Dominant right thyroid nodule, as before. Recommend further  evaluation with thyroid ultrasound. If patient is clinically  hyperthyroid, consider nuclear medicine thyroid uptake and scan.  Original Report Authenticated By: Reyes Ivan, M.D.   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 Nodule, thyroid nodule, CAD. Feels as though she is wheezing at times in her thoat area. Hoarseness . Culture Pos [facility] Nov, 2012. We started triple therapy with clarithromycin 250 mg x2, rifampin 300 mg x2 and Myambutol 400 mg x3, each taken 3 days per week beginning 08/08/2011 so she has not had 2 months of therapy. She complains of throat tickle and some sneeze. She has been coughing for more than 6 months. Uses tramadol once daily with 2 Tylenol. She declines cough syrup. Chronic back pain/physical therapy.  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 Nodule, thyroid nodule, CAD. We started triple therapy with clarithromycin 250 mg x2, rifampin 300 mg x2 and Myambutol 400 mg  x3, each taken 3 days per week beginning 08/08/2011 Increase in SOB and wheezing since last visit. Still with some of the prednisone taper we gave. Now persistent sense of something in her throat and feels wheeze originating in her throat. Had some laryngitis. She does feel reflux despite Pepcid. Annoying watery rhinorrhea COPD assessment test (CAT) score 10/24 CXR- 10/10/11 IMPRESSION:  1. No radiographic evidence of acute cardiopulmonary disease.  Original Report Authenticated By: Florencia Reasons, M.D.   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 Nodule, thyroid nodule, CAD. We started triple therapy with clarithromycin 250 mg x2, rifampin 300 mg x2 and Myambutol 400 mg x3, each taken 3 days per week beginning 08/08/2011 FOLLOWS FOR: no more SOB and wheezing than at last visit; cough-productive Denies night sweats or fever. Sputum is mostly clear. CXR 8/6 / 13- to watch right lower lung zone for possible atelectasis IMPRESSION:  1. No radiographic evidence of acute cardiopulmonary disease.  Original Report Authenticated By: Florencia Reasons, M.D.  ROS-See HPI Constitutional:   No-   weight loss, night sweats, fevers, chills, fatigue, lassitude. HEENT:   No-  headaches, difficulty swallowing, tooth/dental problems, sore throat,       No-  sneezing, itching, ear ache, nasal congestion, post nasal drip,  CV:  No-   chest pain, orthopnea, PND, swelling in lower extremities, anasarca, dizziness, palpitations Resp: +  shortness of breath with exertion, Not or at rest.              +  productive cough,  No non-productive cough,  No- coughing up of blood.              No-   change in color of mucus.  + wheezing.   Skin: No-   rash or lesions. GI:  +heartburn, indigestion, no-abdominal pain, nausea, vomiting, GU: . MS:  No-   joint pain or swelling.   Neuro-     nothing unusual Psych:  No- change in mood or affect. No depression or  anxiety.  No memory loss.  OBJ General- Alert, Oriented, Affect-appropriate, Distress- none acute; petite elderly woman. She appears well. Skin- rash-none,excoriation- none. Large bruise left mid face Lymphadenopathy- none Head- atraumatic            Eyes- Gross vision intact, PERRLA, conjunctivae clear secretions            Ears- Hearing, canals-normal            Nose- Clear, no-Septal dev, mucus, polyps, erosion, perforation             Throat- Mallampati II , mucosa clear , drainage- none  tonsils- atrophic Neck- flexible , trachea midline, no stridor , thyroid+ nodule R, carotid no bruit Chest - symmetrical excursion , unlabored           Heart/CV- RRR , no murmur , no gallop  , no rub, nl s1 s2                           - JVD- none , edema- none, stasis changes- none, varices- none           Lung- + few crackles in bases,   wheeze- none, cough- none , dullness-none, rub- none           Chest wall-  Abd-  Br/ Gen/ Rectal- Not done, not indicated Extrem- cyanosis- none, clubbing, none, atrophy- none, strength- nl Neuro- grossly intact to observation

## 2012-03-12 NOTE — Patient Instructions (Addendum)
Order- CXR   Dx chronic bronchitis, MAIC  Order- Sputum culture and smear for [employer]       Finish your triple antibiotics and stop  Please call as needed

## 2012-03-12 NOTE — Progress Notes (Signed)
Quick Note:  Spoke with pt and notified of results per Dr. Young. Pt verbalized understanding and denied any questions.  ______ 

## 2012-03-17 ENCOUNTER — Other Ambulatory Visit: Payer: Medicare Other

## 2012-03-17 DIAGNOSIS — J42 Unspecified chronic bronchitis: Secondary | ICD-10-CM

## 2012-03-17 DIAGNOSIS — A310 Pulmonary mycobacterial infection: Secondary | ICD-10-CM

## 2012-03-20 LAB — RESPIRATORY CULTURE OR RESPIRATORY AND SPUTUM CULTURE

## 2012-03-22 NOTE — Assessment & Plan Note (Signed)
Now after 8 months of therapy, residual cough produces only clear sputum and she is without systemic symptoms. Plan-sputum culture for [employer], chest x-ray. Finish the remaining supply, about 2 weeks, of her 3 medications and then stop.

## 2012-03-24 NOTE — Progress Notes (Signed)
Quick Note:  I called [facility]-had to leave message for [employer] micro department to call me back directly to have test for sensitivity to [employer] drugs ordered. ______

## 2012-03-24 NOTE — Progress Notes (Signed)
Quick Note:  Malachi Bonds from [facility] called-she has added the test on and will take about 3-5 weeks to get results-I requested the results be faxed to my attn at [phone]. ______

## 2012-05-04 NOTE — Progress Notes (Signed)
Quick Note:  Pt aware of results. ______ 

## 2012-05-12 ENCOUNTER — Encounter: Payer: Self-pay | Admitting: Internal Medicine

## 2012-05-12 ENCOUNTER — Ambulatory Visit (INDEPENDENT_AMBULATORY_CARE_PROVIDER_SITE_OTHER)
Admission: RE | Admit: 2012-05-12 | Discharge: 2012-05-12 | Disposition: A | Discharge status: Home / Self Care | Payer: Medicare Other | Source: Ambulatory Visit | Attending: Internal Medicine | Admitting: Internal Medicine

## 2012-05-12 ENCOUNTER — Ambulatory Visit (INDEPENDENT_AMBULATORY_CARE_PROVIDER_SITE_OTHER): Payer: Medicare Other | Admitting: Internal Medicine

## 2012-05-12 VITALS — BP 132/70 | HR 68 | Ht 60.0 in | Wt 115.6 lb

## 2012-05-12 DIAGNOSIS — J42 Unspecified chronic bronchitis: Secondary | ICD-10-CM

## 2012-05-12 DIAGNOSIS — J449 Chronic obstructive pulmonary disease, unspecified: Secondary | ICD-10-CM

## 2012-05-12 DIAGNOSIS — A310 Pulmonary mycobacterial infection: Secondary | ICD-10-CM

## 2012-05-12 MED ORDER — CIPROFLOXACIN HCL 500 MG PO TABS: 500.0000 mg | ORAL_TABLET | Freq: Two times a day (BID) | ORAL | Status: DC

## 2012-05-12 NOTE — Assessment & Plan Note (Signed)
Culture neg. Hope this stays resolved.

## 2012-05-12 NOTE — Patient Instructions (Addendum)
Script sent for Cipro antibiotic- we are trying to clear the pseudomonas bacterial bronchitis  Order- CXR  Dx chronic bronchitis  Please call as needed

## 2012-05-12 NOTE — Progress Notes (Signed)
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 W[facility]Zanone in [facility] because of COPD. She had smoked 2 packs per day for 26 years/52 pack years, ending in 1984. [name] assumes the diagnosis is COPD. She had seen Dr.DeCoy/Pulmonary in the past before he left town. He treated her with Spiriva which has helped. Symbicort 160 has not helped. She is aware of progressive dyspnea on exertion over the last several years. Has had episodes of bronchitis. Daily cough brings yellow and sometimes green or brown sputum. She denies blood. She was hospitalized once with pneumonia but not for other lung problems. She has not had home oxygen. Has nebulizer machine at home but is not using it. Went to an urgent care this summer for bronchitis, treated with prednisone and an antibiotic. Cardiac problems limited to occasional rapid heartbeat. Anemia treated with iron. Has had 3 spinal steroid injections and is followed by a spine specialist. 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 Intolerant of aspirin and Avelox. Asks flu shot. Has had pneumococcal vaccine but not sure of the date.  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 She had smoked 2 packs per day for 26 years/52 pack years, ending in 1984. Has had flu and pneumonia vaccines. Since last here she had fallen and hit her head with bruising. Has aseptic necrosis of the right hip and is seeing Dr.Took/ Ortho, but does not want surgery. Occasional cough and persistent throat clearing, some wheeze and a few night sweats. She remembers getting a nebulizer treatment once which gave ability to clear mucus from her airways. She called a television advertisement and got a nebulizer machine and medication from an out of state company. She has used  it only once and got little sputum up with that trial. CT 12/20/10- I reviewed images with her- IMPRESSION:  1. Pulmonary parenchymal pattern of diffuse mild bronchiectasis  and peribronchovascular nodularity is indicative of mycobacterium  avium complex (MAC).  2. Vague area of irregular nodularity in the right lower lobe  (image 32). While this is likely within the spectrum of presumed  MAC, a pulmonary nodule cannot be definitively excluded. If the  patient is at high risk for bronchogenic carcinoma, follow-up chest  CT at 3-6 months is recommended. If the patient is at low risk for  bronchogenic carcinoma, follow-up chest CT at 6-12 months is  recommended. This recommendation follows the consensus statement:  Guidelines for Management of Small Pulmonary Nodules Detected on CT  Scans: A Statement from the Fleischner Society as published in  Radiology 2005; 237:395-400. Online at:  DietDisorder.czhttp://www.med.[employer].edu/rad/res/Fleischner-nodule.htm.  3. Dominant right thyroid nodule with associated calcifications.  Thyroid ultrasound is recommended in further evaluation, as  clinically indicated.  4. Coronary artery calcification.  Original  Report Authenticated By: Reyes IvanMELINDA A. BLIETZ, M.D.   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 MAIC, ? Nodule, thyroid nodule, CAD. C/O sob with exertion, occasional wheezing, and feels like there is a band around her chest Had right hip replacement which relieved her pain. Chest feels okay now. Not coughing. Does bring up a little clear mucus occasionally, no blood. Denies fever or sweat. She is aware she has a chronic right thyroid nodule. CT scan of chest from 12/20/2010 was reviewed last visit. Sputum from 02/01/2011 was  [facility]. We have discussed indications for treatment and the option of continued observation instead of antibacterial treatment for now.  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 Nodule, thyroid nodule, CAD. Cough-productive-yellow/gray in color-noticed its happening more often than about 6 months ago. SOB with long walks.  Since last here, she had right total hip replacement in February and is doing very well. Variable cough produces thick sputum, mostly white. She denies night sweats or fever. Sneezing this spring controlled with OTC antihistamine. CT chest 07/12/11-  IMPRESSION:  1. Pulmonary parenchymal pattern of diffuse peribronchovascular  nodularity, bronchiectasis and scattered mucoid impaction is  indicative of mycobacterium avium complex (MAC).  2. Right lower lobe nodular density is unchanged and likely within  the spectrum of presumed MAC. If further follow-up is desired, CT  chest without contrast in 12 months is recommended.  3. Dominant right thyroid nodule, as before. Recommend further  evaluation with thyroid ultrasound. If patient is clinically  hyperthyroid, consider nuclear medicine thyroid uptake and scan.  Original Report Authenticated By: Reyes IvanMELINDA A. BLIETZ, M.D.   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 Nodule, thyroid nodule, CAD. Feels as though she is wheezing at times in her thoat area. Hoarseness . [facility]. We started triple therapy with clarithromycin 250 mg x2, rifampin 300 mg x2 and Myambutol 400 mg x3, each taken 3 days per week beginning 08/08/2011 so she has not had 2 months of therapy. She complains of throat tickle and some sneeze. She has been coughing for more than 6 months. Uses tramadol once daily with 2 Tylenol. She declines cough syrup. Chronic back pain/physical therapy.  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 Nodule, thyroid nodule, CAD. We started triple therapy with clarithromycin 250 mg x2, rifampin 300 mg x2 and Myambutol 400 mg  x3, each taken 3 days per week beginning 08/08/2011 Increase in SOB and wheezing since last visit. Still with some of the prednisone taper we gave. Now persistent sense of something in her throat and feels wheeze originating in her throat. Had some laryngitis. She does feel reflux despite Pepcid. Annoying watery rhinorrhea COPD assessment test (CAT) score 10/24 CXR- 10/10/11 IMPRESSION:  1. No radiographic evidence of acute cardiopulmonary disease.  Original Report Authenticated By: Florencia ReasonsANIEL W. ENTRIKIN, M.D.   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 Nodule, thyroid nodule, CAD. We started triple therapy with clarithromycin 250 mg x2, rifampin 300 mg x2 and Myambutol 400 mg x3, each taken 3 days per week beginning 08/08/2011 FOLLOWS FOR: no more SOB and wheezing than at last visit; cough-productive Denies night sweats or fever. Sputum is mostly clear. CXR 8/6 / 13- to watch right lower lung zone for possible atelectasis IMPRESSION:  1. No radiographic evidence of acute cardiopulmonary disease.  Original Report Authenticated By: Florencia ReasonsANIEL W. ENTRIKIN, M.D.  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 Nodule, thyroid nodule, CAD. We started triple therapy with clarithromycin 250 mg x2, rifampin 300 mg x2 and Myambutol 400 mg x3, each taken 3 days per week beginning 08/08/2011/ end 03/26/12. FOLLOWS FOR: Has noticed increased SOB with activity Cough is now mostly dry. Once or twice has seen tiny clot from back of throat.  Denies fever, sweat, nodes, chest pain, wheeze, edema.  Sputum Cx 03/17/12  Neg AFB smear and culture                                    Pos- broadly sens pseudomonas CXR 03/12/12- images and report reviewed with her.  IMPRESSION:  1. Lingular subsegmental atelectasis or scar.  2. Thoracic spondylosis and kyphosis.  3. Dextroconvex lumbar scoliosis.  4. Atherosclerosis.   Original Report Authenticated By: Gaylyn Rong, M.D   ROS-See HPI Constitutional:   No-   weight loss, night sweats, fevers, chills, fatigue, lassitude. HEENT:   No-  headaches, difficulty swallowing, tooth/dental problems, sore throat,       No-  sneezing, itching, ear ache, nasal congestion, post nasal drip,  CV:  No-   chest pain, orthopnea, PND, swelling in lower extremities, anasarca, dizziness, palpitations Resp: +  shortness of breath with exertion, Not or at rest.              +  productive cough,  + non-productive cough,  No- coughing up of blood.              little-   change in color of mucus.  + wheezing.   Skin: No-   rash or lesions. GI:  No-heartburn, indigestion, no-abdominal pain, nausea, vomiting, GU: . MS:  No-   joint pain or swelling.   Neuro-     nothing unusual Psych:  No- change in mood or affect. No depression or anxiety.  No memory loss.  OBJ General- Alert, Oriented, Affect-appropriate, Distress- none acute; petite elderly woman. She appears well. Skin- rash-none,excoriation- none.  Lymphadenopathy- none Head- atraumatic            Eyes- Gross vision intact, PERRLA, conjunctivae clear secretions            Ears- Hearing, canals-normal            Nose- Clear, no-Septal dev, mucus, polyps, erosion, perforation             Throat- Mallampati II , mucosa clear , drainage- none  tonsils- atrophic Neck- flexible , trachea midline, no stridor , thyroid+ nodule R, carotid no bruit Chest - symmetrical excursion , unlabored           Heart/CV- RRR , no murmur , no gallop  , no rub, nl s1 s2                           - JVD- none , edema- none, stasis changes- none, varices- none           Lung- + few rhonchi L base,   wheeze- none, cough- none , dullness-none, rub- none           Chest wall- kyphosis Abd-  Br/ Gen/ Rectal- Not done, not indicated Extrem- cyanosis- none, clubbing, none, atrophy- none, strength- nl Neuro- grossly intact to  observation

## 2012-05-12 NOTE — Assessment & Plan Note (Signed)
Chronic bronchitis/ bronchiectasis. Cultured pseudomonas Jan, 2014. With Increased rhonchi, will treat with Cipro. She had non-specific problem once with avelox,but thinks she has taken cipro successfully.

## 2012-05-25 LAB — AFB CULTURE WITH SMEAR (NOT AT ARMC): Acid Fast Smear: NONE SEEN

## 2012-05-28 NOTE — Progress Notes (Signed)
Pt left without being seen.

## 2012-07-13 ENCOUNTER — Ambulatory Visit (INDEPENDENT_AMBULATORY_CARE_PROVIDER_SITE_OTHER): Payer: Medicare Other | Admitting: Internal Medicine

## 2012-07-13 ENCOUNTER — Encounter: Payer: Self-pay | Admitting: Internal Medicine

## 2012-07-13 VITALS — BP 138/80 | HR 75 | Ht 59.0 in | Wt 115.0 lb

## 2012-07-13 DIAGNOSIS — J449 Chronic obstructive pulmonary disease, unspecified: Secondary | ICD-10-CM

## 2012-07-13 DIAGNOSIS — A310 Pulmonary mycobacterial infection: Secondary | ICD-10-CM

## 2012-07-13 MED ORDER — ALBUTEROL SULFATE HFA 108 (90 BASE) MCG/ACT IN AERS: 2.0000 | INHALATION_SPRAY | Freq: Four times a day (QID) | RESPIRATORY_TRACT | Status: DC | PRN

## 2012-07-13 MED ORDER — BENZONATATE 100 MG PO CAPS: 100.0000 mg | ORAL_CAPSULE | Freq: Four times a day (QID) | ORAL | Status: DC | PRN

## 2012-07-13 MED ORDER — TIOTROPIUM BROMIDE MONOHYDRATE 18 MCG IN CAPS: 18.0000 ug | ORAL_CAPSULE | Freq: Every day | RESPIRATORY_TRACT | Status: DC

## 2012-07-13 NOTE — Progress Notes (Signed)
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 because [facility] [place]. She had smoked 2 packs per day for 26 years/52 pack years, ending in 1984. [name] assumes the diagnosis is COPD. She had seen Dr.DeCoy/Pulmonary in the past before he left town. He treated her with Spiriva which has helped. Symbicort 160 has not helped. She is aware of progressive dyspnea on exertion over the last several years. Has had episodes of bronchitis. Daily cough brings yellow and sometimes green or brown sputum. She denies blood. She was hospitalized once with pneumonia but not for other lung problems. She has not had home oxygen. Has nebulizer machine at home but is not using it. Went to an urgent care this summer for bronchitis, treated with prednisone and an antibiotic. Cardiac problems limited to occasional rapid heartbeat. Anemia treated with iron. Has had 3 spinal steroid injections and is followed by a spine specialist. 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 Intolerant of aspirin and Avelox. Asks flu shot. Has had pneumococcal vaccine but not sure of the date.  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 She had smoked 2 packs per day for 26 years/52 pack years, ending in 1984. Has had flu and pneumonia vaccines. Since last here she had fallen and hit her head with bruising. Has aseptic necrosis of the right hip and is seeing Dr.Took/ Ortho, but does not want surgery. Occasional cough and persistent throat clearing, some wheeze and a few night sweats. She remembers getting a nebulizer treatment once which gave ability to clear mucus from her airways. She called a television advertisement and got a nebulizer machine and medication from an out of state company. She has used  it only once and got little sputum up with that trial. CT 12/20/10- I reviewed images with her- IMPRESSION:  1. Pulmonary parenchymal pattern of diffuse mild bronchiectasis  and peribronchovascular nodularity is indicative of mycobacterium  avium complex (MAC).  2. Vague area of irregular nodularity in the right lower lobe  (image 32). While this is likely within the spectrum of presumed  MAC, a pulmonary nodule cannot be definitively excluded. If the  patient is at high risk for bronchogenic carcinoma, follow-up chest  CT at 3-6 months is recommended. If the patient is at low risk for  bronchogenic carcinoma, follow-up chest CT at 6-12 months is  recommended. This recommendation follows the consensus statement:  Guidelines for Management of Small Pulmonary Nodules Detected on CT  Scans: A Statement from the Fleischner Society as published in  Radiology 2005; 237:395-400. Online at:  DietDisorder.czhttp://www.med.[employer].edu/rad/res/Fleischner-nodule.htm.  3. Dominant right thyroid nodule with associated calcifications.  Thyroid ultrasound is recommended in further evaluation, as  clinically indicated.  4. Coronary artery calcification.  Original  Report Authenticated By: Reyes IvanMELINDA A. BLIETZ, M.D.   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 MAIC, ? Nodule, thyroid nodule, CAD. C/O sob with exertion, occasional wheezing, and feels like there is a band around her chest Had right hip replacement which relieved her pain. Chest feels okay now. Not coughing. Does bring up a little clear mucus occasionally, no blood. Denies fever or sweat. She is aware she has a chronic right thyroid nodule. CT scan of chest from 12/20/2010 was reviewed last visit. Sputum from 02/01/2011 was  positive for [facility]. We have discussed indications for treatment and the option of continued observation instead of antibacterial treatment for now.  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 Nodule, thyroid nodule, CAD. Cough-productive-yellow/gray in color-noticed its happening more often than about 6 months ago. SOB with long walks.  Since last here, she had right total hip replacement in February and is doing very well. Variable cough produces thick sputum, mostly white. She denies night sweats or fever. Sneezing this spring controlled with OTC antihistamine. CT chest 07/12/11-  IMPRESSION:  1. Pulmonary parenchymal pattern of diffuse peribronchovascular  nodularity, bronchiectasis and scattered mucoid impaction is  indicative of mycobacterium avium complex (MAC).  2. Right lower lobe nodular density is unchanged and likely within  the spectrum of presumed MAC. If further follow-up is desired, CT  chest without contrast in 12 months is recommended.  3. Dominant right thyroid nodule, as before. Recommend further  evaluation with thyroid ultrasound. If patient is clinically  hyperthyroid, consider nuclear medicine thyroid uptake and scan.  Original Report Authenticated By: Reyes IvanMELINDA A. BLIETZ, M.D.   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 Nodule, thyroid nodule, CAD. Feels as though she is wheezing at times in her thoat area. Hoarseness . [facility] Nov, 2012. We started triple therapy with clarithromycin 250 mg x2, rifampin 300 mg x2 and Myambutol 400 mg x3, each taken 3 days per week beginning 08/08/2011 so she has not had 2 months of therapy. She complains of throat tickle and some sneeze. She has been coughing for more than 6 months. Uses tramadol once daily with 2 Tylenol. She declines cough syrup. Chronic back pain/physical therapy.  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 Nodule, thyroid nodule, CAD. We started triple therapy with clarithromycin 250 mg x2, rifampin 300 mg x2 and Myambutol 400 mg  x3, each taken 3 days per week beginning 08/08/2011 Increase in SOB and wheezing since last visit. Still with some of the prednisone taper we gave. Now persistent sense of something in her throat and feels wheeze originating in her throat. Had some laryngitis. She does feel reflux despite Pepcid. Annoying watery rhinorrhea COPD assessment test (CAT) score 10/24 CXR- 10/10/11 IMPRESSION:  1. No radiographic evidence of acute cardiopulmonary disease.  Original Report Authenticated By: Florencia ReasonsANIEL W. ENTRIKIN, M.D.   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 Nodule, thyroid nodule, CAD. We started triple therapy with clarithromycin 250 mg x2, rifampin 300 mg x2 and Myambutol 400 mg x3, each taken 3 days per week beginning 08/08/2011 FOLLOWS FOR: no more SOB and wheezing than at last visit; cough-productive Denies night sweats or fever. Sputum is mostly clear. CXR 8/6 / 13- to watch right lower lung zone for possible atelectasis IMPRESSION:  1. No radiographic evidence of acute cardiopulmonary disease.  Original Report Authenticated By: Florencia ReasonsANIEL W. ENTRIKIN, M.D.  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 Nodule, thyroid nodule, CAD. We started triple therapy with clarithromycin 250 mg x2, rifampin 300 mg x2 and Myambutol 400 mg x3, each taken 3 days per week beginning 08/08/2011/ end 03/26/12. FOLLOWS FOR: Has noticed increased SOB with activity Cough is now mostly dry. Once or twice has seen tiny clot from back of throat.  Denies fever, sweat, nodes, chest pain, wheeze, edema.  Sputum Cx 03/17/12  Neg AFB smear and culture                                    Pos- broadly sens pseudomonas CXR 03/12/12- images and report reviewed with her.  IMPRESSION:  1. Lingular subsegmental atelectasis or scar.  2. Thoracic spondylosis and kyphosis.  3. Dextroconvex lumbar scoliosis.  4. Atherosclerosis.   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 Nodule, thyroid nodule, CAD. We started triple therapy with clarithromycin 250 mg x2, rifampin 300 mg x2 and Myambutol 400 mg x3, each taken 3 days per week beginning 08/08/2011/ end 03/26/12 FOLLOWS FOR: non productive cough and continues to notice SOB at times. Productive cough has been dry since Cipro for pseudomonas from 03/2012 sputum Cx. AFB was Neg. Occ slight wheeze. No fever, sweat, nodes or chest pain. CXR 05/19/12  IMPRESSION:  1. No acute cardiopulmonary findings.  2. Degenerative osteophytosis of the thoracic spine.  Original Report Authenticated By: Genevive Bi, M.D.  ROS-See HPI Constitutional:   No-   weight loss, night sweats, fevers, chills, fatigue, lassitude. HEENT:   No-  headaches, difficulty swallowing, tooth/dental problems, sore throat,       No-  sneezing, itching, ear ache, nasal congestion, post nasal drip,  CV:  No-   chest pain, orthopnea, PND, swelling in lower extremities, anasarca, dizziness, palpitations Resp: +  shortness of breath with exertion, Not or at rest.              No- productive cough,  + non-productive cough,  No- coughing up of blood.              little-   change in color of mucus.  + wheezing.   Skin: No-   rash or lesions. GI:  No-heartburn, indigestion, no-abdominal pain, nausea, vomiting, GU: . MS:  No-   joint pain or swelling.   Neuro-     nothing unusual Psych:  No- change in mood or affect. No depression or anxiety.  No memory loss.  OBJ General- Alert, Oriented, Affect-appropriate, Distress- none acute; petite elderly woman. She appears well. Skin- rash-none,excoriation- none.  Lymphadenopathy- none Head- atraumatic            Eyes- Gross vision intact, PERRLA, conjunctivae clear secretions            Ears- Hearing, canals-normal            Nose- Clear, no-Septal  dev, mucus, polyps, erosion, perforation             Throat- Mallampati II , mucosa clear , drainage- none  tonsils- atrophic Neck- flexible , trachea midline, no stridor , thyroid+ nodule R, carotid no bruit Chest - symmetrical excursion , unlabored           Heart/CV- RRR , no murmur , no gallop  , no rub, nl s1 s2                           - JVD- none , edema- none, stasis changes- none, varices- none           Lung- + slight wheeze on forced expiration, cough- none , dullness-none, rub- none           Chest wall- kyphosis Abd-  Br/ Gen/ Rectal- Not done, not indicated  Extrem- cyanosis- none, clubbing, none, atrophy- none, strength- nl Neuro- grossly intact to observation

## 2012-07-13 NOTE — Assessment & Plan Note (Signed)
Pseudomonas bronchitis responded to Cipro and seems to be resolved.  Plan- refill rescue inhaler and Spiriva w/ medication talk.

## 2012-07-13 NOTE — Patient Instructions (Addendum)
Script sent to [facility] for tessalon/ benzonatate for cough  Scripts sent to [facility] for albuterol HFA rescue inhaler and for Spiriva  Please call as needed

## 2012-07-13 NOTE — Assessment & Plan Note (Signed)
No recurrence clinically now almost 4 months after finishing Rx.

## 2012-07-17 ENCOUNTER — Telehealth: Payer: Self-pay | Admitting: Internal Medicine

## 2012-07-17 MED ORDER — TIOTROPIUM BROMIDE MONOHYDRATE 18 MCG IN CAPS: 18.0000 ug | ORAL_CAPSULE | Freq: Every day | RESPIRATORY_TRACT | Status: DC

## 2012-07-17 NOTE — Telephone Encounter (Signed)
Called and lmom to make the pt aware of her refill of the spiriva has been sent to the mail order pharmacy per pts request.

## 2012-07-20 ENCOUNTER — Telehealth: Payer: Self-pay | Admitting: Internal Medicine

## 2012-07-20 MED ORDER — ALBUTEROL SULFATE HFA 108 (90 BASE) MCG/ACT IN AERS: 2.0000 | INHALATION_SPRAY | Freq: Four times a day (QID) | RESPIRATORY_TRACT | Status: DC | PRN

## 2012-07-20 NOTE — Telephone Encounter (Signed)
We can change to Proair with same sig. Thanks.

## 2012-07-20 NOTE — Telephone Encounter (Signed)
Rx has been sent in per CY. 

## 2012-07-20 NOTE — Telephone Encounter (Signed)
Please advise if okay to change proventil to proair or ventolin please Thanks! Allergies  Allergen Reactions  . Avelox (Moxifloxacin Hcl In Nacl) Other (See Comments)    Flushes face  . Nsaids     Does not take due to kidney disease

## 2012-07-23 ENCOUNTER — Telehealth: Payer: Self-pay | Admitting: Internal Medicine

## 2012-07-23 NOTE — Telephone Encounter (Signed)
ATC was on hold x 5 mins. WCB

## 2012-07-24 NOTE — Telephone Encounter (Signed)
I spoke with [facility] and they states that ventolin and proair are covered alternatives so I gave the ok to change to ventolin. [id number].  Carron Curie, CMA]

## 2012-07-24 NOTE — Telephone Encounter (Signed)
Pt aware of change and nothing further needed.

## 2012-08-08 DIAGNOSIS — M810 Age-related osteoporosis without current pathological fracture: Secondary | ICD-10-CM | POA: Insufficient documentation

## 2012-08-08 DIAGNOSIS — I701 Atherosclerosis of renal artery: Secondary | ICD-10-CM | POA: Insufficient documentation

## 2012-08-10 ENCOUNTER — Encounter: Payer: Self-pay | Admitting: Podiatry

## 2012-08-10 ENCOUNTER — Ambulatory Visit (INDEPENDENT_AMBULATORY_CARE_PROVIDER_SITE_OTHER): Payer: Medicare Other | Admitting: Podiatry

## 2012-08-10 VITALS — BP 122/77 | HR 66

## 2012-08-10 DIAGNOSIS — M205X2 Other deformities of toe(s) (acquired), left foot: Secondary | ICD-10-CM

## 2012-08-10 DIAGNOSIS — M25579 Pain in unspecified ankle and joints of unspecified foot: Secondary | ICD-10-CM

## 2012-08-10 DIAGNOSIS — M25572 Pain in left ankle and joints of left foot: Secondary | ICD-10-CM

## 2012-08-10 DIAGNOSIS — M205X9 Other deformities of toe(s) (acquired), unspecified foot: Secondary | ICD-10-CM

## 2012-08-10 NOTE — Progress Notes (Signed)
Subjective: 79 y.o. year old female patient presents complaining of painful nails. Patient requests toe nails, corns and calluses trimmed.   Review of Systems - General ROS: negative for - fatigue, fever, night sweats, sleep disturbance, weight gain or weight loss ENT ROS: negative Allergy and Immunology ROS: negative Endocrine ROS: negative Respiratory ROS: Mild case of emphysema and occasional short of breath with some activity. Cardiovascular ROS: no chest pain or dyspnea on exertion Gastrointestinal ROS: no abdominal pain, change in bowel habits, or black or bloody stools Musculoskeletal ROS: Having back problem with Scoliosis.  Objective: Dermatologic: Thick yellow deformed nails x 10.   Vascular: Pedal pulses are all palpable. Orthopedic: Contracted lesser digits with deviated 2nd and 3rd left. Overlapping 2nd digit over the hallux and cause pain in shoes. Neurologic: All epicritic and tactile sensations grossly intact.  Assessment: Overlapping 1st and 2nd with pain on 2nd left foot.  Treatment: Reviewed findings and available treatment options. Patient elected to have Tenotomy with Capsulotomy of the 2nd MPJ left foot at the office.

## 2012-08-17 ENCOUNTER — Ambulatory Visit (INDEPENDENT_AMBULATORY_CARE_PROVIDER_SITE_OTHER): Payer: Medicare Other | Admitting: Podiatry

## 2012-08-17 VITALS — BP 94/64 | HR 78

## 2012-08-17 DIAGNOSIS — M205X2 Other deformities of toe(s) (acquired), left foot: Secondary | ICD-10-CM

## 2012-08-17 DIAGNOSIS — M205X9 Other deformities of toe(s) (acquired), unspecified foot: Secondary | ICD-10-CM

## 2012-08-18 NOTE — Progress Notes (Signed)
Patient came in to have an office procedure, 2nd MPJ capsulotomy left foot. Patient was also concerned with the 3rd digit that is going off the same direction as the 2nd.  To discuss further, radiographic examination of the left foot was done. Noted of severely subluxed 2nd and 3rd MPJ to medial direction.   Assessment: Subluxed 2nd and 3rd MPJ left foot.  Plan:  Due to multiple problems that involving the 2nd and 3rd digits, patient decided to have the procedures done at the surgery center.  Consent form reviewed for Capsulotomy of 2nd and 3rd MPJ with V to Y skin plasty over 2nd MPJ left foot. All questions answered. Patient will contact us when patient can get ride to surgery center.

## 2012-09-25 DIAGNOSIS — K644 Residual hemorrhoidal skin tags: Secondary | ICD-10-CM | POA: Insufficient documentation

## 2012-09-25 DIAGNOSIS — M5137 Other intervertebral disc degeneration, lumbosacral region: Secondary | ICD-10-CM | POA: Insufficient documentation

## 2012-09-25 DIAGNOSIS — M51379 Other intervertebral disc degeneration, lumbosacral region without mention of lumbar back pain or lower extremity pain: Secondary | ICD-10-CM | POA: Insufficient documentation

## 2012-11-16 ENCOUNTER — Encounter: Payer: Self-pay | Admitting: Internal Medicine

## 2012-11-16 ENCOUNTER — Ambulatory Visit (INDEPENDENT_AMBULATORY_CARE_PROVIDER_SITE_OTHER): Payer: Medicare Other | Admitting: Internal Medicine

## 2012-11-16 VITALS — BP 120/78 | HR 77 | Ht 59.0 in | Wt 113.4 lb

## 2012-11-16 DIAGNOSIS — A310 Pulmonary mycobacterial infection: Secondary | ICD-10-CM

## 2012-11-16 DIAGNOSIS — Z23 Encounter for immunization: Secondary | ICD-10-CM

## 2012-11-16 DIAGNOSIS — J449 Chronic obstructive pulmonary disease, unspecified: Secondary | ICD-10-CM

## 2012-11-16 NOTE — Progress Notes (Signed)
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 [place] [facility]one [facility] because of COPD. She had smoked 2 packs per day for 26 years/52 pack years, ending in 1984. [name] assumes the diagnosis is COPD. She had seen Dr.DeCoy/Pulmonary in the past before he left town. He treated her with Spiriva which has helped. Symbicort 160 has not helped. She is aware of progressive dyspnea on exertion over the last several years. Has had episodes of bronchitis. Daily cough brings yellow and sometimes green or brown sputum. She denies blood. She was hospitalized once with pneumonia but not for other lung problems. She has not had home oxygen. Has nebulizer machine at home but is not using it. Went to an urgent care this summer for bronchitis, treated with prednisone and an antibiotic. Cardiac problems limited to occasional rapid heartbeat. Anemia treated with iron. Has had 3 spinal steroid injections and is followed by a spine specialist. 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 Intolerant of aspirin and Avelox. Asks flu shot. Has had pneumococcal vaccine but not sure of the date.  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 She had smoked 2 packs per day for 26 years/52 pack years, ending in 1984. Has had flu and pneumonia vaccines. Since last here she had fallen and hit her head with bruising. Has aseptic necrosis of the right hip and is seeing Dr.Took/ Ortho, but does not want surgery. Occasional cough and persistent throat clearing, some wheeze and a few night sweats. She remembers getting a nebulizer treatment once which gave ability to clear mucus from her airways. She called a television advertisement and got a nebulizer machine and medication from an out of state company. She has used  it only once and got little sputum up with that trial. CT 12/20/10- I reviewed images with her- IMPRESSION:  1. Pulmonary parenchymal pattern of diffuse mild bronchiectasis  and peribronchovascular nodularity is indicative of mycobacterium  avium complex (MAC).  2. Vague area of irregular nodularity in the right lower lobe  (image 32). While this is likely within the spectrum of presumed  MAC, a pulmonary nodule cannot be definitively excluded. If the  patient is at high risk for bronchogenic carcinoma, follow-up chest  CT at 3-6 months is recommended. If the patient is at low risk for  bronchogenic carcinoma, follow-up chest CT at 6-12 months is  recommended. This recommendation follows the consensus statement:  Guidelines for Management of Small Pulmonary Nodules Detected on CT  Scans: A Statement from the Fleischner Society as published in  Radiology 2005; 237:395-400. Online at:  DietDisorder.czhttp://www.med.[employer].edu/rad/res/Fleischner-nodule.htm.  3. Dominant right thyroid nodule with associated calcifications.  Thyroid ultrasound is recommended in further evaluation, as  clinically indicated.  4. Coronary artery calcification.  Original  Report Authenticated By: [name], M.D.   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 MAIC, ? Nodule, thyroid nodule, CAD. C/O sob with exertion, occasional wheezing, and feels like there is a band around her chest Had right hip replacement which relieved her pain. Chest feels okay now. Not coughing. Does bring up a little clear mucus occasionally, no blood. Denies fever or sweat. She is aware she has a chronic right thyroid nodule. CT scan of chest from 12/20/2010 was reviewed last visit. Sputum from 02/01/2011 was  [facility]. We have discussed indications for treatment and the option of continued observation instead of antibacterial treatment for now.  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 Nodule, thyroid nodule, CAD. Cough-productive-yellow/gray in color-noticed its happening more often than about 6 months ago. SOB with long walks.  Since last here, she had right total hip replacement in February and is doing very well. Variable cough produces thick sputum, mostly white. She denies night sweats or fever. Sneezing this spring controlled with OTC antihistamine. CT chest 07/12/11-  IMPRESSION:  1. Pulmonary parenchymal pattern of diffuse peribronchovascular  nodularity, bronchiectasis and scattered mucoid impaction is  indicative of mycobacterium avium complex (MAC).  2. Right lower lobe nodular density is unchanged and likely within  the spectrum of presumed MAC. If further follow-up is desired, CT  chest without contrast in 12 months is recommended.  3. Dominant right thyroid nodule, as before. Recommend further  evaluation with thyroid ultrasound. If patient is clinically  hyperthyroid, consider nuclear medicine thyroid uptake and scan.  Original Report Authenticated By: [name], M.D.   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 Nodule, thyroid nodule, CAD. Feels as though she is wheezing at times in her thoat area. Hoarseness . [facility] Nov, [facility]. We started triple therapy with clarithromycin 250 mg x2, rifampin 300 mg x2 and Myambutol 400 mg x3, each taken 3 days per week beginning 08/08/2011 so she has not had 2 months of therapy. She complains of throat tickle and some sneeze. She has been coughing for more than 6 months. Uses tramadol once daily with 2 Tylenol. She declines cough syrup. Chronic back pain/physical therapy.  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 Nodule, thyroid nodule, CAD. We started triple therapy with clarithromycin 250 mg x2, rifampin 300 mg x2 and Myambutol 400 mg  x3, each taken 3 days per week beginning 08/08/2011 Increase in SOB and wheezing since last visit. Still with some of the prednisone taper we gave. Now persistent sense of something in her throat and feels wheeze originating in her throat. Had some laryngitis. She does feel reflux despite Pepcid. Annoying watery rhinorrhea COPD assessment test (CAT) score 10/24 CXR- 10/10/11 IMPRESSION:  1. No radiographic evidence of acute cardiopulmonary disease.  Original Report Authenticated By: Florencia ReasonsANIEL W. ENTRIKIN, M.D.   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 Nodule, thyroid nodule, CAD. We started triple therapy with clarithromycin 250 mg x2, rifampin 300 mg x2 and Myambutol 400 mg x3, each taken 3 days per week beginning 08/08/2011 FOLLOWS FOR: no more SOB and wheezing than at last visit; cough-productive Denies night sweats or fever. Sputum is mostly clear. CXR 8/6 / 13- to watch right lower lung zone for possible atelectasis IMPRESSION:  1. No radiographic evidence of acute cardiopulmonary disease.  Original Report Authenticated By: Florencia ReasonsANIEL W. ENTRIKIN, M.D.  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 Nodule, thyroid nodule, CAD. We started triple therapy with clarithromycin 250 mg x2, rifampin 300 mg x2 and Myambutol 400 mg x3, each taken 3 days per week beginning 08/08/2011/ end 03/26/12. FOLLOWS FOR: Has noticed increased SOB with activity Cough is now mostly dry. Once or twice has seen tiny clot from back of throat.  Denies fever, sweat, nodes, chest pain, wheeze, edema.  Sputum Cx 03/17/12  Neg AFB smear and culture                                    Pos- broadly sens pseudomonas CXR 03/12/12- images and report reviewed with her.  IMPRESSION:  1. Lingular subsegmental atelectasis or scar.  2. Thoracic spondylosis and kyphosis.  3. Dextroconvex lumbar scoliosis.  4. Atherosclerosis.   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 Nodule, thyroid nodule, CAD. We started triple therapy with clarithromycin 250 mg x2, rifampin 300 mg x2 and Myambutol 400 mg x3, each taken 3 days per week beginning 08/08/2011/ end 03/26/12 FOLLOWS FOR: non productive cough and continues to notice SOB at times. Productive cough has been dry since Cipro for pseudomonas from 03/2012 sputum Cx. AFB was Neg. Occ slight wheeze. No fever, sweat, nodes or chest pain. CXR 05/19/12  IMPRESSION:  1. No acute cardiopulmonary findings.  2. Degenerative osteophytosis of the thoracic spine.  Original Report Authenticated By: Genevive Bi, M.D.  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 Nodule,  Complicated by thyroid nodule, CAD, CKD. We started triple therapy with clarithromycin 250 mg x2, rifampin 300 mg x2 and Myambutol 400 mg x3, each taken 3 days per week beginning 08/08/2011/ End 03/26/12 FOLLOWS FOR: slight dizzy this morning "dont feel like I'm all here"; continues to have productive cough at times-yellow to gray in color. SOB at times as well. Not physically ill- chronic intermittent couh with occ mucus plug, no fever or blood. Followed by nephrologist for chronic kidney disease- says "worse". Main concern family pressuring her to travel to [place]- doesn't want to go.  CXR 05/12/12 IMPRESSION:  1. No acute cardiopulmonary findings.  2. Degenerative osteophytosis of the thoracic spine.  Original Report Authenticated By: Genevive Bi, M.D.  ROS-See HPI Constitutional:   No-   weight loss, night sweats, fevers, chills, fatigue, lassitude. HEENT:   No-  headaches, difficulty swallowing, tooth/dental problems, sore throat,       No-  sneezing, itching, ear ache, nasal congestion, post nasal drip,  CV:  No-   chest pain,  orthopnea, PND, swelling in lower extremities, anasarca, dizziness, palpitations Resp: +  shortness of breath with exertion, Not or at rest.              + productive cough,  + non-productive cough,  No- coughing up of blood.              little-   change in color of mucus.  No- wheezing.   Skin: No-   rash or lesions. GI:  No-heartburn, indigestion, no-abdominal pain, nausea, vomiting, GU: . MS:  No-   joint pain or swelling.   Neuro-     nothing unusual Psych:  No- change in mood or affect. No depression or anxiety.  No memory loss.  OBJ General- Alert, Oriented, Affect-appropriate, Distress- none acute; petite elderly woman. She appears well. Skin- rash-none,excoriation- none.  Lymphadenopathy- none Head- atraumatic            Eyes- Gross vision intact, PERRLA, conjunctivae clear secretions            Ears- Hearing, canals-normal            Nose- Clear, no-Septal dev, mucus, polyps, erosion, perforation  Throat- Mallampati II , mucosa clear , drainage- none  tonsils- atrophic Neck- flexible , trachea midline, no stridor , thyroid+ nodule R, carotid no bruit Chest - symmetrical excursion , unlabored           Heart/CV- RRR , no murmur , no gallop  , no rub, nl s1 s2                           - JVD- none , edema- none, stasis changes- none, varices- none           Lung- + coarse breath sounds, cough- none , dullness-none, rub- none           Chest wall- kyphosis Abd-  Br/ Gen/ Rectal- Not done, not indicated Extrem- cyanosis- none, clubbing, none, atrophy- none, strength- nl Neuro- grossly intact to observation

## 2012-11-16 NOTE — Assessment & Plan Note (Signed)
No clinical progression. We considered trial of maintenance zithromax, but decided to watch her.

## 2012-11-16 NOTE — Assessment & Plan Note (Signed)
No evident recurrence. 

## 2012-11-16 NOTE — Patient Instructions (Addendum)
Flu vax  Please call if needed 

## 2012-11-19 ENCOUNTER — Telehealth: Payer: Self-pay | Admitting: Internal Medicine

## 2012-11-19 NOTE — Telephone Encounter (Signed)
I spoke with pt and she stated she has diarrhea today and felt little weak. She stated it started today. She reports she had the flu shot yesterday and wants to know if that caused her symptoms. I advised her there was a stomach bug going around. She stated if she is not better by tomorrow she will let us know. Pt denies any fever, chills, sweats, nausea, vomiting. Will forward to Dr. Maple Hudson as an Lorain Childes

## 2012-11-20 NOTE — Telephone Encounter (Signed)
I do not expect diarrhea as common result of flu shot. More likely this is the GI virus going through town as you told her. First aid is kaopectate or pepto bismol

## 2013-03-05 ENCOUNTER — Telehealth: Payer: Self-pay | Admitting: Internal Medicine

## 2013-03-05 NOTE — Telephone Encounter (Signed)
Try otc Delsym for now since tessalon is on back order

## 2013-03-05 NOTE — Telephone Encounter (Signed)
[facility] She states that tessalon pearles (both strengths) are are backorder until March 2015  Please advise if there is an alternative you wish to call in  Thanks Allergies  Allergen Reactions  . Avelox [Moxifloxacin Hcl In Nacl] Other (See Comments)    Flushes face  . Nsaids     Does not take due to kidney disease   Current Outpatient Prescriptions on File Prior to Visit  Medication Sig Dispense Refill  . acetaminophen (TYLENOL) 500 MG tablet Take 1,000 mg by mouth every 8 (eight) hours as needed. Take with tramadol      . albuterol (PROVENTIL HFA;VENTOLIN HFA) 108 (90 BASE) MCG/ACT inhaler Inhale 2 puffs into the lungs every 6 (six) hours as needed for wheezing.  3 Inhaler  1  . amLODipine (NORVASC) 5 MG tablet Take 1 tablet by mouth daily.      . benzonatate (TESSALON) 100 MG capsule Take 1 capsule (100 mg total) by mouth every 6 (six) hours as needed for cough.  30 capsule  1  . calcium carbonate (OS-CAL) 600 MG TABS Take 600 mg by mouth daily.      . famotidine (PEPCID) 40 MG tablet Take 1 tablet by mouth daily.      Marland Kitchen. ipratropium (ATROVENT) 0.06 % nasal spray Place 2 sprays into the nose 4 (four) times daily. If needed  15 mL  12  . Iron-DSS-B12-FA-C-E-Cu-Biotin (HEMAX) 150-1 MG TABS Take 1 tablet by mouth daily.      Marland Kitchen. levothyroxine (SYNTHROID, LEVOTHROID) 50 MCG tablet Take 50 mcg by mouth daily before breakfast.      . metoprolol (TOPROL-XL) 50 MG 24 hr tablet Take 50 mg by mouth 2 (two) times daily.       . Multiple Vitamin (MULTIVITAMIN) tablet Take 1 tablet by mouth daily.        Marland Kitchen. omeprazole (PRILOSEC) 20 MG capsule Take 1 capsule (20 mg total) by mouth daily.  90 capsule  prn  . tiotropium (SPIRIVA) 18 MCG inhalation capsule Place 1 capsule (18 mcg total) into inhaler and inhale daily.  90 capsule  3  . TraMADol HCl (ULTRAM PO) Take by mouth as needed. Patient unsure of dosage       No current facility-administered medications on file prior to visit.

## 2013-03-05 NOTE — Telephone Encounter (Signed)
LMTCB

## 2013-03-08 NOTE — Telephone Encounter (Signed)
Pt is aware to take Delsym per CY.

## 2013-03-18 ENCOUNTER — Ambulatory Visit (INDEPENDENT_AMBULATORY_CARE_PROVIDER_SITE_OTHER): Payer: Medicare Other | Admitting: Internal Medicine

## 2013-03-18 ENCOUNTER — Encounter: Payer: Self-pay | Admitting: Internal Medicine

## 2013-03-18 VITALS — BP 122/70 | HR 70 | Ht 60.0 in | Wt 113.4 lb

## 2013-03-18 DIAGNOSIS — A310 Pulmonary mycobacterial infection: Secondary | ICD-10-CM

## 2013-03-18 DIAGNOSIS — J449 Chronic obstructive pulmonary disease, unspecified: Secondary | ICD-10-CM

## 2013-03-18 MED ORDER — BENZONATATE 100 MG PO CAPS: 100.0000 mg | ORAL_CAPSULE | Freq: Four times a day (QID) | ORAL | Status: DC | PRN

## 2013-03-18 NOTE — Progress Notes (Signed)
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 [place]one [place] [facility] [place] because of COPD. She had smoked 2 packs per day for 26 years/52 pack years, ending in 1984. [name] assumes the diagnosis is COPD. She had seen Dr.DeCoy/Pulmonary in the past before he left town. He treated her with Spiriva which has helped. Symbicort 160 has not helped. She is aware of progressive dyspnea on exertion over the last several years. Has had episodes of bronchitis. Daily cough brings yellow and sometimes green or brown sputum. She denies blood. She was hospitalized once with pneumonia but not for other lung problems. She has not had home oxygen. Has nebulizer machine at home but is not using it. Went to an urgent care this summer for bronchitis, treated with prednisone and an antibiotic. Cardiac problems limited to occasional rapid heartbeat. Anemia treated with iron. Has had 3 spinal steroid injections and is followed by a spine specialist. 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 Intolerant of aspirin and Avelox. Asks flu shot. Has had pneumococcal vaccine but not sure of the date.  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 She had smoked 2 packs per day for 26 years/52 pack years, ending in 1984. Has had flu and pneumonia vaccines. Since last here she had fallen and hit her head with bruising. Has aseptic necrosis of the right hip and is seeing Dr.Took/ Ortho, but does not want surgery. Occasional cough and persistent throat clearing, some wheeze and a few night sweats. She remembers getting a nebulizer treatment once which gave ability to clear mucus from her airways. She called a television advertisement and got a nebulizer machine and medication from an out of state company. She has used  it only once and got little sputum up with that trial. CT 12/20/10- I reviewed images with her- IMPRESSION:  1. Pulmonary parenchymal pattern of diffuse mild bronchiectasis  and peribronchovascular nodularity is indicative of mycobacterium  avium complex (MAC).  2. Vague area of irregular nodularity in the right lower lobe  (image 32). While this is likely within the spectrum of presumed  MAC, a pulmonary nodule cannot be definitively excluded. If the  patient is at high risk for bronchogenic carcinoma, follow-up chest  CT at 3-6 months is recommended. If the patient is at low risk for  bronchogenic carcinoma, follow-up chest CT at 6-12 months is  recommended. This recommendation follows the consensus statement:  Guidelines for Management of Small Pulmonary Nodules Detected on CT  Scans: A Statement from the Fleischner Society as published in  Radiology 2005; 237:395-400. Online at:  DietDisorder.czhttp://www.med.[employer].edu/rad/res/Fleischner-nodule.htm.  3. Dominant right thyroid nodule with associated calcifications.  Thyroid ultrasound is recommended in further evaluation, as  clinically indicated.  4. Coronary artery calcification.  Original  Report Authenticated By: Reyes IvanMELINDA A. BLIETZ, M.D.   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 MAIC, ? Nodule, thyroid nodule, CAD. C/O sob with exertion, occasional wheezing, and feels like there is a band around her chest Had right hip replacement which relieved her pain. Chest feels okay now. Not coughing. Does bring up a little clear mucus occasionally, no blood. Denies fever or sweat. She is aware she has a chronic right thyroid nodule. CT scan of chest from 12/20/2010 was reviewed last visit. Sputum from 02/01/2011 was  [facility]. We have discussed indications for treatment and the option of continued observation instead of antibacterial treatment for now.  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 Nodule, thyroid nodule, CAD. Cough-productive-yellow/gray in color-noticed its happening more often than about 6 months ago. SOB with long walks.  Since last here, she had right total hip replacement in February and is doing very well. Variable cough produces thick sputum, mostly white. She denies night sweats or fever. Sneezing this spring controlled with OTC antihistamine. CT chest 07/12/11-  IMPRESSION:  1. Pulmonary parenchymal pattern of diffuse peribronchovascular  nodularity, bronchiectasis and scattered mucoid impaction is  indicative of mycobacterium avium complex (MAC).  2. Right lower lobe nodular density is unchanged and likely within  the spectrum of presumed MAC. If further follow-up is desired, CT  chest without contrast in 12 months is recommended.  3. Dominant right thyroid nodule, as before. Recommend further  evaluation with thyroid ultrasound. If patient is clinically  hyperthyroid, consider nuclear medicine thyroid uptake and scan.  Original Report Authenticated By: Reyes IvanMELINDA A. BLIETZ, M.D.   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 Nodule, thyroid nodule, CAD. Feels as though she is wheezing at times in her thoat area. Hoarseness . [facility] Nov, [facility]. We started triple therapy with clarithromycin 250 mg x2, rifampin 300 mg x2 and Myambutol 400 mg x3, each taken 3 days per week beginning 08/08/2011 so she has not had 2 months of therapy. She complains of throat tickle and some sneeze. She has been coughing for more than 6 months. Uses tramadol once daily with 2 Tylenol. She declines cough syrup. Chronic back pain/physical therapy.  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 Nodule, thyroid nodule, CAD. We started triple therapy with clarithromycin 250 mg x2, rifampin 300 mg x2 and Myambutol 400 mg  x3, each taken 3 days per week beginning 08/08/2011 Increase in SOB and wheezing since last visit. Still with some of the prednisone taper we gave. Now persistent sense of something in her throat and feels wheeze originating in her throat. Had some laryngitis. She does feel reflux despite Pepcid. Annoying watery rhinorrhea COPD assessment test (CAT) score 10/24 CXR- 10/10/11 IMPRESSION:  1. No radiographic evidence of acute cardiopulmonary disease.  Original Report Authenticated By: Florencia ReasonsANIEL W. ENTRIKIN, M.D.   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 Nodule, thyroid nodule, CAD. We started triple therapy with clarithromycin 250 mg x2, rifampin 300 mg x2 and Myambutol 400 mg x3, each taken 3 days per week beginning 08/08/2011 FOLLOWS FOR: no more SOB and wheezing than at last visit; cough-productive Denies night sweats or fever. Sputum is mostly clear. CXR 8/6 / 13- to watch right lower lung zone for possible atelectasis IMPRESSION:  1. No radiographic evidence of acute cardiopulmonary disease.  Original Report Authenticated By: Florencia ReasonsANIEL W. ENTRIKIN, M.D.  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 Nodule, thyroid nodule, CAD. We started triple therapy with clarithromycin 250 mg x2, rifampin 300 mg x2 and Myambutol 400 mg x3, each taken 3 days per week beginning 08/08/2011/ end 03/26/12. FOLLOWS FOR: Has noticed increased SOB with activity Cough is now mostly dry. Once or twice has seen tiny clot from back of throat.  Denies fever, sweat, nodes, chest pain, wheeze, edema.  Sputum Cx 03/17/12  Neg AFB smear and culture                                    Pos- broadly sens pseudomonas CXR 03/12/12- images and report reviewed with her.  IMPRESSION:  1. Lingular subsegmental atelectasis or scar.  2. Thoracic spondylosis and kyphosis.  3. Dextroconvex lumbar scoliosis.  4. Atherosclerosis.   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 Nodule, thyroid nodule, CAD. We started triple therapy with clarithromycin 250 mg x2, rifampin 300 mg x2 and Myambutol 400 mg x3, each taken 3 days per week beginning 08/08/2011/ end 03/26/12 FOLLOWS FOR: non productive cough and continues to notice SOB at times. Productive cough has been dry since Cipro for pseudomonas from 03/2012 sputum Cx. AFB was Neg. Occ slight wheeze. No fever, sweat, nodes or chest pain. CXR 05/19/12  IMPRESSION:  1. No acute cardiopulmonary findings.  2. Degenerative osteophytosis of the thoracic spine.  Original Report Authenticated By: Genevive BiStewart Edmunds, M.D.  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 Nodule,  Complicated by thyroid nodule, CAD, CKD. We started triple therapy with clarithromycin 250 mg x2, rifampin 300 mg x2 and Myambutol 400 mg x3, each taken 3 days per week beginning 08/08/2011/ End 03/26/12 FOLLOWS FOR: slight dizzy this morning "dont feel like I'm all here"; continues to have productive cough at times-yellow to gray in color. SOB at times as well. Not physically ill- chronic intermittent couh with occ mucus plug, no fever or blood. Followed by nephrologist for chronic kidney disease- says "worse". Main concern [place]- [place]'t want to go.  CXR 05/12/12 IMPRESSION:  1. No acute cardiopulmonary findings.  2. Degenerative osteophytosis of the thoracic spine.  Original Report Authenticated By: Genevive BiStewart Edmunds, M.D.  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 Nodule,  Complicated by thyroid nodule, CAD, CKD. We started triple therapy with clarithromycin 250 mg x2, rifampin 300 mg x2 and Myambutol 400 mg x3, each taken 3 days per week beginning  08/08/2011/ End 03/26/12 Follows For: SOB on exertion - Prod cough ( occas yellow) - Wheezing Aware of some dyspnea on exertion without change, and tolerable Occasional scant wheeze or cough. No fever or sweat. Asks refill benzonatate.  ROS-See HPI Constitutional:   No-   weight loss, night sweats, fevers, chills, fatigue, lassitude. HEENT:   No-  headaches, difficulty swallowing, tooth/dental problems, sore throat,       No-  sneezing, itching, ear ache, nasal congestion, post nasal drip,  CV:  No-   chest pain, orthopnea, PND, swelling in lower extremities, anasarca, dizziness, palpitations Resp: +  shortness of breath with exertion, Not or at rest.              No- productive cough,  + non-productive cough,  No- coughing up of blood.              little-   change in color of mucus.  No- wheezing.   Skin: No-   rash or lesions. GI:  No-heartburn, indigestion, no-abdominal pain, nausea, vomiting, GU: . MS:  No-   joint pain or swelling.   Neuro-     nothing unusual Psych:  No- change in mood or affect. No depression or anxiety.  No  memory loss.  OBJ General- Alert, Oriented, Affect-appropriate, Distress- none acute; petite elderly woman. She                 appears well. Skin- rash-none,excoriation- none.  Lymphadenopathy- none Head- atraumatic            Eyes- Gross vision intact, PERRLA, conjunctivae clear secretions            Ears- Hearing, canals-normal            Nose- Clear, no-Septal dev, mucus, polyps, erosion, perforation             Throat- Mallampati II , mucosa clear , drainage- none  tonsils- atrophic Neck- flexible , trachea midline, no stridor , thyroid+ nodule R, carotid no bruit Chest - symmetrical excursion , unlabored           Heart/CV- RRR , no murmur , no gallop  , no rub, nl s1 s2                           - JVD- none , edema- none, stasis changes- none, varices- none           Lung- + few crackles, cough- none , dullness-none, rub- none           Chest wall-  kyphosis Abd-  Br/ Gen/ Rectal- Not done, not indicated Extrem- cyanosis- none, clubbing, none, atrophy- none, strength- nl Neuro- grossly intact to observation

## 2013-03-18 NOTE — Patient Instructions (Signed)
Script to refill Tesalon (benzonatate) perles  Please call as needed

## 2013-04-13 NOTE — Assessment & Plan Note (Signed)
Symptoms do not suggest recurrence

## 2013-04-13 NOTE — Assessment & Plan Note (Signed)
Plan-refill benzonatate

## 2013-06-17 ENCOUNTER — Ambulatory Visit: Payer: Medicare Other | Admitting: Internal Medicine

## 2013-07-20 ENCOUNTER — Telehealth: Payer: Self-pay | Admitting: Internal Medicine

## 2013-07-20 NOTE — Telephone Encounter (Signed)
lmomtcb x1 for pt 

## 2013-07-21 MED ORDER — IPRATROPIUM BROMIDE 0.06 % NA SOLN: 2.0000 | Freq: Four times a day (QID) | NASAL | Status: DC

## 2013-07-21 NOTE — Telephone Encounter (Signed)
Refill sent and pt is aware. Jennifer Castillo, CMA  

## 2013-08-03 DIAGNOSIS — H40009 Preglaucoma, unspecified, unspecified eye: Secondary | ICD-10-CM | POA: Insufficient documentation

## 2013-08-03 DIAGNOSIS — H353190 Nonexudative age-related macular degeneration, unspecified eye, stage unspecified: Secondary | ICD-10-CM | POA: Insufficient documentation

## 2013-08-06 ENCOUNTER — Telehealth: Payer: Self-pay | Admitting: Internal Medicine

## 2013-08-06 NOTE — Telephone Encounter (Signed)
lmomtcb x1 

## 2013-08-09 NOTE — Telephone Encounter (Signed)
Called spoke with patient who reported that she went to an UC on Sunday 6/7 and was given Azithromycin x10days - was told her symptoms appear viral.  She is feeling better today and declined appt this week ([facility] has an opening on 6/10 @ 0900).  Advised pt to please call the office if she does not continue to improve or her symptoms worsen.  Nothing further needed at this time; will sign off.

## 2013-08-18 ENCOUNTER — Telehealth: Payer: Self-pay | Admitting: Internal Medicine

## 2013-08-18 NOTE — Telephone Encounter (Signed)
Called spoke with patient who reported that she finished her 10-day course of Azithromycin from UC on 6/14 or 6/15 but she is still experiencing prod cough with "dirty yellow" mucus.  Pt stated she does feel better with the abx course and denies any increased SOB, wheezing, tightness, f/c/s, hemoptysis, PND, leg swelling.  She is taking Tessalon for the cough without much relief.  Patient is asking for appt with [facility] this week since the cough is persistent.  Dr Maple HudsonYoung is not in the office this afternoon.  Katie please advise on a work-in appt for patient.  She is okay with a call back tomorrow (has a dentist appt tomorrow afternoon 6/18) and is aware to call the office for sooner follow up if symptoms worsen before hearing back from the office.

## 2013-08-19 NOTE — Telephone Encounter (Signed)
Katie is working on this 

## 2013-08-19 NOTE — Telephone Encounter (Signed)
Pt took 9:15 on Friday pt aware/cb

## 2013-08-19 NOTE — Telephone Encounter (Signed)
Pt can come in on Friday at 9:15am ,9:45am, or 2:45pm slot. I have left message for pt to call back and speak with Triage to make OV.

## 2013-08-20 ENCOUNTER — Ambulatory Visit (INDEPENDENT_AMBULATORY_CARE_PROVIDER_SITE_OTHER)
Admission: RE | Admit: 2013-08-20 | Discharge: 2013-08-20 | Disposition: A | Discharge status: Home / Self Care | Payer: Medicare Other | Source: Ambulatory Visit | Attending: Internal Medicine | Admitting: Internal Medicine

## 2013-08-20 ENCOUNTER — Ambulatory Visit (INDEPENDENT_AMBULATORY_CARE_PROVIDER_SITE_OTHER): Payer: Medicare Other | Admitting: Internal Medicine

## 2013-08-20 ENCOUNTER — Encounter: Payer: Self-pay | Admitting: Internal Medicine

## 2013-08-20 VITALS — BP 140/88 | HR 80 | Ht 60.0 in | Wt 108.6 lb

## 2013-08-20 DIAGNOSIS — J449 Chronic obstructive pulmonary disease, unspecified: Secondary | ICD-10-CM

## 2013-08-20 DIAGNOSIS — A310 Pulmonary mycobacterial infection: Secondary | ICD-10-CM

## 2013-08-20 MED ORDER — METHYLPREDNISOLONE ACETATE 80 MG/ML IJ SUSP
80.0000 mg | Freq: Once | INTRAMUSCULAR | Status: AC
  Administered 2013-08-20: 80 mg via INTRAMUSCULAR

## 2013-08-20 MED ORDER — CLARITHROMYCIN 500 MG PO TABS: ORAL_TABLET | ORAL | Status: DC

## 2013-08-20 MED ORDER — LEVALBUTEROL HCL 0.63 MG/3ML IN NEBU
0.6300 mg | INHALATION_SOLUTION | Freq: Once | RESPIRATORY_TRACT | Status: AC
  Administered 2013-08-20: 0.63 mg via RESPIRATORY_TRACT

## 2013-08-20 NOTE — Progress Notes (Signed)
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 [place]anone [place] [facility] because of COPD. She had smoked 2 packs per day for 26 years/52 pack years, ending in 1984. [name] assumes the diagnosis is COPD. She had seen Dr.DeCoy/Pulmonary in the past before he left town. He treated her with Spiriva which has helped. Symbicort 160 has not helped. She is aware of progressive dyspnea on exertion over the last several years. Has had episodes of bronchitis. Daily cough brings yellow and sometimes green or brown sputum. She denies blood. She was hospitalized once with pneumonia but not for other lung problems. She has not had home oxygen. Has nebulizer machine at home but is not using it. Went to an urgent care this summer for bronchitis, treated with prednisone and an antibiotic. Cardiac problems limited to occasional rapid heartbeat. Anemia treated with iron. Has had 3 spinal steroid injections and is followed by a spine specialist. 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 Intolerant of aspirin and Avelox. Asks flu shot. Has had pneumococcal vaccine but not sure of the date.  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 She had smoked 2 packs per day for 26 years/52 pack years, ending in 1984. Has had flu and pneumonia vaccines. Since last here she had fallen and hit her head with bruising. Has aseptic necrosis of the right hip and is seeing Dr.Took/ Ortho, but does not want surgery. Occasional cough and persistent throat clearing, some wheeze and a few night sweats. She remembers getting a nebulizer treatment once which gave ability to clear mucus from her airways. She called a television advertisement and got a nebulizer machine and medication from an out of state company. She has used  it only once and got little sputum up with that trial. CT 12/20/10- I reviewed images with her- IMPRESSION:  1. Pulmonary parenchymal pattern of diffuse mild bronchiectasis  and peribronchovascular nodularity is indicative of mycobacterium  avium complex (MAC).  2. Vague area of irregular nodularity in the right lower lobe  (image 32). While this is likely within the spectrum of presumed  MAC, a pulmonary nodule cannot be definitively excluded. If the  patient is at high risk for bronchogenic carcinoma, follow-up chest  CT at 3-6 months is recommended. If the patient is at low risk for  bronchogenic carcinoma, follow-up chest CT at 6-12 months is  recommended. This recommendation follows the consensus statement:  Guidelines for Management of Small Pulmonary Nodules Detected on CT  Scans: A Statement from the Fleischner Society as published in  Radiology 2005; 237:395-400. Online at:  DietDisorder.czhttp://www.med.[employer].edu/rad/res/Fleischner-nodule.htm.  3. Dominant right thyroid nodule with associated calcifications.  Thyroid ultrasound is recommended in further evaluation, as  clinically indicated.  4. Coronary artery calcification.  Original  Report Authenticated By: Reyes IvanMELINDA A. BLIETZ, M.D.   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 MAIC, ? Nodule, thyroid nodule, CAD. C/O sob with exertion, occasional wheezing, and feels like there is a band around her chest Had right hip replacement which relieved her pain. Chest feels okay now. Not coughing. Does bring up a little clear mucus occasionally, no blood. Denies fever or sweat. She is aware she has a chronic right thyroid nodule. CT scan of chest from 12/20/2010 was reviewed last visit. Sputum from 02/01/2011 was  [facility]. We have discussed indications for treatment and the option of continued observation instead of antibacterial treatment for now.  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 Nodule, thyroid nodule, CAD. Cough-productive-yellow/gray in color-noticed its happening more often than about 6 months ago. SOB with long walks.  Since last here, she had right total hip replacement in February and is doing very well. Variable cough produces thick sputum, mostly white. She denies night sweats or fever. Sneezing this spring controlled with OTC antihistamine. CT chest 07/12/11-  IMPRESSION:  1. Pulmonary parenchymal pattern of diffuse peribronchovascular  nodularity, bronchiectasis and scattered mucoid impaction is  indicative of mycobacterium avium complex (MAC).  2. Right lower lobe nodular density is unchanged and likely within  the spectrum of presumed MAC. If further follow-up is desired, CT  chest without contrast in 12 months is recommended.  3. Dominant right thyroid nodule, as before. Recommend further  evaluation with thyroid ultrasound. If patient is clinically  hyperthyroid, consider nuclear medicine thyroid uptake and scan.  Original Report Authenticated By: Reyes IvanMELINDA A. BLIETZ, M.D.   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 Nodule, thyroid nodule, CAD. Feels as though she is wheezing at times in her thoat area. Hoarseness . Culture Pos [facility], [facility]. We started triple therapy with clarithromycin 250 mg x2, rifampin 300 mg x2 and Myambutol 400 mg x3, each taken 3 days per week beginning 08/08/2011 so she has not had 2 months of therapy. She complains of throat tickle and some sneeze. She has been coughing for more than 6 months. Uses tramadol once daily with 2 Tylenol. She declines cough syrup. Chronic back pain/physical therapy.  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 Nodule, thyroid nodule, CAD. We started triple therapy with clarithromycin 250 mg x2, rifampin 300 mg x2 and Myambutol 400 mg  x3, each taken 3 days per week beginning 08/08/2011 Increase in SOB and wheezing since last visit. Still with some of the prednisone taper we gave. Now persistent sense of something in her throat and feels wheeze originating in her throat. Had some laryngitis. She does feel reflux despite Pepcid. Annoying watery rhinorrhea COPD assessment test (CAT) score 10/24 CXR- 10/10/11 IMPRESSION:  1. No radiographic evidence of acute cardiopulmonary disease.  Original Report Authenticated By: Florencia ReasonsANIEL W. ENTRIKIN, M.D.   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 Nodule, thyroid nodule, CAD. We started triple therapy with clarithromycin 250 mg x2, rifampin 300 mg x2 and Myambutol 400 mg x3, each taken 3 days per week beginning 08/08/2011 FOLLOWS FOR: no more SOB and wheezing than at last visit; cough-productive Denies night sweats or fever. Sputum is mostly clear. CXR 8/6 / 13- to watch right lower lung zone for possible atelectasis IMPRESSION:  1. No radiographic evidence of acute cardiopulmonary disease.  Original Report Authenticated By: Florencia ReasonsANIEL W. ENTRIKIN, M.D.  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 Nodule, thyroid nodule, CAD. We started triple therapy with clarithromycin 250 mg x2, rifampin 300 mg x2 and Myambutol 400 mg x3, each taken 3 days per week beginning 08/08/2011/ end 03/26/12. FOLLOWS FOR: Has noticed increased SOB with activity Cough is now mostly dry. Once or twice has seen tiny clot from back of throat.  Denies fever, sweat, nodes, chest pain, wheeze, edema.  Sputum Cx 03/17/12  Neg AFB smear and culture                                    Pos- broadly sens pseudomonas CXR 03/12/12- images and report reviewed with her.  IMPRESSION:  1. Lingular subsegmental atelectasis or scar.  2. Thoracic spondylosis and kyphosis.  3. Dextroconvex lumbar scoliosis.  4. Atherosclerosis.   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 Nodule, thyroid nodule, CAD. We started triple therapy with clarithromycin 250 mg x2, rifampin 300 mg x2 and Myambutol 400 mg x3, each taken 3 days per week beginning 08/08/2011/ end 03/26/12 FOLLOWS FOR: non productive cough and continues to notice SOB at times. Productive cough has been dry since Cipro for pseudomonas from 03/2012 sputum Cx. AFB was Neg. Occ slight wheeze. No fever, sweat, nodes or chest pain. CXR 05/19/12  IMPRESSION:  1. No acute cardiopulmonary findings.  2. Degenerative osteophytosis of the thoracic spine.  Original Report Authenticated By: Genevive BiStewart Edmunds, M.D.  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 Nodule,  Complicated by thyroid nodule, CAD, CKD. We started triple therapy with clarithromycin 250 mg x2, rifampin 300 mg x2 and Myambutol 400 mg x3, each taken 3 days per week beginning 08/08/2011/ End 03/26/12 FOLLOWS FOR: slight dizzy this morning "dont feel like I'm all here"; continues to have productive cough at times-yellow to gray in color. SOB at times as well. Not physically ill- chronic intermittent couh with occ mucus plug, no fever or blood. Followed by nephrologist for chronic kidney disease- says "worse". Main concern [place]- [place]'t want to go.  CXR 05/12/12 IMPRESSION:  1. No acute cardiopulmonary findings.  2. Degenerative osteophytosis of the thoracic spine.  Original Report Authenticated By: Genevive BiStewart Edmunds, M.D.  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 Nodule,  Complicated by thyroid nodule, CAD, CKD. We started triple therapy with clarithromycin 250 mg x2, rifampin 300 mg x2 and Myambutol 400 mg x3, each taken 3 days per week beginning  08/08/2011/ End 03/26/12 Follows For: SOB on exertion - Prod cough ( occas yellow) - Wheezing Aware of some dyspnea on exertion without change, and tolerable Occasional scant wheeze or cough. No fever or sweat. Asks refill benzonatate.  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 Nodule,  Complicated by thyroid nodule, CAD, CKD. We started triple therapy with clarithromycin 250 mg x2, rifampin 300 mg x2 and Myambutol 400 mg x3, each taken 3 days per week beginning 08/08/2011/ End 03/26/12 FOLLOWS FOR:  Reports having cough with yellow mucus, sob due to coughing and some wheezing x 3-4 weeks.  Pt has done round of antibiotics from urgent care and symptoms improved and have come back  acute bronchitis pattern can better initially with Z-Pak from urgent care but no cough with white sputum has returned. No fever. Throat feels irritated. Rescue inhaler helps, used a couple of times a day  ROS-See HPI Constitutional:   No-   weight loss, night sweats, fevers, chills, fatigue, lassitude. HEENT:   No-  headaches, difficulty swallowing, tooth/dental problems, sore throat,       No-  sneezing, itching, ear ache, nasal congestion, post nasal drip,  CV:  No-   chest pain,  orthopnea, PND, swelling in lower extremities, anasarca, dizziness, palpitations Resp: +  shortness of breath with exertion, Not or at rest.              +productive cough,  + non-productive cough,  No- coughing up of blood.            No-lchange in color of mucus.  No- wheezing.   Skin: No-   rash or lesions. GI:  No-heartburn, indigestion, no-abdominal pain, nausea, vomiting, GU: . MS:  No-   joint pain or swelling.   Neuro-     nothing unusual Psych:  No- change in mood or affect. No depression or anxiety.  No memory loss.  OBJ General- Alert, Oriented, Affect-appropriate, Distress- none acute; petite elderly woman. She                 appears well. Skin- rash-none,excoriation-  none.  Lymphadenopathy- none Head- atraumatic            Eyes- Gross vision intact, PERRLA, conjunctivae clear secretions            Ears- Hearing, canals-normal            Nose- Clear, no-Septal dev, mucus, polyps, erosion, perforation             Throat- Mallampati II , mucosa clear , drainage- none  tonsils- atrophic Neck- flexible , trachea midline, no stridor , thyroid+ nodule R, carotid no bruit Chest - symmetrical excursion , unlabored           Heart/CV- RRR , no murmur , no gallop  , no rub, nl s1 s2                           - JVD- none , edema- none, stasis changes- none, varices- none           Lung-  Crackles-none, cough+ deep , dullness-none, rub- none, wheeze-none           Chest wall- kyphosis Abd-  Br/ Gen/ Rectal- Not done, not indicated Extrem- cyanosis- none, clubbing, none, atrophy- none, strength- nl Neuro- grossly intact to observation

## 2013-08-20 NOTE — Patient Instructions (Addendum)
Order- CXR   Dx acute exacerbation of chronic bronchitis  Neb xop 0.63  Depo 80  Script for biaxin antibiotic sent- take after meals.   It may help your stomach to also take a probilotic, like Activia yogurt or otc Florastor, while you are on biaxin.

## 2013-08-24 ENCOUNTER — Telehealth: Payer: Self-pay | Admitting: Internal Medicine

## 2013-08-24 NOTE — Telephone Encounter (Signed)
Per OV 08/20/13; Script for biaxin antibiotic sent- take after meals.   It may help your stomach to also take a probilotic, like Activia yogurt or otc Florastor, while you are on biaxin. ---  LMTCB x1

## 2013-08-24 NOTE — Telephone Encounter (Signed)
Sorry the biaxin was uncomfortable. Since she has already spent money on it- one approach would be to split the pills and just take 1/2 tab, after meals twice daily for 4-5 more days. If she doesn't want to do this then we can wait to see how she does off antibiotics.

## 2013-08-24 NOTE — Telephone Encounter (Signed)
I called and made pt aware of recs. She will stay off medication for now. Nothing further needed

## 2013-08-24 NOTE — Telephone Encounter (Signed)
Spoke with the pt  She was unable to tolerate the biaxin  She started med on 6/20 and stopped on 6/22  She states it left a horrible taste in her mouth and also caused diarrhea (despite taking probiotic) She is not coughing near as much as she was when med was prescribed, but still produces minimal yellow sputum  MAR and allergy list updated  Please advise, thanks! Allergies  Allergen Reactions  . Avelox [Moxifloxacin Hcl In Nacl] Other (See Comments)    Flushes face  . Biaxin [Clarithromycin]     Diarrhea and bad taste in mouth  . Nsaids     Does not take due to kidney disease   Current Outpatient Prescriptions on File Prior to Visit  Medication Sig Dispense Refill  . acetaminophen (TYLENOL) 500 MG tablet Take 1,000 mg by mouth every 8 (eight) hours as needed. Take with tramadol      . albuterol (PROVENTIL HFA;VENTOLIN HFA) 108 (90 BASE) MCG/ACT inhaler Inhale 2 puffs into the lungs every 6 (six) hours as needed for wheezing.  3 Inhaler  1  . amLODipine (NORVASC) 5 MG tablet Take 1 tablet by mouth daily.      . benzonatate (TESSALON) 100 MG capsule Take 1 capsule (100 mg total) by mouth every 6 (six) hours as needed for cough.  30 capsule  2  . calcium carbonate (OS-CAL) 600 MG TABS Take 600 mg by mouth daily.      . famotidine (PEPCID) 40 MG tablet Take 1 tablet by mouth daily.      Marland Kitchen. ipratropium (ATROVENT) 0.06 % nasal spray Place 2 sprays into the nose 4 (four) times daily. If needed  15 mL  12  . Iron-DSS-B12-FA-C-E-Cu-Biotin (HEMAX) 150-1 MG TABS Take 1 tablet by mouth daily.      Marland Kitchen. levothyroxine (SYNTHROID, LEVOTHROID) 50 MCG tablet Take 50 mcg by mouth daily before breakfast.      . metoprolol (TOPROL-XL) 50 MG 24 hr tablet Take 50 mg by mouth 2 (two) times daily.       . Multiple Vitamin (MULTIVITAMIN) tablet Take 1 tablet by mouth daily.        Marland Kitchen. tiotropium (SPIRIVA) 18 MCG inhalation capsule Place 1 capsule (18 mcg total) into inhaler and inhale daily.  90 capsule  3  .  TraMADol HCl (ULTRAM PO) Take by mouth as needed. Patient unsure of dosage       No current facility-administered medications on file prior to visit.

## 2013-08-24 NOTE — Telephone Encounter (Signed)
Pt returning call.[name] ° °

## 2013-09-01 HISTORY — PX: EYE SURGERY: SHX253

## 2013-09-02 DIAGNOSIS — Z9849 Cataract extraction status, unspecified eye: Secondary | ICD-10-CM | POA: Insufficient documentation

## 2013-09-11 ENCOUNTER — Other Ambulatory Visit: Payer: Self-pay | Admitting: Internal Medicine

## 2013-09-15 ENCOUNTER — Encounter: Payer: Self-pay | Admitting: Internal Medicine

## 2013-09-15 ENCOUNTER — Ambulatory Visit (INDEPENDENT_AMBULATORY_CARE_PROVIDER_SITE_OTHER): Payer: Medicare Other | Admitting: Internal Medicine

## 2013-09-15 VITALS — BP 122/58 | HR 66 | Ht 60.0 in | Wt 108.0 lb

## 2013-09-15 DIAGNOSIS — J449 Chronic obstructive pulmonary disease, unspecified: Secondary | ICD-10-CM

## 2013-09-15 NOTE — Progress Notes (Signed)
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 [place] [facility] because of COPD. She had smoked 2 packs per day for 26 years/52 pack years, ending in 1984. [name] assumes the diagnosis is COPD. She had seen Dr.DeCoy/Pulmonary in the past before he left town. He treated her with Spiriva which has helped. Symbicort 160 has not helped. She is aware of progressive dyspnea on exertion over the last several years. Has had episodes of bronchitis. Daily cough brings yellow and sometimes green or brown sputum. She denies blood. She was hospitalized once with pneumonia but not for other lung problems. She has not had home oxygen. Has nebulizer machine at home but is not using it. Went to an urgent care this summer for bronchitis, treated with prednisone and an antibiotic. Cardiac problems limited to occasional rapid heartbeat. Anemia treated with iron. Has had 3 spinal steroid injections and is followed by a spine specialist. 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 Intolerant of aspirin and Avelox. Asks flu shot. Has had pneumococcal vaccine but not sure of the date.  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 She had smoked 2 packs per day for 26 years/52 pack years, ending in 1984. Has had flu and pneumonia vaccines. Since last here she had fallen and hit her head with bruising. Has aseptic necrosis of the right hip and is seeing Dr.Took/ Ortho, but does not want surgery. Occasional cough and persistent throat clearing, some wheeze and a few night sweats. She remembers getting a nebulizer treatment once which gave ability to clear mucus from her airways. She called a television advertisement and got a nebulizer machine and medication from an out of state company. She has used  it only once and got little sputum up with that trial. CT 12/20/10- I reviewed images with her- IMPRESSION:  1. Pulmonary parenchymal pattern of diffuse mild bronchiectasis  and peribronchovascular nodularity is indicative of mycobacterium  avium complex (MAC).  2. Vague area of irregular nodularity in the right lower lobe  (image 32). While this is likely within the spectrum of presumed  MAC, a pulmonary nodule cannot be definitively excluded. If the  patient is at high risk for bronchogenic carcinoma, follow-up chest  CT at 3-6 months is recommended. If the patient is at low risk for  bronchogenic carcinoma, follow-up chest CT at 6-12 months is  recommended. This recommendation follows the consensus statement:  Guidelines for Management of Small Pulmonary Nodules Detected on CT  Scans: A Statement from the Fleischner Society as published in  Radiology 2005; 237:395-400. Online at:  DietDisorder.czhttp://www.med.[employer].edu/rad/res/Fleischner-nodule.htm.  3. Dominant right thyroid nodule with associated calcifications.  Thyroid ultrasound is recommended in further evaluation, as  clinically indicated.  4. Coronary artery calcification.  Original  Report Authenticated By: Reyes IvanMELINDA A. BLIETZ, M.D.   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 MAIC, ? Nodule, thyroid nodule, CAD. C/O sob with exertion, occasional wheezing, and feels like there is a band around her chest Had right hip replacement which relieved her pain. Chest feels okay now. Not coughing. Does bring up a little clear mucus occasionally, no blood. Denies fever or sweat. She is aware she has a chronic right thyroid nodule. CT scan of chest from 12/20/2010 was reviewed last visit. Sputum from 02/01/2011 was  [facility]. We have discussed indications for treatment and the option of continued observation instead of antibacterial treatment for now.  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 Nodule, thyroid nodule, CAD. Cough-productive-yellow/gray in color-noticed its happening more often than about 6 months ago. SOB with long walks.  Since last here, she had right total hip replacement in February and is doing very well. Variable cough produces thick sputum, mostly white. She denies night sweats or fever. Sneezing this spring controlled with OTC antihistamine. CT chest 07/12/11-  IMPRESSION:  1. Pulmonary parenchymal pattern of diffuse peribronchovascular  nodularity, bronchiectasis and scattered mucoid impaction is  indicative of mycobacterium avium complex (MAC).  2. Right lower lobe nodular density is unchanged and likely within  the spectrum of presumed MAC. If further follow-up is desired, CT  chest without contrast in 12 months is recommended.  3. Dominant right thyroid nodule, as before. Recommend further  evaluation with thyroid ultrasound. If patient is clinically  hyperthyroid, consider nuclear medicine thyroid uptake and scan.  Original Report Authenticated By: Reyes IvanMELINDA A. BLIETZ, M.D.   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 Nodule, thyroid nodule, CAD. Feels as though she is wheezing at times in her thoat area. Hoarseness . Culture Pos [facility] Nov, 2012. We started triple therapy with clarithromycin 250 mg x2, rifampin 300 mg x2 and Myambutol 400 mg x3, each taken 3 days per week beginning 08/08/2011 so she has not had 2 months of therapy. She complains of throat tickle and some sneeze. She has been coughing for more than 6 months. Uses tramadol once daily with 2 Tylenol. She declines cough syrup. Chronic back pain/physical therapy.  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 Nodule, thyroid nodule, CAD. We started triple therapy with clarithromycin 250 mg x2, rifampin 300 mg x2 and Myambutol 400 mg  x3, each taken 3 days per week beginning 08/08/2011 Increase in SOB and wheezing since last visit. Still with some of the prednisone taper we gave. Now persistent sense of something in her throat and feels wheeze originating in her throat. Had some laryngitis. She does feel reflux despite Pepcid. Annoying watery rhinorrhea COPD assessment test (CAT) score 10/24 CXR- 10/10/11 IMPRESSION:  1. No radiographic evidence of acute cardiopulmonary disease.  Original Report Authenticated By: Florencia ReasonsANIEL W. ENTRIKIN, M.D.   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 Nodule, thyroid nodule, CAD. We started triple therapy with clarithromycin 250 mg x2, rifampin 300 mg x2 and Myambutol 400 mg x3, each taken 3 days per week beginning 08/08/2011 FOLLOWS FOR: no more SOB and wheezing than at last visit; cough-productive Denies night sweats or fever. Sputum is mostly clear. CXR 8/6 / 13- to watch right lower lung zone for possible atelectasis IMPRESSION:  1. No radiographic evidence of acute cardiopulmonary disease.  Original Report Authenticated By: Florencia ReasonsANIEL W. ENTRIKIN, M.D.  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 Nodule, thyroid nodule, CAD. We started triple therapy with clarithromycin 250 mg x2, rifampin 300 mg x2 and Myambutol 400 mg x3, each taken 3 days per week beginning 08/08/2011/ end 03/26/12. FOLLOWS FOR: Has noticed increased SOB with activity Cough is now mostly dry. Once or twice has seen tiny clot from back of throat.  Denies fever, sweat, nodes, chest pain, wheeze, edema.  Sputum Cx 03/17/12  Neg AFB smear and culture                                    Pos- broadly sens pseudomonas CXR 03/12/12- images and report reviewed with her.  IMPRESSION:  1. Lingular subsegmental atelectasis or scar.  2. Thoracic spondylosis and kyphosis.  3. Dextroconvex lumbar scoliosis.  4. Atherosclerosis.   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 Nodule, thyroid nodule, CAD. We started triple therapy with clarithromycin 250 mg x2, rifampin 300 mg x2 and Myambutol 400 mg x3, each taken 3 days per week beginning 08/08/2011/ end 03/26/12 FOLLOWS FOR: non productive cough and continues to notice SOB at times. Productive cough has been dry since Cipro for pseudomonas from 03/2012 sputum Cx. AFB was Neg. Occ slight wheeze. No fever, sweat, nodes or chest pain. CXR 05/19/12  IMPRESSION:  1. No acute cardiopulmonary findings.  2. Degenerative osteophytosis of the thoracic spine.  Original Report Authenticated By: Genevive BiStewart Edmunds, M.D.  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 Nodule,  Complicated by thyroid nodule, CAD, CKD. We started triple therapy with clarithromycin 250 mg x2, rifampin 300 mg x2 and Myambutol 400 mg x3, each taken 3 days per week beginning 08/08/2011/ End 03/26/12 FOLLOWS FOR: slight dizzy this morning "dont feel like I'm all here"; continues to have productive cough at times-yellow to gray in color. SOB at times as well. Not physically ill- chronic intermittent couh with occ mucus plug, no fever or blood. Followed by nephrologist for chronic kidney disease- says "worse". Main concern [place]- [place]'t want to go.  CXR 05/12/12 IMPRESSION:  1. No acute cardiopulmonary findings.  2. Degenerative osteophytosis of the thoracic spine.  Original Report Authenticated By: Genevive BiStewart Edmunds, M.D.  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 Nodule,  Complicated by thyroid nodule, CAD, CKD. We started triple therapy with clarithromycin 250 mg x2, rifampin 300 mg x2 and Myambutol 400 mg x3, each taken 3 days per week beginning  08/08/2011/ End 03/26/12 Follows For: SOB on exertion - Prod cough ( occas yellow) - Wheezing Aware of some dyspnea on exertion without change, and tolerable Occasional scant wheeze or cough. No fever or sweat. Asks refill benzonatate.  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 Nodule,  Complicated by thyroid nodule, CAD, CKD. We started triple therapy with clarithromycin 250 mg x2, rifampin 300 mg x2 and Myambutol 400 mg x3, each taken 3 days per week beginning 08/08/2011/ End 03/26/12 FOLLOWS FOR:  Reports having cough with yellow mucus, sob due to coughing and some wheezing x 3-4 weeks.  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 Nodule,  Complicated by thyroid nodule, CAD, CKD. We started triple therapy with clarithromycin 250 mg x2, rifampin 300 mg x2 and Myambutol 400 mg x3, each taken 3 days per week beginning 08/08/2011/ End 03/26/12 FOLLOWS FOR: feels better since coughing up mucus-yellow in color. Continues to have productive cough. Last sputum cx Jan, 2015 +pseudomonas, Rx'd. Some cough occasionally, not concerned now. Scant phlegm. For last exacerbation we gave Zpak then biaxin-had  to stop after 2 days due to bad taste. Wheeze is gone. No fever/ sweat.  CXR 08/20/13 IMPRESSION:  No acute cardiopulmonary abnormality seen.  Electronically Signed  By: Roque Lias M.D.  On: 08/20/2013 10:07  ROS-See HPI Constitutional:   No-   weight loss, night sweats, fevers, chills, fatigue, lassitude. HEENT:   No-  headaches, difficulty swallowing, tooth/dental problems, sore throat,       No-  sneezing, itching, ear ache, nasal congestion, post nasal drip,  CV:  No-   chest pain, orthopnea, PND, swelling in lower extremities, anasarca, dizziness, palpitations Resp: +  shortness of breath with  exertion, Not or at rest.              No- productive cough,  + non-productive cough,  No- coughing up of blood.              No-  change in color of mucus.  No- wheezing.   Skin:   No- rash or lesions. GI:  No-heartburn, indigestion, no-abdominal pain, nausea, vomiting, GU: . MS:  No-   joint pain or swelling.   Neuro-     nothing unusual Psych:  No- change in mood or affect. No depression or anxiety.  No memory loss.  OBJ General- Alert, Oriented, Affect-appropriate, Distress- none acute; petite elderly woman. She                 appears well. Skin- rash-none,excoriation- none.  Lymphadenopathy- none Head- atraumatic            Eyes- Gross vision intact, PERRLA, conjunctivae clear secretions            Ears- Hearing, canals-normal            Nose- Clear, no-Septal dev, mucus, polyps, erosion, perforation             Throat- Mallampati II , mucosa clear , drainage- none  tonsils- atrophic Neck- flexible , trachea midline, no stridor , thyroid+ nodule R, carotid no bruit Chest - symmetrical excursion , unlabored           Heart/CV- RRR , no murmur , no gallop  , no rub, nl s1 s2                           - JVD- none , edema- none, stasis changes- none, varices- none           Lung- + few crackles, cough- none , dullness-none, rub- none           Chest wall- kyphosis Abd-  Br/ Gen/ Rectal- Not done, not indicated Extrem- cyanosis- none, clubbing, none, atrophy- none, strength- nl Neuro- grossly intact to observation

## 2013-09-15 NOTE — Patient Instructions (Addendum)
Please call as needed  For a mild exacerbation of your bronchitis, try using otc Mucinex. If you are getting worse, let us know.

## 2013-09-15 NOTE — Assessment & Plan Note (Signed)
Chronic bronchitis, now near baseline. No obvious infectious component now.Best to stay off antibiotics. She is ok putting up with minor chronic cough now.

## 2013-10-31 NOTE — Assessment & Plan Note (Signed)
Acute bronchitic exacerbation Plan-nebulizer treatment Xopenex, Depo-Medrol, chest x-ray, Biaxin

## 2013-10-31 NOTE — Assessment & Plan Note (Signed)
No documented recurrence Plan-continue surveillance

## 2013-12-06 ENCOUNTER — Other Ambulatory Visit: Payer: Self-pay | Admitting: Internal Medicine

## 2013-12-23 ENCOUNTER — Emergency Department (HOSPITAL_BASED_OUTPATIENT_CLINIC_OR_DEPARTMENT_OTHER)
Admission: EM | Admit: 2013-12-23 | Discharge: 2013-12-23 | Disposition: A | Discharge status: Home / Self Care | Payer: Medicare Other | Attending: Emergency Medicine | Admitting: Emergency Medicine

## 2013-12-23 ENCOUNTER — Encounter (HOSPITAL_BASED_OUTPATIENT_CLINIC_OR_DEPARTMENT_OTHER): Payer: Self-pay | Admitting: Emergency Medicine

## 2013-12-23 DIAGNOSIS — Z87448 Personal history of other diseases of urinary system: Secondary | ICD-10-CM | POA: Insufficient documentation

## 2013-12-23 DIAGNOSIS — S59902A Unspecified injury of left elbow, initial encounter: Secondary | ICD-10-CM | POA: Diagnosis present

## 2013-12-23 DIAGNOSIS — E039 Hypothyroidism, unspecified: Secondary | ICD-10-CM | POA: Diagnosis not present

## 2013-12-23 DIAGNOSIS — Y9389 Activity, other specified: Secondary | ICD-10-CM | POA: Insufficient documentation

## 2013-12-23 DIAGNOSIS — W01198A Fall on same level from slipping, tripping and stumbling with subsequent striking against other object, initial encounter: Secondary | ICD-10-CM | POA: Insufficient documentation

## 2013-12-23 DIAGNOSIS — Z7982 Long term (current) use of aspirin: Secondary | ICD-10-CM | POA: Diagnosis not present

## 2013-12-23 DIAGNOSIS — S0003XA Contusion of scalp, initial encounter: Secondary | ICD-10-CM | POA: Diagnosis not present

## 2013-12-23 DIAGNOSIS — K219 Gastro-esophageal reflux disease without esophagitis: Secondary | ICD-10-CM | POA: Insufficient documentation

## 2013-12-23 DIAGNOSIS — I10 Essential (primary) hypertension: Secondary | ICD-10-CM | POA: Diagnosis not present

## 2013-12-23 DIAGNOSIS — Y9239 Other specified sports and athletic area as the place of occurrence of the external cause: Secondary | ICD-10-CM | POA: Diagnosis not present

## 2013-12-23 DIAGNOSIS — Z87891 Personal history of nicotine dependence: Secondary | ICD-10-CM | POA: Diagnosis not present

## 2013-12-23 DIAGNOSIS — J449 Chronic obstructive pulmonary disease, unspecified: Secondary | ICD-10-CM | POA: Diagnosis not present

## 2013-12-23 DIAGNOSIS — W19XXXA Unspecified fall, initial encounter: Secondary | ICD-10-CM

## 2013-12-23 DIAGNOSIS — Z79899 Other long term (current) drug therapy: Secondary | ICD-10-CM | POA: Diagnosis not present

## 2013-12-23 DIAGNOSIS — M1990 Unspecified osteoarthritis, unspecified site: Secondary | ICD-10-CM | POA: Insufficient documentation

## 2013-12-23 DIAGNOSIS — S51012A Laceration without foreign body of left elbow, initial encounter: Secondary | ICD-10-CM

## 2013-12-23 MED ORDER — LIDOCAINE HCL (PF) 1 % IJ SOLN
INTRAMUSCULAR | Status: AC
  Filled 2013-12-23: qty 5

## 2013-12-23 NOTE — Discharge Instructions (Signed)
Keep wound clean and dry. Apply bacitracin and a clean dressing twice daily for the next several days.  Sutures are to be removed in 7-10 days. Return to the ER if you develop redness, pus draining from the wound, streaks up and down the arm, or other new and concerning symptoms.   Head Injury You have received a head injury. It does not appear serious at this time. Headaches and vomiting are common following head injury. It should be easy to awaken from sleeping. Sometimes it is necessary for you to stay in the emergency department for a while for observation. Sometimes admission to the hospital may be needed. After injuries such as yours, most problems occur within the first 24 hours, but side effects may occur up to 7-10 days after the injury. It is important for you to carefully monitor your condition and contact your health care provider or seek immediate medical care if there is a change in your condition. WHAT ARE THE TYPES OF HEAD INJURIES? Head injuries can be as minor as a bump. Some head injuries can be more severe. More severe head injuries include:  A jarring injury to the brain (concussion).  A bruise of the brain (contusion). This mean there is bleeding in the brain that can cause swelling.  A cracked skull (skull fracture).  Bleeding in the brain that collects, clots, and forms a bump (hematoma). WHAT CAUSES A HEAD INJURY? A serious head injury is most likely to happen to someone who is in a car wreck and is not wearing a seat belt. Other causes of major head injuries include bicycle or motorcycle accidents, sports injuries, and falls. HOW ARE HEAD INJURIES DIAGNOSED? A complete history of the event leading to the injury and your current symptoms will be helpful in diagnosing head injuries. Many times, pictures of the brain, such as CT or MRI are needed to see the extent of the injury. Often, an overnight hospital stay is necessary for observation.  WHEN SHOULD I SEEK IMMEDIATE  MEDICAL CARE?  You should get help right away if:  You have confusion or drowsiness.  You feel sick to your stomach (nauseous) or have continued, forceful vomiting.  You have dizziness or unsteadiness that is getting worse.  You have severe, continued headaches not relieved by medicine. Only take over-the-counter or prescription medicines for pain, fever, or discomfort as directed by your health care provider.  You do not have normal function of the arms or legs or are unable to walk.  You notice changes in the black spots in the center of the colored part of your eye (pupil).  You have a clear or bloody fluid coming from your nose or ears.  You have a loss of vision. During the next 24 hours after the injury, you must stay with someone who can watch you for the warning signs. This person should contact local emergency services (911 in the [place] if you have seizures, you become unconscious, or you are unable to wake up. HOW CAN I PREVENT A HEAD INJURY IN THE FUTURE? The most important factor for preventing major head injuries is avoiding motor vehicle accidents. To minimize the potential for damage to your head, it is crucial to wear seat belts while riding in motor vehicles. Wearing helmets while bike riding and playing collision sports (like football) is also helpful. Also, avoiding dangerous activities around the house will further help reduce your risk of head injury.  WHEN CAN I RETURN TO NORMAL ACTIVITIES AND ATHLETICS?  You should be reevaluated by your health care provider before returning to these activities. If you have any of the following symptoms, you should not return to activities or contact sports until 1 week after the symptoms have stopped:  Persistent headache.  Dizziness or vertigo.  Poor attention and concentration.  Confusion.  Memory problems.  Nausea or vomiting.  Fatigue or tire easily.  Irritability.  Intolerant of bright lights or loud  noises.  Anxiety or depression.  Disturbed sleep. MAKE SURE YOU:   Understand these instructions.  Will watch your condition.  Will get help right away if you are not doing well or get worse. Document Released: 02/18/2005 Document Revised: 02/23/2013 Document Reviewed: 10/26/2012 [facility] Patient Information 2015 [place], [place]. This information is not intended to replace advice given to you by your health care provider. Make sure you discuss any questions you have with your health care provider.  Laceration Care, Adult A laceration is a cut or lesion that goes through all layers of the skin and into the tissue just beneath the skin. TREATMENT  Some lacerations may not require closure. Some lacerations may not be able to be closed due to an increased risk of infection. It is important to see your caregiver as soon as possible after an injury to minimize the risk of infection and maximize the opportunity for successful closure. If closure is appropriate, pain medicines may be given, if needed. The wound will be cleaned to help prevent infection. Your caregiver will use stitches (sutures), staples, wound glue (adhesive), or skin adhesive strips to repair the laceration. These tools bring the skin edges together to allow for faster healing and a better cosmetic outcome. However, all wounds will heal with a scar. Once the wound has healed, scarring can be minimized by covering the wound with sunscreen during the day for 1 full year. HOME CARE INSTRUCTIONS  For sutures or staples:  Keep the wound clean and dry.  If you were given a bandage (dressing), you should change it at least once a day. Also, change the dressing if it becomes wet or dirty, or as directed by your caregiver.  Wash the wound with soap and water 2 times a day. Rinse the wound off with water to remove all soap. Pat the wound dry with a clean towel.  After cleaning, apply a thin layer of the antibiotic ointment as  recommended by your caregiver. This will help prevent infection and keep the dressing from sticking.  You may shower as usual after the first 24 hours. Do not soak the wound in water until the sutures are removed.  Only take over-the-counter or prescription medicines for pain, discomfort, or fever as directed by your caregiver.  Get your sutures or staples removed as directed by your caregiver. For skin adhesive strips:  Keep the wound clean and dry.  Do not get the skin adhesive strips wet. You may bathe carefully, using caution to keep the wound dry.  If the wound gets wet, pat it dry with a clean towel.  Skin adhesive strips will fall off on their own. You may trim the strips as the wound heals. Do not remove skin adhesive strips that are still stuck to the wound. They will fall off in time. For wound adhesive:  You may briefly wet your wound in the shower or bath. Do not soak or scrub the wound. Do not swim. Avoid periods of heavy perspiration until the skin adhesive has fallen off on its own. After showering  or bathing, gently pat the wound dry with a clean towel.  Do not apply liquid medicine, cream medicine, or ointment medicine to your wound while the skin adhesive is in place. This may loosen the film before your wound is healed.  If a dressing is placed over the wound, be careful not to apply tape directly over the skin adhesive. This may cause the adhesive to be pulled off before the wound is healed.  Avoid prolonged exposure to sunlight or tanning lamps while the skin adhesive is in place. Exposure to ultraviolet light in the first year will darken the scar.  The skin adhesive will usually remain in place for 5 to 10 days, then naturally fall off the skin. Do not pick at the adhesive film. You may need a tetanus shot if:  You cannot remember when you had your last tetanus shot.  You have never had a tetanus shot. If you get a tetanus shot, your arm may swell, get red, and  feel warm to the touch. This is common and not a problem. If you need a tetanus shot and you choose not to have one, there is a rare chance of getting tetanus. Sickness from tetanus can be serious. SEEK MEDICAL CARE IF:   You have redness, swelling, or increasing pain in the wound.  You see a red line that goes away from the wound.  You have yellowish-white fluid (pus) coming from the wound.  You have a fever.  You notice a bad smell coming from the wound or dressing.  Your wound breaks open before or after sutures have been removed.  You notice something coming out of the wound such as wood or glass.  Your wound is on your hand or foot and you cannot move a finger or toe. SEEK IMMEDIATE MEDICAL CARE IF:   Your pain is not controlled with prescribed medicine.  You have severe swelling around the wound causing pain and numbness or a change in color in your arm, hand, leg, or foot.  Your wound splits open and starts bleeding.  You have worsening numbness, weakness, or loss of function of any joint around or beyond the wound.  You develop painful lumps near the wound or on the skin anywhere on your body. MAKE SURE YOU:   Understand these instructions.  Will watch your condition.  Will get help right away if you are not doing well or get worse. Document Released: 02/18/2005 Document Revised: 05/13/2011 Document Reviewed: 08/14/2010 [facility] Patient Information 2015 [place], [place]. This information is not intended to replace advice given to you by your health care provider. Make sure you discuss any questions you have with your health care provider.

## 2013-12-23 NOTE — ED Provider Notes (Signed)
CSN: [id number]     Arrival date & time 12/23/13  1227 History   First MD Initiated Contact with Patient 12/23/13 1249     Chief Complaint  Patient presents with  . Fall     (Consider location/radiation/quality/duration/timing/severity/associated sxs/prior Treatment) HPI Comments: Patient is an 80 year old female with history of hypertension, COPD. She presents after a slip and fall in the shower at the gym she attends. She states that she landed on her elbow causing a laceration. She then fell back and struck her head on the tile floor. She denies any loss of consciousness and denies any headache, but does have a "lump" on the back of her head. She denies any neck pain.  Patient is a 80 y.o. female presenting with fall. The history is provided by the patient.  Fall This is a new problem. The current episode started 1 to 2 hours ago. The problem occurs constantly. The problem has not changed since onset.Pertinent negatives include no headaches and no shortness of breath. Nothing aggravates the symptoms. Nothing relieves the symptoms. She has tried nothing for the symptoms.    Past Medical History  Diagnosis Date  . Hypertension   . Abnormal heart rhythm   . COPD (chronic obstructive pulmonary disease)   . Kidney disease   . Shortness of breath   . Hypothyroidism   . GERD (gastroesophageal reflux disease)   . Arthritis   . Weight loss     10-12 lbs in 2 months (03/2011)   Past Surgical History  Procedure Laterality Date  . Tonsillectomy    . Thyroidectomy      Left  . Esophagogastroduodenoscopy    . Colonoscopy    . Anterior and posterior vaginal repair    . Cardiac catheterization      6 years ago  . Total hip arthroplasty  04/24/2011    Procedure: TOTAL HIP ARTHROPLASTY;  Surgeon: Nestor LewandowskyFrank J Rowan, MD;  Location: [facility] OR;  Service: Orthopedics;  Laterality: Right;  DEPUY/PINNACLE CUP, SUMMIT BASIC STEM   Family History  Problem Relation Age of Onset  . Emphysema Sister      smoker  . Allergies Brother   . Heart disease Brother   . Rheum arthritis Daughter   . Cancer Mother     GI  . Cancer Brother     throat  . Cancer Sister     unsure   History  Substance Use Topics  . Smoking status: Former Smoker -- 2.00 packs/day for 26 years    Types: Cigarettes    Quit date: 03/04/1982  . Smokeless tobacco: Never Used  . Alcohol Use: 0.6 oz/week    1 Shots of liquor per week   OB History   Grav Para Term Preterm Abortions TAB SAB Ect Mult Living                 Review of Systems  Respiratory: Negative for shortness of breath.   Neurological: Negative for headaches.  All other systems reviewed and are negative.     Allergies  Avelox; Biaxin; and Nsaids  Home Medications   Prior to Admission medications   Medication Sig Start Date End Date Taking? Authorizing Provider  acetaminophen (TYLENOL) 500 MG tablet Take 1,000 mg by mouth every 8 (eight) hours as needed. Take with tramadol    Historical Provider, MD  albuterol (PROVENTIL HFA;VENTOLIN HFA) 108 (90 BASE) MCG/ACT inhaler Inhale 2 puffs into the lungs every 6 (six) hours as needed for wheezing. 07/20/12  Waymon Budge, MD  amLODipine (NORVASC) 5 MG tablet Take 1 tablet by mouth daily. 10/14/10   Historical Provider, MD  aspirin EC 81 MG tablet Take 1 tablet by mouth daily.    Historical Provider, MD  benzonatate (TESSALON) 100 MG capsule Take 1 capsule (100 mg total) by mouth every 6 (six) hours as needed for cough. 03/18/13   Waymon Budge, MD  calcium carbonate (OS-CAL) 600 MG TABS Take 600 mg by mouth daily.    Historical Provider, MD  DULoxetine (CYMBALTA) 60 MG capsule Take 60 mg by mouth. Three times a week 08/27/13 08/27/14  Historical Provider, MD  famotidine (PEPCID) 40 MG tablet Take 1 tablet by mouth daily. 10/10/10   Historical Provider, MD  ipratropium (ATROVENT) 0.06 % nasal spray Place 2 sprays into the nose 4 (four) times daily. If needed 07/21/13 07/21/14  Waymon Budge, MD   levothyroxine (SYNTHROID, LEVOTHROID) 50 MCG tablet Take 50 mcg by mouth daily before breakfast.    Historical Provider, MD  metoprolol (TOPROL-XL) 50 MG 24 hr tablet Take 50 mg by mouth 2 (two) times daily.     Historical Provider, MD  Multiple Vitamin (MULTIVITAMIN) tablet Take 1 tablet by mouth daily.      Historical Provider, MD  SPIRIVA HANDIHALER 18 MCG inhalation capsule INHALE THE CONTENTS OF 1 CAPSULE ONCE DAILY 12/07/13   Waymon Budge, MD  TraMADol HCl (ULTRAM PO) Take by mouth as needed. Patient unsure of dosage    Historical Provider, MD  VIGAMOX 0.5 % ophthalmic solution  08/30/13   Historical Provider, MD   BP 116/74  Pulse 73  Temp(Src) 98 F (36.7 C) (Oral)  Resp 18  Ht 5' (1.524 m)  Wt 108 lb (48.988 kg)  BMI 21.09 kg/m2  SpO2 99% Physical Exam  Nursing note and vitals reviewed. Constitutional: She is oriented to person, place, and time. She appears well-developed and well-nourished. No distress.  HENT:  Head: Normocephalic and atraumatic.  Eyes: EOM are normal. Pupils are equal, round, and reactive to light.  Neck: Normal range of motion. Neck supple.  There is no cervical spine tenderness to palpation and no step-offs. She has painless range of motion in all directions.  Cardiovascular: Normal rate and regular rhythm.  Exam reveals no gallop and no friction rub.   No murmur heard. Pulmonary/Chest: Effort normal and breath sounds normal. No respiratory distress. She has no wheezes.  Abdominal: Soft. Bowel sounds are normal. She exhibits no distension. There is no tenderness.  Musculoskeletal: Normal range of motion.  The left elbow has a 2.5 cm laceration. She has good range of motion without discomfort.  Neurological: She is alert and oriented to person, place, and time. No cranial nerve deficit. She exhibits normal muscle tone. Coordination normal.  Skin: Skin is warm and dry. She is not diaphoretic.    ED Course  Procedures (including critical care  time) Labs Review Labs Reviewed - No data to display  Imaging Review No results found.   EKG Interpretation None     LACERATION REPAIR Performed by: Geoffery Lyons Authorized by: Geoffery Lyons Consent: Verbal consent obtained. Risks and benefits: risks, benefits and alternatives were discussed Consent given by: patient Patient identity confirmed: provided demographic data Prepped and Draped in normal sterile fashion Wound explored  Laceration Location: Left elbow  Laceration Length: 2.5 cm  No Foreign Bodies seen or palpated  Anesthesia: local infiltration  Local anesthetic: lidocaine 1 % without epinephrine  Anesthetic total: 2 ml  Irrigation method: syringe Amount of cleaning: standard  Skin closure: 4-0 Prolene   Number of sutures: 3   Technique: Simple interrupted   Patient tolerance: Patient tolerated the procedure well with no immediate complications.   MDM   Final diagnoses:  None    Laceration repaired.  There was no loss of consciousness, neurologic exam is nonfocal and the patient is not complaining of headache. She is not on any anticoagulants and does not want to have a CT scan performed. I feel as though this is an appropriate course of action. She understands to return if she develops headache or other symptoms. Sutures are to be removed in 7-10 days.    Geoffery Lyonsouglas Deasia Chiu, MD 12/23/13 [id number]

## 2013-12-23 NOTE — ED Notes (Signed)
She slipped in the shower and fell while at the [employer] this am. Injury to her left elbow. She also hit her head on the shower wall. No LOC.

## 2013-12-28 ENCOUNTER — Ambulatory Visit (INDEPENDENT_AMBULATORY_CARE_PROVIDER_SITE_OTHER): Payer: Medicare Other | Admitting: Internal Medicine

## 2013-12-28 ENCOUNTER — Other Ambulatory Visit: Payer: Medicare Other

## 2013-12-28 ENCOUNTER — Encounter: Payer: Self-pay | Admitting: Internal Medicine

## 2013-12-28 VITALS — BP 116/64 | HR 70 | Ht 60.0 in | Wt 113.0 lb

## 2013-12-28 DIAGNOSIS — A310 Pulmonary mycobacterial infection: Secondary | ICD-10-CM

## 2013-12-28 DIAGNOSIS — J209 Acute bronchitis, unspecified: Secondary | ICD-10-CM

## 2013-12-28 DIAGNOSIS — A319 Mycobacterial infection, unspecified: Secondary | ICD-10-CM

## 2013-12-28 DIAGNOSIS — Z23 Encounter for immunization: Secondary | ICD-10-CM

## 2013-12-28 DIAGNOSIS — J42 Unspecified chronic bronchitis: Secondary | ICD-10-CM

## 2013-12-28 DIAGNOSIS — J449 Chronic obstructive pulmonary disease, unspecified: Secondary | ICD-10-CM

## 2013-12-28 MED ORDER — CIPROFLOXACIN HCL 250 MG PO TABS: 250.0000 mg | ORAL_TABLET | Freq: Two times a day (BID) | ORAL | Status: DC

## 2013-12-28 NOTE — Patient Instructions (Signed)
Flu vax  Order- sputum culture- routine, fungal, AFB    Dx acute exacerbation of Chronic bronchitis  Script sent for Cipro antibiotic

## 2013-12-28 NOTE — Progress Notes (Signed)
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 [facility] because of COPD. She had smoked 2 packs per day for 26 years/52 pack years, ending in 1984. Mrs. Anna GenreConroy assumes the diagnosis is COPD. She had seen Dr.DeCoy/Pulmonary in the past before he left town. He treated her with Spiriva which has helped. Symbicort 160 has not helped. She is aware of progressive dyspnea on exertion over the last several years. Has had episodes of bronchitis. Daily cough brings yellow and sometimes green or brown sputum. She denies blood. She was hospitalized once with pneumonia but not for other lung problems. She has not had home oxygen. Has nebulizer machine at home but is not using it. Went to an urgent care this summer for bronchitis, treated with prednisone and an antibiotic. Cardiac problems limited to occasional rapid heartbeat. Anemia treated with iron. Has had 3 spinal steroid injections and is followed by a spine specialist. 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 Intolerant of aspirin and Avelox. Asks flu shot. Has had pneumococcal vaccine but not sure of the date.  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 She had smoked 2 packs per day for 26 years/52 pack years, ending in 1984. Has had flu and pneumonia vaccines. Since last here she had fallen and hit her head with bruising. Has aseptic necrosis of the right hip and is seeing Dr.Took/ Ortho, but does not want surgery. Occasional cough and persistent throat clearing, some wheeze and a few night sweats. She remembers getting a nebulizer treatment once which gave ability to clear mucus from her airways. She called a television advertisement and got a nebulizer machine and medication from an out of state company. She has used  it only once and got little sputum up with that trial. CT 12/20/10- I reviewed images with her- IMPRESSION:  1. Pulmonary parenchymal pattern of diffuse mild bronchiectasis  and peribronchovascular nodularity is indicative of mycobacterium  avium complex (MAC).  2. Vague area of irregular nodularity in the right lower lobe  (image 32). While this is likely within the spectrum of presumed  MAC, a pulmonary nodule cannot be definitively excluded. If the  patient is at high risk for bronchogenic carcinoma, follow-up chest  CT at 3-6 months is recommended. If the patient is at low risk for  bronchogenic carcinoma, follow-up chest CT at 6-12 months is  recommended. This recommendation follows the consensus statement:  Guidelines for Management of Small Pulmonary Nodules Detected on CT  Scans: A Statement from the Fleischner Society as published in  Radiology 2005; 237:395-400. Online at:  DietDisorder.czhttp://www.med.[employer].edu/rad/res/Fleischner-nodule.htm.  3. Dominant right thyroid nodule with associated calcifications.  Thyroid ultrasound is recommended in further evaluation, as  clinically indicated.  4. Coronary artery calcification.  Original  Report Authenticated By: Reyes IvanMELINDA A. BLIETZ, M.D.   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 MAIC, ? Nodule, thyroid nodule, CAD. C/O sob with exertion, occasional wheezing, and feels like there is a band around her chest Had right hip replacement which relieved her pain. Chest feels okay now. Not coughing. Does bring up a little clear mucus occasionally, no blood. Denies fever or sweat. She is aware she has a chronic right thyroid nodule. CT scan of chest from 12/20/2010 was reviewed last visit. Sputum from 02/01/2011 was  [facility]. We have discussed indications for treatment and the option of continued observation instead of antibacterial treatment for now.  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 Nodule, thyroid nodule, CAD. Cough-productive-yellow/gray in color-noticed its happening more often than about 6 months ago. SOB with long walks.  Since last here, she had right total hip replacement in February and is doing very well. Variable cough produces thick sputum, mostly white. She denies night sweats or fever. Sneezing this spring controlled with OTC antihistamine. CT chest 07/12/11-  IMPRESSION:  1. Pulmonary parenchymal pattern of diffuse peribronchovascular  nodularity, bronchiectasis and scattered mucoid impaction is  indicative of mycobacterium avium complex (MAC).  2. Right lower lobe nodular density is unchanged and likely within  the spectrum of presumed MAC. If further follow-up is desired, CT  chest without contrast in 12 months is recommended.  3. Dominant right thyroid nodule, as before. Recommend further  evaluation with thyroid ultrasound. If patient is clinically  hyperthyroid, consider nuclear medicine thyroid uptake and scan.  Original Report Authenticated By: Reyes IvanMELINDA A. BLIETZ, M.D.   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 Nodule, thyroid nodule, CAD. Feels as though she is wheezing at times in her thoat area. Hoarseness . Culture Pos [facility] Nov, 2012. We started triple therapy with clarithromycin 250 mg x2, rifampin 300 mg x2 and Myambutol 400 mg x3, each taken 3 days per week beginning 08/08/2011 so she has not had 2 months of therapy. She complains of throat tickle and some sneeze. She has been coughing for more than 6 months. Uses tramadol once daily with 2 Tylenol. She declines cough syrup. Chronic back pain/physical therapy.  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 Nodule, thyroid nodule, CAD. We started triple therapy with clarithromycin 250 mg x2, rifampin 300 mg x2 and Myambutol 400 mg  x3, each taken 3 days per week beginning 08/08/2011 Increase in SOB and wheezing since last visit. Still with some of the prednisone taper we gave. Now persistent sense of something in her throat and feels wheeze originating in her throat. Had some laryngitis. She does feel reflux despite Pepcid. Annoying watery rhinorrhea COPD assessment test (CAT) score 10/24 CXR- 10/10/11 IMPRESSION:  1. No radiographic evidence of acute cardiopulmonary disease.  Original Report Authenticated By: Florencia ReasonsANIEL W. ENTRIKIN, M.D.   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 Nodule, thyroid nodule, CAD. We started triple therapy with clarithromycin 250 mg x2, rifampin 300 mg x2 and Myambutol 400 mg x3, each taken 3 days per week beginning 08/08/2011 FOLLOWS FOR: no more SOB and wheezing than at last visit; cough-productive Denies night sweats or fever. Sputum is mostly clear. CXR 8/6 / 13- to watch right lower lung zone for possible atelectasis IMPRESSION:  1. No radiographic evidence of acute cardiopulmonary disease.  Original Report Authenticated By: Florencia ReasonsANIEL W. ENTRIKIN, M.D.  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 Nodule, thyroid nodule, CAD. We started triple therapy with clarithromycin 250 mg x2, rifampin 300 mg x2 and Myambutol 400 mg x3, each taken 3 days per week beginning 08/08/2011/ end 03/26/12. FOLLOWS FOR: Has noticed increased SOB with activity Cough is now mostly dry. Once or twice has seen tiny clot from back of throat.  Denies fever, sweat, nodes, chest pain, wheeze, edema.  Sputum Cx 03/17/12  Neg AFB smear and culture                                    Pos- broadly sens pseudomonas CXR 03/12/12- images and report reviewed with her.  IMPRESSION:  1. Lingular subsegmental atelectasis or scar.  2. Thoracic spondylosis and kyphosis.  3. Dextroconvex lumbar scoliosis.  4. Atherosclerosis.   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 Nodule, thyroid nodule, CAD. We started triple therapy with clarithromycin 250 mg x2, rifampin 300 mg x2 and Myambutol 400 mg x3, each taken 3 days per week beginning 08/08/2011/ end 03/26/12 FOLLOWS FOR: non productive cough and continues to notice SOB at times. Productive cough has been dry since Cipro for pseudomonas from 03/2012 sputum Cx. AFB was Neg. Occ slight wheeze. No fever, sweat, nodes or chest pain. CXR 05/19/12  IMPRESSION:  1. No acute cardiopulmonary findings.  2. Degenerative osteophytosis of the thoracic spine.  Original Report Authenticated By: Genevive BiStewart Edmunds, M.D.  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 Nodule,  Complicated by thyroid nodule, CAD, CKD. We started triple therapy with clarithromycin 250 mg x2, rifampin 300 mg x2 and Myambutol 400 mg x3, each taken 3 days per week beginning 08/08/2011/ End 03/26/12 FOLLOWS FOR: slight dizzy this morning "dont feel like I'm all here"; continues to have productive cough at times-yellow to gray in color. SOB at times as well. Not physically ill- chronic intermittent couh with occ mucus plug, no fever or blood. Followed by nephrologist for chronic kidney disease- says "worse". Main concern [place]- [place]'t want to go.  CXR 05/12/12 IMPRESSION:  1. No acute cardiopulmonary findings.  2. Degenerative osteophytosis of the thoracic spine.  Original Report Authenticated By: Genevive BiStewart Edmunds, M.D.  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 Nodule,  Complicated by thyroid nodule, CAD, CKD. We started triple therapy with clarithromycin 250 mg x2, rifampin 300 mg x2 and Myambutol 400 mg x3, each taken 3 days per week beginning  08/08/2011/ End 03/26/12 Follows For: SOB on exertion - Prod cough ( occas yellow) - Wheezing Aware of some dyspnea on exertion without change, and tolerable Occasional scant wheeze or cough. No fever or sweat. Asks refill benzonatate.  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 Nodule,  Complicated by thyroid nodule, CAD, CKD. We started triple therapy with clarithromycin 250 mg x2, rifampin 300 mg x2 and Myambutol 400 mg x3, each taken 3 days per week beginning 08/08/2011/ End 03/26/12 FOLLOWS FOR:  Reports having cough with yellow mucus, sob due to coughing and some wheezing x 3-4 weeks.  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 Nodule,  Complicated by thyroid nodule, CAD, CKD. We started triple therapy with clarithromycin 250 mg x2, rifampin 300 mg x2 and Myambutol 400 mg x3, each taken 3 days per week beginning 08/08/2011/ End 03/26/12 FOLLOWS FOR: feels better since coughing up mucus-yellow in color. Continues to have productive cough. Last sputum cx Jan, 2015 +pseudomonas, Rx'd. Some cough occasionally, not concerned now. Scant phlegm. For last exacerbation we gave Zpak then biaxin-had  to stop after 2 days due to bad taste. Wheeze is gone. No fever/ sweat.  CXR 08/20/13 IMPRESSION:  No acute cardiopulmonary abnormality seen.  Electronically Signed  By: Roque LiasJames Green M.D.  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 Nodule,  Complicated by thyroid nodule, CAD, CKD. We started triple therapy with clarithromycin 250 mg x2, rifampin 300 mg x2 and Myambutol 400 mg x3, each taken 3 days per week beginning 08/08/2011/ End 03/26/12 FOLLOWS FOR: pt c/o sob with exertion, prod cough with green/yellow mucus.  Pt brought  sputum sample in case CY wants to test it- collected 1 week ago.  No fever, sweat, blood, nodes or chest pain.  ROS-See HPI Constitutional:   No-   weight loss, night sweats, fevers, chills, fatigue, lassitude. HEENT:   No-  headaches, difficulty swallowing, tooth/dental problems, sore throat,       No-  sneezing, itching, ear ache, nasal congestion, post nasal drip,  CV:  No-   chest pain, orthopnea, PND, swelling in lower extremities, anasarca, dizziness, palpitations Resp: +  shortness of breath with exertion, Not or at rest.              +productive cough,  + non-productive cough,  No- coughing up of blood.             +change in color of mucus.  No- wheezing.   Skin:   No- rash or lesions. GI:  No-heartburn, indigestion, no-abdominal pain, nausea, vomiting, GU: . MS:  No-   joint pain or swelling.   Neuro-     nothing unusual Psych:  No- change in mood or affect. No depression or anxiety.  No memory loss.  OBJ General- Alert, Oriented, Affect-appropriate, Distress- none acute; petite elderly woman. She                 appears well. Skin- rash-none,excoriation- none.  Lymphadenopathy- none Head- atraumatic            Eyes- Gross vision intact, PERRLA, conjunctivae clear secretions            Ears- +hard of hearingl            Nose- Clear, no-Septal dev, mucus, polyps, erosion, perforation             Throat- Mallampati II , mucosa clear , drainage- none  tonsils- atrophic Neck- flexible , trachea midline, no stridor , thyroid+ nodule R, carotid no bruit Chest - symmetrical excursion , unlabored           Heart/CV- RRR , no murmur , no gallop  , no rub, nl s1 s2                           - JVD- none , edema- none, stasis changes- none, varices- none           Lung- + few crackles left back, cough- none , dullness-none, rub- none           Chest wall- kyphosis Abd-  Br/ Gen/ Rectal- Not done, not indicated Extrem- cyanosis- none, clubbing, none, atrophy- none, strength-  nl Neuro- grossly intact to observation

## 2013-12-29 NOTE — Assessment & Plan Note (Signed)
Exacerbation of bronchitis component. We discussed antibiotics, reaction to avelox, cultured pseudomonas-agreed to try cipro if tolerated Plan- Sputum cultures, Cipro, flu vax

## 2013-12-29 NOTE — Assessment & Plan Note (Signed)
Watching for recurrence 

## 2013-12-31 LAB — RESPIRATORY CULTURE OR RESPIRATORY AND SPUTUM CULTURE: Gram Stain: NONE SEEN

## 2014-02-01 DIAGNOSIS — IMO0001 Reserved for inherently not codable concepts without codable children: Secondary | ICD-10-CM | POA: Insufficient documentation

## 2014-02-17 ENCOUNTER — Other Ambulatory Visit: Payer: Self-pay | Admitting: Orthopedic Surgery

## 2014-03-03 ENCOUNTER — Other Ambulatory Visit: Payer: Self-pay | Admitting: Orthopedic Surgery

## 2014-03-03 DIAGNOSIS — M25552 Pain in left hip: Secondary | ICD-10-CM

## 2014-03-05 ENCOUNTER — Ambulatory Visit
Admission: RE | Admit: 2014-03-05 | Discharge: 2014-03-05 | Disposition: A | Discharge status: Home / Self Care | Payer: Medicare Other | Source: Ambulatory Visit | Attending: Orthopedic Surgery | Admitting: Orthopedic Surgery

## 2014-03-05 DIAGNOSIS — M25552 Pain in left hip: Secondary | ICD-10-CM

## 2014-03-26 ENCOUNTER — Encounter (HOSPITAL_BASED_OUTPATIENT_CLINIC_OR_DEPARTMENT_OTHER): Payer: Self-pay

## 2014-03-26 ENCOUNTER — Emergency Department (HOSPITAL_BASED_OUTPATIENT_CLINIC_OR_DEPARTMENT_OTHER)
Admission: EM | Admit: 2014-03-26 | Discharge: 2014-03-26 | Disposition: A | Discharge status: Home / Self Care | Payer: Medicare Other | Attending: Emergency Medicine | Admitting: Emergency Medicine

## 2014-03-26 DIAGNOSIS — Z792 Long term (current) use of antibiotics: Secondary | ICD-10-CM | POA: Diagnosis not present

## 2014-03-26 DIAGNOSIS — K219 Gastro-esophageal reflux disease without esophagitis: Secondary | ICD-10-CM | POA: Insufficient documentation

## 2014-03-26 DIAGNOSIS — J449 Chronic obstructive pulmonary disease, unspecified: Secondary | ICD-10-CM | POA: Insufficient documentation

## 2014-03-26 DIAGNOSIS — G8921 Chronic pain due to trauma: Secondary | ICD-10-CM | POA: Diagnosis not present

## 2014-03-26 DIAGNOSIS — Z7982 Long term (current) use of aspirin: Secondary | ICD-10-CM | POA: Insufficient documentation

## 2014-03-26 DIAGNOSIS — E039 Hypothyroidism, unspecified: Secondary | ICD-10-CM | POA: Insufficient documentation

## 2014-03-26 DIAGNOSIS — I10 Essential (primary) hypertension: Secondary | ICD-10-CM | POA: Diagnosis not present

## 2014-03-26 DIAGNOSIS — Z9889 Other specified postprocedural states: Secondary | ICD-10-CM | POA: Diagnosis not present

## 2014-03-26 DIAGNOSIS — G8929 Other chronic pain: Secondary | ICD-10-CM

## 2014-03-26 DIAGNOSIS — M1990 Unspecified osteoarthritis, unspecified site: Secondary | ICD-10-CM | POA: Insufficient documentation

## 2014-03-26 DIAGNOSIS — M25562 Pain in left knee: Secondary | ICD-10-CM | POA: Diagnosis present

## 2014-03-26 DIAGNOSIS — Z79899 Other long term (current) drug therapy: Secondary | ICD-10-CM | POA: Insufficient documentation

## 2014-03-26 DIAGNOSIS — Z87448 Personal history of other diseases of urinary system: Secondary | ICD-10-CM | POA: Diagnosis not present

## 2014-03-26 DIAGNOSIS — Z87891 Personal history of nicotine dependence: Secondary | ICD-10-CM | POA: Insufficient documentation

## 2014-03-26 NOTE — ED Notes (Signed)
C/o left knee after falling in shower in October.  Has had knee xray in past and had MRI last week of hip.  [facility] and MD will not refill it now d/t pt was given a 1 month supply. States pain shoots up and down left knee. Pt refuses to get undressed. No obvious injury now. Pos pedal pulses.

## 2014-03-26 NOTE — ED Provider Notes (Signed)
CSN: [id number]     Arrival date & time 03/26/14  1121 History   First MD Initiated Contact with Patient 03/26/14 1142     Chief Complaint  Patient presents with  . Knee Pain     (Consider location/radiation/quality/duration/timing/severity/associated sxs/prior Treatment) HPI 81 year old female comes in today complaining of left knee pain. She has had pain in her left hip and knee since a fall in October. She has been seen multiple times for this. She recently had an MRI ordered by Dr. Turner Danielsowan. She reports that she was placed on medicine for the pain afterwards. She reports she had some type of muscle relaxant and that it helped however she lost the prescription. She is currently taken Tylenol and but comes in today stating that the pain is worse in her left knee. She really reports no new injury to this area, no swelling, no redness. I have reviewed her MRI which does show that she has had a contusion to the left femoral head with effusion and severe DJD. She states that she went to physical therapy 1 time and at that they pulled on her leg hurting her and she has not returned. She is using a cane to ambulate. She is living alone and is driving on her own. She drove here today in the snow. He reports that she [place] a son nearby and [place]. She denies any back pain, numbness, tingling, or weakness. He points to the lateral aspect of the left knee and states that it is a sharp pain occasionally. Past Medical History  Diagnosis Date  . Hypertension   . Abnormal heart rhythm   . COPD (chronic obstructive pulmonary disease)   . Kidney disease   . Shortness of breath   . Hypothyroidism   . GERD (gastroesophageal reflux disease)   . Arthritis   . Weight loss     10-12 lbs in 2 months (03/2011)   Past Surgical History  Procedure Laterality Date  . Tonsillectomy    . Thyroidectomy      Left  . Esophagogastroduodenoscopy    . Colonoscopy    . Anterior and posterior vaginal repair    . Cardiac  catheterization      6 years ago  . Total hip arthroplasty  04/24/2011    Procedure: TOTAL HIP ARTHROPLASTY;  Surgeon: Nestor LewandowskyFrank J Rowan, MD;  Location: [facility] OR;  Service: Orthopedics;  Laterality: Right;  DEPUY/PINNACLE CUP, SUMMIT BASIC STEM   Family History  Problem Relation Age of Onset  . Emphysema Sister     smoker  . Allergies Brother   . Heart disease Brother   . Rheum arthritis Daughter   . Cancer Mother     GI  . Cancer Brother     throat  . Cancer Sister     unsure   History  Substance Use Topics  . Smoking status: Former Smoker -- 2.00 packs/day for 26 years    Types: Cigarettes    Quit date: 03/04/1982  . Smokeless tobacco: Never Used  . Alcohol Use: 0.6 oz/week    1 Shots of liquor per week   OB History    No data available     Review of Systems  All other systems reviewed and are negative.     Allergies  Avelox; Biaxin; and Nsaids  Home Medications   Prior to Admission medications   Medication Sig Start Date End Date Taking? Authorizing Provider  acetaminophen (TYLENOL) 500 MG tablet Take 1,000 mg by mouth every  8 (eight) hours as needed. Take with tramadol   Yes Historical Provider, MD  albuterol (PROVENTIL HFA;VENTOLIN HFA) 108 (90 BASE) MCG/ACT inhaler Inhale 2 puffs into the lungs every 6 (six) hours as needed for wheezing. 07/20/12  Yes Waymon Budge, MD  amLODipine (NORVASC) 5 MG tablet Take 1 tablet by mouth daily. 10/14/10  Yes Historical Provider, MD  aspirin EC 81 MG tablet Take 1 tablet by mouth daily.   Yes Historical Provider, MD  calcium carbonate (OS-CAL) 600 MG TABS Take 600 mg by mouth daily.   Yes Historical Provider, MD  famotidine (PEPCID) 40 MG tablet Take 1 tablet by mouth daily. 10/10/10  Yes Historical Provider, MD  levothyroxine (SYNTHROID, LEVOTHROID) 50 MCG tablet Take 50 mcg by mouth daily before breakfast.   Yes Historical Provider, MD  metoprolol (TOPROL-XL) 50 MG 24 hr tablet Take 50 mg by mouth 2 (two) times daily.    Yes  Historical Provider, MD  Multiple Vitamin (MULTIVITAMIN) tablet Take 1 tablet by mouth daily.     Yes Historical Provider, MD  SPIRIVA HANDIHALER 18 MCG inhalation capsule INHALE THE CONTENTS OF 1 CAPSULE ONCE DAILY 12/07/13  Yes Waymon Budge, MD  TraMADol HCl (ULTRAM PO) Take by mouth as needed. Patient unsure of dosage   Yes Historical Provider, MD  benzonatate (TESSALON) 100 MG capsule Take 1 capsule (100 mg total) by mouth every 6 (six) hours as needed for cough. 03/18/13   Waymon Budge, MD  ciprofloxacin (CIPRO) 250 MG tablet Take 1 tablet (250 mg total) by mouth 2 (two) times daily. 12/28/13   Waymon Budge, MD  DULoxetine (CYMBALTA) 60 MG capsule Take 60 mg by mouth. Three times a week 08/27/13 08/27/14  Historical Provider, MD  ipratropium (ATROVENT) 0.06 % nasal spray Place 2 sprays into the nose 4 (four) times daily. If needed 07/21/13 07/21/14  Waymon Budge, MD   BP 104/74 mmHg  Pulse 90  Temp(Src) 97.8 F (36.6 C) (Oral)  Resp 18  Wt 113 lb (51.256 kg)  SpO2 99% Physical Exam  Constitutional: She appears well-developed and well-nourished.  HENT:  Head: Normocephalic and atraumatic.  Eyes: Pupils are equal, round, and reactive to light.  Neck: Normal range of motion.  Cardiovascular: Normal rate.   Abdominal: Soft.  Musculoskeletal:  Left knee left knee without any signs of external trauma. No significant tenderness to palpation throughout the knee. No effusion is noted. Full active range of motion of the knee with no ligamentous laxity noted. Left ankle appears normal on my exam with no tenderness to palpation. Left hip shows no external signs of trauma and she has full active range of motion of this area with no tenderness palpation laterally over the hip or medially in the groin. Left lower extremity reveals no signs of swelling, redness, edema. Pulses are palpable and normal. Sensation is intact.  Nursing note and vitals reviewed.   ED Course  Procedures  (including critical care time) Labs Review Labs Reviewed - No data to display  Imaging Review No results found.   EKG Interpretation None      MDM   Final diagnoses:  Knee pain, chronic, left    81 year old female with left knee pain. She is currently on acetaminophen and tramadol. She is using a cane. She has had a full workup with MRI of her hip by Dr. Turner Daniels. I have advised her that she should continue to use heat and cold and continue with her cane use. She  is a reported allergy to nonsteroidals. I would be very hesitant to add any additional education which could cause sedation as she does live alone and is walking with a cane. She also drove here. She is advised to follow-up with her providers.    Hilario Quarry, MD 03/28/14 [phone]

## 2014-03-26 NOTE — Discharge Instructions (Signed)
Please call your orthopedic doctor on Monday for follow-up. Continue to use your cane for any walking. You can use heat and cold to help the pain in the knee and the hip. Continue to use Tylenol and your tramadol as needed.  Chronic Pain Chronic pain can be defined as pain that is off and on and lasts for 3-6 months or longer. Many things cause chronic pain, which can make it difficult to make a diagnosis. There are many treatment options available for chronic pain. However, finding a treatment that works well for you may require trying various approaches until the right one is found. Many people benefit from a combination of two or more types of treatment to control their pain. SYMPTOMS  Chronic pain can occur anywhere in the body and can range from mild to very severe. Some types of chronic pain include:  Headache.  Low back pain.  Cancer pain.  Arthritis pain.  Neurogenic pain. This is pain resulting from damage to nerves. People with chronic pain may also have other symptoms such as:  Depression.  Anger.  Insomnia.  Anxiety. DIAGNOSIS  Your health care provider will help diagnose your condition over time. In many cases, the initial focus will be on excluding possible conditions that could be causing the pain. Depending on your symptoms, your health care provider may order tests to diagnose your condition. Some of these tests may include:   Blood tests.   CT scan.   MRI.   X-rays.   Ultrasounds.   Nerve conduction studies.  You may need to see a specialist.  TREATMENT  Finding treatment that works well may take time. You may be referred to a pain specialist. He or she may prescribe medicine or therapies, such as:   Mindful meditation or yoga.  Shots (injections) of numbing or pain-relieving medicines into the spine or area of pain.  Local electrical stimulation.  Acupuncture.   Massage therapy.   Aroma, color, light, or sound therapy.   Biofeedback.    Working with a physical therapist to keep from getting stiff.   Regular, gentle exercise.   Cognitive or behavioral therapy.   Group support.  Sometimes, surgery may be recommended.  HOME CARE INSTRUCTIONS   Take all medicines as directed by your health care provider.   Lessen stress in your life by relaxing and doing things such as listening to calming music.   Exercise or be active as directed by your health care provider.   Eat a healthy diet and include things such as vegetables, fruits, fish, and lean meats in your diet.   Keep all follow-up appointments with your health care provider.   Attend a support group with others suffering from chronic pain. SEEK MEDICAL CARE IF:   Your pain gets worse.   You develop a new pain that was not there before.   You cannot tolerate medicines given to you by your health care provider.   You have new symptoms since your last visit with your health care provider.  SEEK IMMEDIATE MEDICAL CARE IF:   You feel weak.   You have decreased sensation or numbness.   You lose control of bowel or bladder function.   Your pain suddenly gets much worse.   You develop shaking.  You develop chills.  You develop confusion.  You develop chest pain.  You develop shortness of breath.  MAKE SURE YOU:  Understand these instructions.  Will watch your condition.  Will get help right away if you  are not doing well or get worse. Document Released: 11/10/2001 Document Revised: 10/21/2012 Document Reviewed: 08/14/2012 [facility] Patient Information 2015 [place], [place]. This information is not intended to replace advice given to you by your health care provider. Make sure you discuss any questions you have with your health care provider.

## 2014-03-31 ENCOUNTER — Ambulatory Visit (INDEPENDENT_AMBULATORY_CARE_PROVIDER_SITE_OTHER): Payer: Medicare Other | Admitting: Internal Medicine

## 2014-03-31 ENCOUNTER — Telehealth: Payer: Self-pay | Admitting: Internal Medicine

## 2014-03-31 ENCOUNTER — Other Ambulatory Visit: Payer: Self-pay | Admitting: Orthopedic Surgery

## 2014-03-31 ENCOUNTER — Encounter: Payer: Self-pay | Admitting: Internal Medicine

## 2014-03-31 ENCOUNTER — Ambulatory Visit (INDEPENDENT_AMBULATORY_CARE_PROVIDER_SITE_OTHER)
Admission: RE | Admit: 2014-03-31 | Discharge: 2014-03-31 | Disposition: A | Discharge status: Home / Self Care | Payer: Medicare Other | Source: Ambulatory Visit | Attending: Internal Medicine | Admitting: Internal Medicine

## 2014-03-31 VITALS — BP 120/76 | HR 89 | Ht 60.0 in | Wt 110.8 lb

## 2014-03-31 DIAGNOSIS — A319 Mycobacterial infection, unspecified: Secondary | ICD-10-CM

## 2014-03-31 DIAGNOSIS — A310 Pulmonary mycobacterial infection: Secondary | ICD-10-CM

## 2014-03-31 DIAGNOSIS — R9389 Abnormal findings on diagnostic imaging of other specified body structures: Secondary | ICD-10-CM

## 2014-03-31 DIAGNOSIS — J449 Chronic obstructive pulmonary disease, unspecified: Secondary | ICD-10-CM

## 2014-03-31 DIAGNOSIS — J479 Bronchiectasis, uncomplicated: Secondary | ICD-10-CM

## 2014-03-31 DIAGNOSIS — Z23 Encounter for immunization: Secondary | ICD-10-CM

## 2014-03-31 NOTE — Telephone Encounter (Signed)
Spoke with Cassandra w/[facility]  Delivering call reports on pt CXR done today 03/31/14 Please advise Dr Maple HudsonYoung. Thanks.

## 2014-03-31 NOTE — Progress Notes (Signed)
12/17/10- 81 year old female former smoker, retired Charity fundraiserN, referred courtesy of [place] [place]one [place] [facility] because of COPD. She had smoked 2 packs per day for 26 years/52 pack years, ending in 1984. [name] assumes the diagnosis is COPD. She had seen Dr.DeCoy/Pulmonary in the past before he left town. He treated her with Spiriva which has helped. Symbicort 160 has not helped. She is aware of progressive dyspnea on exertion over the last several years. Has had episodes of bronchitis. Daily cough brings yellow and sometimes green or brown sputum. She denies blood. She was hospitalized once with pneumonia but not for other lung problems. She has not had home oxygen. Has nebulizer machine at home but is not using it. Went to an urgent care this summer for bronchitis, treated with prednisone and an antibiotic. Cardiac problems limited to occasional rapid heartbeat. Anemia treated with iron. Has had 3 spinal steroid injections and is followed by a spine specialist. 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 Intolerant of aspirin and Avelox. Asks flu shot. Has had pneumococcal vaccine but not sure of the date.  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 She had smoked 2 packs per day for 26 years/52 pack years, ending in 1984. Has had flu and pneumonia vaccines. Since last here she had fallen and hit her head with bruising. Has aseptic necrosis of the right hip and is seeing Dr.Took/ Ortho, but does not want surgery. Occasional cough and persistent throat clearing, some wheeze and a few night sweats. She remembers getting a nebulizer treatment once which gave ability to clear mucus from her airways. She called a television advertisement and got a nebulizer machine and medication from an out of state company. She has used  it only once and got little sputum up with that trial. CT 12/20/10- I reviewed images with her- IMPRESSION:  1. Pulmonary parenchymal pattern of diffuse mild bronchiectasis  and peribronchovascular nodularity is indicative of mycobacterium  avium complex (MAC).  2. Vague area of irregular nodularity in the right lower lobe  (image 32). While this is likely within the spectrum of presumed  MAC, a pulmonary nodule cannot be definitively excluded. If the  patient is at high risk for bronchogenic carcinoma, follow-up chest  CT at 3-6 months is recommended. If the patient is at low risk for  bronchogenic carcinoma, follow-up chest CT at 6-12 months is  recommended. This recommendation follows the consensus statement:  Guidelines for Management of Small Pulmonary Nodules Detected on CT  Scans: A Statement from the Fleischner Society as published in  Radiology 2005; 237:395-400. Online at:  DietDisorder.czhttp://www.med.[employer].edu/rad/res/Fleischner-nodule.htm.  3. Dominant right thyroid nodule with associated calcifications.  Thyroid ultrasound is recommended in further evaluation, as  clinically indicated.  4. Coronary artery calcification.  Original  Report Authenticated By: Reyes IvanMELINDA A. BLIETZ, M.D.   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 MAIC, ? Nodule, thyroid nodule, CAD. C/O sob with exertion, occasional wheezing, and feels like there is a band around her chest Had right hip replacement which relieved her pain. Chest feels okay now. Not coughing. Does bring up a little clear mucus occasionally, no blood. Denies fever or sweat. She is aware she has a chronic right thyroid nodule. CT scan of chest from 12/20/2010 was reviewed last visit. Sputum from 02/01/2011 was  positive [facility]. We have discussed indications for treatment and the option of continued observation instead of antibacterial treatment for now.  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 Nodule, thyroid nodule, CAD. Cough-productive-yellow/gray in color-noticed its happening more often than about 6 months ago. SOB with long walks.  Since last here, she had right total hip replacement in February and is doing very well. Variable cough produces thick sputum, mostly white. She denies night sweats or fever. Sneezing this spring controlled with OTC antihistamine. CT chest 07/12/11-  IMPRESSION:  1. Pulmonary parenchymal pattern of diffuse peribronchovascular  nodularity, bronchiectasis and scattered mucoid impaction is  indicative of mycobacterium avium complex (MAC).  2. Right lower lobe nodular density is unchanged and likely within  the spectrum of presumed MAC. If further follow-up is desired, CT  chest without contrast in 12 months is recommended.  3. Dominant right thyroid nodule, as before. Recommend further  evaluation with thyroid ultrasound. If patient is clinically  hyperthyroid, consider nuclear medicine thyroid uptake and scan.  Original Report Authenticated By: Reyes IvanMELINDA A. BLIETZ, M.D.   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 Nodule, thyroid nodule, CAD. Feels as though she is wheezing at times in her thoat area. Hoarseness . Culture Pos [facility] Nov, 2012. We started triple therapy with clarithromycin 250 mg x2, rifampin 300 mg x2 and Myambutol 400 mg x3, each taken 3 days per week beginning 08/08/2011 so she has not had 2 months of therapy. She complains of throat tickle and some sneeze. She has been coughing for more than 6 months. Uses tramadol once daily with 2 Tylenol. She declines cough syrup. Chronic back pain/physical therapy.  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 Nodule, thyroid nodule, CAD. We started triple therapy with clarithromycin 250 mg x2, rifampin 300 mg x2 and Myambutol 400 mg  x3, each taken 3 days per week beginning 08/08/2011 Increase in SOB and wheezing since last visit. Still with some of the prednisone taper we gave. Now persistent sense of something in her throat and feels wheeze originating in her throat. Had some laryngitis. She does feel reflux despite Pepcid. Annoying watery rhinorrhea COPD assessment test (CAT) score 10/24 CXR- 10/10/11 IMPRESSION:  1. No radiographic evidence of acute cardiopulmonary disease.  Original Report Authenticated By: Florencia ReasonsANIEL W. ENTRIKIN, M.D.   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 Nodule, thyroid nodule, CAD. We started triple therapy with clarithromycin 250 mg x2, rifampin 300 mg x2 and Myambutol 400 mg x3, each taken 3 days per week beginning 08/08/2011 FOLLOWS FOR: no more SOB and wheezing than at last visit; cough-productive Denies night sweats or fever. Sputum is mostly clear. CXR 8/6 / 13- to watch right lower lung zone for possible atelectasis IMPRESSION:  1. No radiographic evidence of acute cardiopulmonary disease.  Original Report Authenticated By: Florencia ReasonsANIEL W. ENTRIKIN, M.D.  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 Nodule, thyroid nodule, CAD. We started triple therapy with clarithromycin 250 mg x2, rifampin 300 mg x2 and Myambutol 400 mg x3, each taken 3 days per week beginning 08/08/2011/ end 03/26/12. FOLLOWS FOR: Has noticed increased SOB with activity Cough is now mostly dry. Once or twice has seen tiny clot from back of throat.  Denies fever, sweat, nodes, chest pain, wheeze, edema.  Sputum Cx 03/17/12  Neg AFB smear and culture                                    Pos- broadly sens pseudomonas CXR 03/12/12- images and report reviewed with her.  IMPRESSION:  1. Lingular subsegmental atelectasis or scar.  2. Thoracic spondylosis and kyphosis.  3. Dextroconvex lumbar scoliosis.  4. Atherosclerosis.   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 Nodule, thyroid nodule, CAD. We started triple therapy with clarithromycin 250 mg x2, rifampin 300 mg x2 and Myambutol 400 mg x3, each taken 3 days per week beginning 08/08/2011/ end 03/26/12 FOLLOWS FOR: non productive cough and continues to notice SOB at times. Productive cough has been dry since Cipro for pseudomonas from 03/2012 sputum Cx. AFB was Neg. Occ slight wheeze. No fever, sweat, nodes or chest pain. CXR 05/19/12  IMPRESSION:  1. No acute cardiopulmonary findings.  2. Degenerative osteophytosis of the thoracic spine.  Original Report Authenticated By: Genevive BiStewart Edmunds, M.D.  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 Nodule,  Complicated by thyroid nodule, CAD, CKD. We started triple therapy with clarithromycin 250 mg x2, rifampin 300 mg x2 and Myambutol 400 mg x3, each taken 3 days per week beginning 08/08/2011/ End 03/26/12 FOLLOWS FOR: slight dizzy this morning "dont feel like I'm all here"; continues to have productive cough at times-yellow to gray in color. SOB at times as well. Not physically ill- chronic intermittent couh with occ mucus plug, no fever or blood. Followed by nephrologist for chronic kidney disease- says "worse". Main concern [place] press[place] to [place]- doesn't want to go.  CXR 05/12/12 IMPRESSION:  1. No acute cardiopulmonary findings.  2. Degenerative osteophytosis of the thoracic spine.  Original Report Authenticated By: Genevive BiStewart Edmunds, M.D.  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 Nodule,  Complicated by thyroid nodule, CAD, CKD. We started triple therapy with clarithromycin 250 mg x2, rifampin 300 mg x2 and Myambutol 400 mg x3, each taken 3 days per week beginning  08/08/2011/ End 03/26/12 Follows For: SOB on exertion - Prod cough ( occas yellow) - Wheezing Aware of some dyspnea on exertion without change, and tolerable Occasional scant wheeze or cough. No fever or sweat. Asks refill benzonatate.  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 Nodule,  Complicated by thyroid nodule, CAD, CKD. We started triple therapy with clarithromycin 250 mg x2, rifampin 300 mg x2 and Myambutol 400 mg x3, each taken 3 days per week beginning 08/08/2011/ End 03/26/12 FOLLOWS FOR:  Reports having cough with yellow mucus, sob due to coughing and some wheezing x 3-4 weeks.  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 Nodule,  Complicated by thyroid nodule, CAD, CKD. We started triple therapy with clarithromycin 250 mg x2, rifampin 300 mg x2 and Myambutol 400 mg x3, each taken 3 days per week beginning 08/08/2011/ End 03/26/12 FOLLOWS FOR: feels better since coughing up mucus-yellow in color. Continues to have productive cough. Last sputum cx Jan, 2015 +pseudomonas, Rx'd. Some cough occasionally, not concerned now. Scant phlegm. For last exacerbation we gave Zpak then biaxin-had  to stop after 2 days due to bad taste. Wheeze is gone. No fever/ sweat.  CXR 08/20/13 IMPRESSION:  No acute cardiopulmonary abnormality seen.  Electronically Signed  By: Roque Lias M.D.  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 Nodule,  Complicated by thyroid nodule, CAD, CKD. We started triple therapy with clarithromycin 250 mg x2, rifampin 300 mg x2 and Myambutol 400 mg x3, each taken 3 days per week beginning 08/08/2011/ End 03/26/12 FOLLOWS FOR: pt c/o sob with exertion, prod cough with green/yellow mucus.  Pt brought  sputum sample in case CY wants to test it- collected 1 week ago.  No fever, sweat, blood, nodes or chest pain.  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 Nodule,  Complicated by thyroid nodule, CAD, CKD. We started triple therapy with clarithromycin 250 mg x2, rifampin 300 mg x2 and Myambutol 400 mg x3, each taken 3 days per week beginning 08/08/2011/ End 03/26/12 FOLLOWS FOR: Pt states she has "warm" feeling inside as though she could use some"coldness inside". Pt felt tightness in chest-unable to catch her breath last night; would like to know if she needs O2 at home or what to do; unsure if any of this is related to anxiety.  She is anxious about upcoming hip replacement surgery. Hip pain is keeping her awake some at night. Little cough and no fever, sweats, adenopathy or purulent sputum.  ROS-See HPI Constitutional:   No-   weight loss, night sweats, fevers, chills, fatigue, lassitude. HEENT:   No-  headaches, difficulty swallowing, tooth/dental problems, sore throat,       No-  sneezing, itching, ear ache, nasal congestion, post nasal drip,  CV:  No-   chest pain, orthopnea, PND, swelling in lower extremities, anasarca, dizziness, palpitations Resp: +  shortness of breath with exertion, Not or at rest.              No-productive cough,  + non-productive cough,  No- coughing up of blood.             No-change in color of mucus.  No- wheezing.   Skin:   No- rash or lesions. GI:  No-heartburn, indigestion, no-abdominal pain, nausea, vomiting, GU: . MS:  No-   joint pain or swelling.   Neuro-     nothing unusual Psych:  No- change in mood or affect. No depression or anxiety.  No memory loss.  OBJ General- Alert, Oriented, Affect-appropriate, Distress- none acute; petite elderly woman. She appears well. Skin- rash-none,excoriation- none.  Lymphadenopathy- none Head- atraumatic            Eyes- Gross vision intact, PERRLA,  conjunctivae clear secretions            Ears- +hard of hearingl            Nose- Clear, no-Septal dev, mucus, polyps, erosion, perforation             Throat- Mallampati II , mucosa clear , drainage- none  tonsils- atrophic Neck- flexible , trachea midline, no stridor , thyroid+ nodule R, carotid no bruit Chest - symmetrical excursion , unlabored           Heart/CV- RRR , no murmur , no gallop  , no rub, nl s1 s2                           -  JVD- none , edema- none, stasis changes- none, varices- none           Lung- clear, cough- none , dullness-none, rub- none           Chest wall- kyphosis Abd-  Br/ Gen/ Rectal- Not done, not indicated Extrem- cyanosis- none, clubbing, none, atrophy- none, strength- nl Neuro- grossly intact to observation

## 2014-03-31 NOTE — Patient Instructions (Signed)
Order- CXR    Dx COPD, bronchiectasis  Order- ONOX room air

## 2014-03-31 NOTE — Telephone Encounter (Signed)
Please give her CXR report= Radiologist notes enlargement at stem of right lung and recommends CT chest with contrast. Please order this and BMET for dx abnormal CXR

## 2014-03-31 NOTE — Telephone Encounter (Signed)
ATC NA WCB 

## 2014-04-01 NOTE — Telephone Encounter (Signed)
Spoke with patient regarding results of CXR; she is aware of results and understands what we are asking for. Pt states she has Stage 3 Kidney disease and would like to speak with her kidney MD first before going through with a CT chest with contrast. Pt is aware that I will send message and speak with CY to see if a CT Chest w/o contrast would be an option due to kidney disease.

## 2014-04-01 NOTE — Telephone Encounter (Signed)
Contrast dye will help clarify if there is a problem in the part of the lung where we are looking, so agree with asking her kidney doctor. Otherwise safer to just do her chest CT without contrast

## 2014-04-01 NOTE — Telephone Encounter (Signed)
Pt is aware that CY has recommended we order CT Chest WITHOUT Contrast. Pt aware that our PCC's will contact her with date, time, and location of CT Chest. Nothing more needed at this time.

## 2014-04-01 NOTE — Telephone Encounter (Signed)
LMTCB

## 2014-04-01 NOTE — Telephone Encounter (Signed)
Pt spoke with her kidney dr and they stated that we would need to admit the patient to the hospital and hydrate well enough with fluids in order to have CT Chest with contrast due to her kidney disease. Her Nephrologist is Elgie CongoJean Zekan MD [phone] [facility] MD is currently working in hospital this week.  Larita FifeLynn is her office nurse this week.   As CT chest with contrast is still an option per patients nephrologist I will send message to CY to advise.   Thanks.

## 2014-04-01 NOTE — Telephone Encounter (Signed)
Please order CT chest WITHOUT contrast.

## 2014-04-02 NOTE — Assessment & Plan Note (Signed)
We're watching recurrence or progression

## 2014-04-02 NOTE — Assessment & Plan Note (Addendum)
What she currently describes is much more consistent with herself admitted anxiety and not an exacerbation of her chronic bronchitis. We will try to be sure of this. Plan-chest x-ray, overnight oximetry

## 2014-04-02 NOTE — Addendum Note (Signed)
Addended by: Jetty DuhamelYOUNG, Firas Guardado D on: 04/02/2014 09:55 PM   Modules accepted: Kipp BroodSmartSet

## 2014-04-05 ENCOUNTER — Telehealth: Payer: Self-pay | Admitting: Internal Medicine

## 2014-04-05 NOTE — Telephone Encounter (Signed)
Noted. Will let CY know about this.

## 2014-04-05 NOTE — Telephone Encounter (Signed)
Noted- Patient refuses to have over night oximetry done

## 2014-04-06 ENCOUNTER — Telehealth: Payer: Self-pay | Admitting: Internal Medicine

## 2014-04-06 NOTE — Telephone Encounter (Signed)
Spoke with Kennyth ArnoldStacy with CT and informed of Dr Roxy CedarYoung's recommendations. CT will be canceled.

## 2014-04-06 NOTE — Telephone Encounter (Signed)
Will sign of. Nothing further needed at this time.

## 2014-04-06 NOTE — Telephone Encounter (Signed)
Sp[facility]-states patient was scheduled for CT this Friday but cancelled through the automated phone call; patient also left message for Kennyth ArnoldStacy to contact her so she could explain why she cancelled. Stacy with CT Dept spoke with patient and Ms. Anna GenreConroy told her she would rather die than know if anything is going on and seems more focused on other things right now than doing this CT Chest.    Will [facility] to [facility] and ask he call the patient. Kennyth ArnoldStacy does not need a call back. Thanks.

## 2014-04-06 NOTE — Telephone Encounter (Signed)
We understand she chose not to have the CT scan done. That's ok. We can look at getting a CXR at next office visit. If she needs anything before then, please let me know.

## 2014-04-07 ENCOUNTER — Encounter (HOSPITAL_COMMUNITY)
Admission: RE | Admit: 2014-04-07 | Discharge: 2014-04-07 | Disposition: A | Discharge status: Home / Self Care | Payer: Medicare Other | Source: Ambulatory Visit | Attending: Orthopedic Surgery | Admitting: Orthopedic Surgery

## 2014-04-07 ENCOUNTER — Encounter (HOSPITAL_COMMUNITY): Payer: Self-pay

## 2014-04-07 ENCOUNTER — Other Ambulatory Visit (HOSPITAL_COMMUNITY): Payer: Self-pay | Admitting: *Deleted

## 2014-04-07 HISTORY — DX: Pneumonia, unspecified organism: J18.9

## 2014-04-07 HISTORY — DX: Irritable bowel syndrome, unspecified: K58.9

## 2014-04-07 HISTORY — DX: Xerosis cutis: L85.3

## 2014-04-07 HISTORY — DX: Anemia, unspecified: D64.9

## 2014-04-07 LAB — URINALYSIS, ROUTINE W REFLEX MICROSCOPIC
BILIRUBIN URINE: NEGATIVE
GLUCOSE, UA: NEGATIVE mg/dL
Hgb urine dipstick: NEGATIVE
KETONES UR: NEGATIVE mg/dL
LEUKOCYTES UA: NEGATIVE
Nitrite: NEGATIVE
Protein, ur: NEGATIVE mg/dL
Specific Gravity, Urine: 1.011 (ref 1.005–1.030)
Urobilinogen, UA: 0.2 mg/dL (ref 0.0–1.0)
pH: 5 (ref 5.0–8.0)

## 2014-04-07 LAB — SURGICAL PCR SCREEN
MRSA, PCR: NEGATIVE
STAPHYLOCOCCUS AUREUS: NEGATIVE

## 2014-04-07 LAB — BASIC METABOLIC PANEL
ANION GAP: 11 (ref 5–15)
BUN: 16 mg/dL (ref 6–23)
CALCIUM: 9.9 mg/dL (ref 8.4–10.5)
CO2: 26 mmol/L (ref 19–32)
Chloride: 99 mmol/L (ref 96–112)
Creatinine, Ser: 1.14 mg/dL — ABNORMAL HIGH (ref 0.50–1.10)
GFR calc non Af Amer: 43 mL/min — ABNORMAL LOW (ref >90)
GFR, EST AFRICAN AMERICAN: 50 mL/min — AB (ref >90)
Glucose, Bld: 91 mg/dL (ref 70–99)
POTASSIUM: 3.7 mmol/L (ref 3.5–5.1)
SODIUM: 136 mmol/L (ref 135–145)

## 2014-04-07 LAB — CBC WITH DIFFERENTIAL/PLATELET
Basophils Absolute: 0.1 10*3/uL (ref 0.0–0.1)
Basophils Relative: 1 % (ref 0–1)
EOS ABS: 0.1 10*3/uL (ref 0.0–0.7)
Eosinophils Relative: 1 % (ref 0–5)
HEMATOCRIT: 33.5 % — AB (ref 36.0–46.0)
Hemoglobin: 11.1 g/dL — ABNORMAL LOW (ref 12.0–15.0)
Lymphocytes Relative: 30 % (ref 12–46)
Lymphs Abs: 1.8 10*3/uL (ref 0.7–4.0)
MCH: 28.9 pg (ref 26.0–34.0)
MCHC: 33.1 g/dL (ref 30.0–36.0)
MCV: 87.2 fL (ref 78.0–100.0)
Monocytes Absolute: 0.3 10*3/uL (ref 0.1–1.0)
Monocytes Relative: 6 % (ref 3–12)
Neutro Abs: 3.7 10*3/uL (ref 1.7–7.7)
Neutrophils Relative %: 62 % (ref 43–77)
Platelets: 290 10*3/uL (ref 150–400)
RBC: 3.84 MIL/uL — AB (ref 3.87–5.11)
RDW: 14.7 % (ref 11.5–15.5)
WBC: 5.9 10*3/uL (ref 4.0–10.5)

## 2014-04-07 LAB — APTT: APTT: 41 s — AB (ref 24–37)

## 2014-04-07 LAB — PROTIME-INR
INR: 1.17 (ref 0.00–1.49)
Prothrombin Time: 15 seconds (ref 11.6–15.2)

## 2014-04-07 NOTE — Pre-Procedure Instructions (Signed)
Orvan JulySarah A Rosato  04/07/2014   Your procedure is scheduled on:  Wednesday, April 20, 2014 at 12:45 PM.   [facility] at 10:45 AM.   Call this number if you have problems the morning of surgery: [phone]               Any questions prior to day of surgery, please call [phone] between 8 & 4 PM.   Remember:   Do not eat food or drink liquids after midnight Tuesday, 04/19/14.  Take these medicines the morning of surgery with A SIP OF WATER: Amlodipine (Norvasc), Famotidine (Pepcid), Levothyroxine (Synthroid), Metoprolol (Toprol XL), Spiriva, Albuterol inhaler - if needed, Atrovent (Ipratropium) nasal spray - if needed, Oxycodone or Tramadol - if needed, Ativan - if needed, Tizanidine - if needed.  Stop Aspirin and Vitamins 7 days prior to surgery.   Do not wear jewelry, make-up or nail polish.  Do not wear lotions, powders, or perfumes. You may wear deodorant.  Do not shave 48 hours prior to surgery.   Do not bring valuables to the hospital.  [facility] is not responsible                  for any belongings or valuables.               Contacts, dentures or bridgework may not be worn into surgery.  Leave suitcase in the car. After surgery it may be brought to your room.  For patients admitted to the hospital, discharge time is determined by your                treatment team.               Special Instructions: [place] - Preparing for Surgery  Before surgery, you can play an important role.  Because skin is not sterile, your skin needs to be as free of germs as possible.  You can reduce the number of germs on you skin by washing with CHG (chlorahexidine gluconate) soap before surgery.  CHG is an antiseptic cleaner which kills germs and bonds with the skin to continue killing germs even after washing.  Please DO NOT use if you have an allergy to CHG or antibacterial soaps.  If your skin becomes reddened/irritated stop using the CHG  and inform your nurse when you arrive at Short Stay.  Do not shave (including legs and underarms) for at least 48 hours prior to the first CHG shower.  You may shave your face.  Please follow these instructions carefully:   1.  Shower with CHG Soap the night before surgery and the                                morning of Surgery.  2.  If you choose to wash your hair, wash your hair first as usual with your       normal shampoo.  3.  After you shampoo, rinse your hair and body thoroughly to remove the                      Shampoo.  4.  Use CHG as you would any other liquid soap.  You can apply chg directly       to the skin and wash gently with scrungie or a clean washcloth.  5.  Apply the CHG Soap to  your body ONLY FROM THE NECK DOWN.        Do not use on open wounds or open sores.  Avoid contact with your eyes, ears, mouth and genitals (private parts).  Wash genitals (private parts)  with your normal soap.  6.  Wash thoroughly, paying special attention to the area where your surgery        will be performed.  7.  Thoroughly rinse your body with warm water from the neck down.  8.  DO NOT shower/wash with your normal soap after using and rinsing off       the CHG Soap.  9.  Pat yourself dry with a clean towel.            10.  Wear clean pajamas.            11.  Place clean sheets on your bed the night of your first shower and do not        sleep with pets.  Day of Surgery  Do not apply any lotions the morning of surgery.  Please wear clean clothes to the hospital.                     Please read over the following fact sheets that you were given: Pain Booklet, Coughing and Deep Breathing, MRSA Information and Surgical Site Infection Prevention

## 2014-04-07 NOTE — Progress Notes (Signed)
Pt denies chest pain. States years ago she wore a halter monitor for ? Irregular heartrate, but states nothing was found. States she can't remember the cardiologist's name, [place] [facility]. She states she has sob with anxiety and she has been anxious about this surgery and for the pain. She is to have a chest CT soon for a "mass" that was found on her CXR.   Pt lives alone, has neighbors that check in on her frequently and help her as needed. Family is not as helpful per pt. Pt's friend who is here with her states pt's son causes much anxiety in pt. Pt is concerned about living alone, feeling down (denies being suicidal) about her hip pain.

## 2014-04-08 ENCOUNTER — Other Ambulatory Visit: Payer: Medicare Other

## 2014-04-18 NOTE — H&P (Signed)
TOTAL HIP ADMISSION H&P  Patient is admitted for left total hip arthroplasty.  Subjective:  Chief Complaint: left hip pain  HPI: [name], 81 y.o. female, has a history of pain and functional disability in the left hip(s) due to trauma and patient has failed non-surgical conservative treatments for greater than 12 weeks to include NSAID's and/or analgesics, flexibility and strengthening excercises, use of assistive devices, weight reduction as appropriate and activity modification.  Onset of symptoms was abrupt starting >10 years ago with rapidlly worsening course since that time.The patient noted no past surgery on the left hip(s).  Patient currently rates pain in the left hip at 10 out of 10 with activity. Patient has night pain, worsening of pain with activity and weight bearing, trendelenberg gait, pain that interfers with activities of daily living, pain with passive range of motion and crepitus. Patient has evidence of joint space narrowing by imaging studies. This condition presents safety issues increasing the risk of falls.   There is no current active infection.  Patient Active Problem List   Diagnosis Date Noted  . Pain in joint, ankle and foot 08/10/2012  . Contracture of toe of left foot 08/10/2012  . Rhinitis, nonallergic, chronic 01/19/2012  . GERD (gastroesophageal reflux disease) 01/19/2012  . Atypical mycobacterial infection of lung 07/03/2011  . Lung nodule 07/03/2011  . Osteoarthritis of right hip 04/26/2011  . Aseptic necrosis 01/27/2011  . COPD (chronic obstructive pulmonary disease) with chronic bronchitis 12/17/2010   Past Medical History  Diagnosis Date  . Hypertension   . COPD (chronic obstructive pulmonary disease)   . Kidney disease   . Shortness of breath   . Hypothyroidism   . GERD (gastroesophageal reflux disease)   . Arthritis   . Weight loss     10-12 lbs in 2 months (03/2011)  . Pneumonia   . Anemia   . Irritable bowel syndrome   . Dry skin    . Abnormal heart rhythm     pt not sure what kind.    Past Surgical History  Procedure Laterality Date  . Tonsillectomy    . Thyroidectomy      Left  . Esophagogastroduodenoscopy    . Colonoscopy    . Anterior and posterior vaginal repair    . Cardiac catheterization      6 years ago  . Total hip arthroplasty  04/24/2011    Procedure: TOTAL HIP ARTHROPLASTY;  Surgeon: Nestor LewandowskyFrank J Rowan, MD;  Location: [facility] OR;  Service: Orthopedics;  Laterality: Right;  DEPUY/PINNACLE CUP, SUMMIT BASIC STEM  . Eye surgery Bilateral 09/2013    cataract surgery with lens implant  . Toe amputation Left     2nd toe    No prescriptions prior to admission   Allergies  Allergen Reactions  . Avelox [Moxifloxacin Hcl In Nacl] Other (See Comments)    Flushes face  . Biaxin [Clarithromycin]     Diarrhea and bad taste in mouth  . Nsaids     Does not take due to kidney disease    History  Substance Use Topics  . Smoking status: Former Smoker -- 2.00 packs/day for 26 years    Types: Cigarettes    Quit date: 03/04/1982  . Smokeless tobacco: Never Used  . Alcohol Use: 0.6 oz/week    1 Shots of liquor per week    Family History  Problem Relation Age of Onset  . Emphysema Sister     smoker  . Allergies Brother   . Heart  disease Brother   . Rheum arthritis Daughter   . Cancer Mother     GI  . Cancer Brother     throat  . Cancer Sister     unsure     Review of Systems  Constitutional: Negative.   HENT:       Goiter  Eyes:       Cataracts  Respiratory:       Emphysema  Cardiovascular: Negative.   Gastrointestinal:       Acid reflux  Genitourinary: Negative.   Musculoskeletal: Positive for joint pain.  Skin: Negative.   Neurological: Negative.   Endo/Heme/Allergies: Negative.   Psychiatric/Behavioral: Negative.     Objective:  Physical Exam  Constitutional: She is oriented to person, place, and time. She appears well-developed and well-nourished.  HENT:  Head: Normocephalic and  atraumatic.  Eyes: Pupils are equal, round, and reactive to light.  Neck: Normal range of motion. Neck supple.  Cardiovascular: Intact distal pulses.   Respiratory: Effort normal.  Musculoskeletal:  She is left hip is quite irritable to internal or external rotation.  Her right total hip is asymptomatic.  There is no swelling or erythema over the hip.  Foot tap is mildly positive today.  Neurological: She is alert and oriented to person, place, and time.  Skin: Skin is warm and dry.  Psychiatric: She has a normal mood and affect. Her behavior is normal. Judgment and thought content normal.    Vital signs in last 24 hours:    Labs:   Estimated body mass index is 22.07 kg/(m^2) as calculated from the following:   Height as of 12/28/13: 5' (1.524 m).   Weight as of 03/26/14: 51.256 kg (113 lb).   Imaging Review Plain radiographs demonstrate AP pelvis and crosstable lateral of the left hip shows near complete chondrolysis superiorly along the weightbearing dome.  In comparison to x-rays a redundant in December.  Assessment/Plan:  End stage arthritis, left hip(s)  The patient history, physical examination, clinical judgement of the provider and imaging studies are consistent with end stage degenerative joint disease of the left hip(s) and total hip arthroplasty is deemed medically necessary. The treatment options including medical management, injection therapy, arthroscopy and arthroplasty were discussed at length. The risks and benefits of total hip arthroplasty were presented and reviewed. The risks due to aseptic loosening, infection, stiffness, dislocation/subluxation,  thromboembolic complications and other imponderables were discussed.  The patient acknowledged the explanation, agreed to proceed with the plan and consent was signed. Patient is being admitted for inpatient treatment for surgery, pain control, PT, OT, prophylactic antibiotics, VTE prophylaxis, progressive ambulation and  ADL's and discharge planning.The patient is planning to be discharged to skilled nursing facility

## 2014-04-19 MED ORDER — CEFAZOLIN SODIUM-DEXTROSE 2-3 GM-% IV SOLR
2.0000 g | INTRAVENOUS | Status: AC
  Administered 2014-04-20: 2 g via INTRAVENOUS

## 2014-04-19 MED ORDER — DEXTROSE-NACL 5-0.45 % IV SOLN: INTRAVENOUS | Status: DC

## 2014-04-19 NOTE — Anesthesia Preprocedure Evaluation (Addendum)
Anesthesia Evaluation  Patient identified by MRN, date of birth, ID band Patient awake    Reviewed: Allergy & Precautions, NPO status , Patient's Chart, lab work & pertinent test results, reviewed documented beta blocker date and time   Airway Mallampati: III  TM Distance: >3 FB Neck ROM: Full    Dental  (+) Teeth Intact, Dental Advisory Given   Pulmonary COPD COPD inhaler, former smoker (quit 1984 50 pack year),  Right hilar adenopathy possible on CXR 04/01/2014 breath sounds clear to auscultation        Cardiovascular hypertension, Pt. on medications Rhythm:Regular Rate:Normal     Neuro/Psych    GI/Hepatic GERD-  Controlled and Medicated,  Endo/Other  Hypothyroidism   Renal/GU Renal InsufficiencyRenal diseaseGFR 50     Musculoskeletal   Abdominal   Peds  Hematology 11/35   Anesthesia Other Findings   Reproductive/Obstetrics                            Anesthesia Physical Anesthesia Plan  ASA: III  Anesthesia Plan: General   Post-op Pain Management:    Induction: Intravenous  Airway Management Planned: Oral ETT  Additional Equipment:   Intra-op Plan:   Post-operative Plan: Extubation in OR  Informed Consent: I have reviewed the patients History and Physical, chart, labs and discussed the procedure including the risks, benefits and alternatives for the proposed anesthesia with the patient or authorized representative who has indicated his/her understanding and acceptance.     Plan Discussed with:   Anesthesia Plan Comments: (Pt refused spinal. Plan GETA.)       Anesthesia Quick Evaluation

## 2014-04-20 ENCOUNTER — Encounter (HOSPITAL_COMMUNITY): Payer: Self-pay | Admitting: *Deleted

## 2014-04-20 ENCOUNTER — Inpatient Hospital Stay (HOSPITAL_COMMUNITY): Payer: Medicare Other | Admitting: Anesthesiology

## 2014-04-20 ENCOUNTER — Inpatient Hospital Stay (HOSPITAL_COMMUNITY): Payer: Medicare Other

## 2014-04-20 ENCOUNTER — Encounter (HOSPITAL_COMMUNITY)
Admission: RE | Disposition: A | Discharge status: Skilled Nursing Facility | Payer: Self-pay | Source: Ambulatory Visit | Attending: Orthopedic Surgery

## 2014-04-20 ENCOUNTER — Inpatient Hospital Stay (HOSPITAL_COMMUNITY)
Admission: RE | Admit: 2014-04-20 | Discharge: 2014-04-25 | DRG: 470 | Disposition: A | Discharge status: Skilled Nursing Facility | Payer: Medicare Other | Source: Ambulatory Visit | Attending: Orthopedic Surgery | Admitting: Orthopedic Surgery

## 2014-04-20 DIAGNOSIS — M1610 Unilateral primary osteoarthritis, unspecified hip: Secondary | ICD-10-CM | POA: Diagnosis present

## 2014-04-20 DIAGNOSIS — D62 Acute posthemorrhagic anemia: Secondary | ICD-10-CM | POA: Diagnosis not present

## 2014-04-20 DIAGNOSIS — E039 Hypothyroidism, unspecified: Secondary | ICD-10-CM | POA: Diagnosis present

## 2014-04-20 DIAGNOSIS — I10 Essential (primary) hypertension: Secondary | ICD-10-CM | POA: Diagnosis present

## 2014-04-20 DIAGNOSIS — Z87891 Personal history of nicotine dependence: Secondary | ICD-10-CM

## 2014-04-20 DIAGNOSIS — J310 Chronic rhinitis: Secondary | ICD-10-CM | POA: Diagnosis present

## 2014-04-20 DIAGNOSIS — Z89422 Acquired absence of other left toe(s): Secondary | ICD-10-CM

## 2014-04-20 DIAGNOSIS — M1612 Unilateral primary osteoarthritis, left hip: Secondary | ICD-10-CM

## 2014-04-20 DIAGNOSIS — K219 Gastro-esophageal reflux disease without esophagitis: Secondary | ICD-10-CM | POA: Diagnosis present

## 2014-04-20 DIAGNOSIS — Z96641 Presence of right artificial hip joint: Secondary | ICD-10-CM | POA: Diagnosis present

## 2014-04-20 DIAGNOSIS — M1611 Unilateral primary osteoarthritis, right hip: Secondary | ICD-10-CM | POA: Diagnosis present

## 2014-04-20 DIAGNOSIS — Z01818 Encounter for other preprocedural examination: Secondary | ICD-10-CM

## 2014-04-20 DIAGNOSIS — J449 Chronic obstructive pulmonary disease, unspecified: Secondary | ICD-10-CM | POA: Diagnosis present

## 2014-04-20 DIAGNOSIS — Z96642 Presence of left artificial hip joint: Secondary | ICD-10-CM

## 2014-04-20 DIAGNOSIS — M25552 Pain in left hip: Secondary | ICD-10-CM | POA: Diagnosis present

## 2014-04-20 DIAGNOSIS — R9389 Abnormal findings on diagnostic imaging of other specified body structures: Secondary | ICD-10-CM

## 2014-04-20 HISTORY — PX: TOTAL HIP ARTHROPLASTY: SHX124

## 2014-04-20 SURGERY — ARTHROPLASTY, HIP, TOTAL,POSTERIOR APPROACH
Anesthesia: General | Site: Hip | Laterality: Left

## 2014-04-20 MED ORDER — BUPIVACAINE-EPINEPHRINE (PF) 0.5% -1:200000 IJ SOLN
INTRAMUSCULAR | Status: AC
  Filled 2014-04-20: qty 30

## 2014-04-20 MED ORDER — MIDAZOLAM HCL 2 MG/2ML IJ SOLN
1.0000 mg | INTRAMUSCULAR | Status: DC | PRN
  Administered 2014-04-20: 1 mg via INTRAVENOUS

## 2014-04-20 MED ORDER — HYDROMORPHONE HCL 1 MG/ML IJ SOLN
INTRAMUSCULAR | Status: AC
  Administered 2014-04-20: 1 mg
  Filled 2014-04-20: qty 1

## 2014-04-20 MED ORDER — TIOTROPIUM BROMIDE MONOHYDRATE 18 MCG IN CAPS
18.0000 ug | ORAL_CAPSULE | Freq: Every day | RESPIRATORY_TRACT | Status: DC
  Administered 2014-04-22 – 2014-04-25 (×4): 18 ug via RESPIRATORY_TRACT
  Filled 2014-04-20: qty 5

## 2014-04-20 MED ORDER — ONDANSETRON HCL 4 MG/2ML IJ SOLN
INTRAMUSCULAR | Status: DC | PRN
  Administered 2014-04-20: 4 mg via INTRAVENOUS

## 2014-04-20 MED ORDER — IPRATROPIUM BROMIDE 0.06 % NA SOLN
2.0000 | Freq: Four times a day (QID) | NASAL | Status: DC | PRN
  Filled 2014-04-20: qty 15

## 2014-04-20 MED ORDER — METHOCARBAMOL 500 MG PO TABS
500.0000 mg | ORAL_TABLET | Freq: Four times a day (QID) | ORAL | Status: DC | PRN
  Administered 2014-04-22 – 2014-04-25 (×5): 500 mg via ORAL
  Filled 2014-04-20 (×6): qty 1

## 2014-04-20 MED ORDER — NEOSTIGMINE METHYLSULFATE 10 MG/10ML IV SOLN
INTRAVENOUS | Status: DC | PRN
  Administered 2014-04-20: 3 mg via INTRAVENOUS

## 2014-04-20 MED ORDER — ONDANSETRON HCL 4 MG/2ML IJ SOLN
4.0000 mg | Freq: Four times a day (QID) | INTRAMUSCULAR | Status: DC | PRN
  Administered 2014-04-22: 4 mg via INTRAVENOUS
  Filled 2014-04-20: qty 2

## 2014-04-20 MED ORDER — METOCLOPRAMIDE HCL 10 MG PO TABS: 5.0000 mg | ORAL_TABLET | Freq: Three times a day (TID) | ORAL | Status: DC | PRN

## 2014-04-20 MED ORDER — METHOCARBAMOL 1000 MG/10ML IJ SOLN
500.0000 mg | Freq: Four times a day (QID) | INTRAVENOUS | Status: DC | PRN
  Administered 2014-04-20: 500 mg via INTRAVENOUS
  Filled 2014-04-20 (×2): qty 5

## 2014-04-20 MED ORDER — ACETAMINOPHEN 650 MG RE SUPP: 650.0000 mg | Freq: Four times a day (QID) | RECTAL | Status: DC | PRN

## 2014-04-20 MED ORDER — ACETAMINOPHEN 10 MG/ML IV SOLN
1000.0000 mg | Freq: Once | INTRAVENOUS | Status: AC
  Administered 2014-04-20: 1000 mg via INTRAVENOUS

## 2014-04-20 MED ORDER — METOPROLOL SUCCINATE ER 50 MG PO TB24
50.0000 mg | ORAL_TABLET | Freq: Two times a day (BID) | ORAL | Status: DC
  Administered 2014-04-20 – 2014-04-25 (×6): 50 mg via ORAL
  Filled 2014-04-20 (×8): qty 1

## 2014-04-20 MED ORDER — HYDROMORPHONE HCL 1 MG/ML IJ SOLN
INTRAMUSCULAR | Status: AC
  Administered 2014-04-20: 0.5 mg
  Filled 2014-04-20: qty 1

## 2014-04-20 MED ORDER — MENTHOL 3 MG MT LOZG
1.0000 | LOZENGE | OROMUCOSAL | Status: DC | PRN
  Administered 2014-04-23: 3 mg via ORAL
  Filled 2014-04-20 (×2): qty 9

## 2014-04-20 MED ORDER — LIDOCAINE HCL (CARDIAC) 20 MG/ML IV SOLN
INTRAVENOUS | Status: DC | PRN
  Administered 2014-04-20: 50 mg via INTRAVENOUS

## 2014-04-20 MED ORDER — FENTANYL CITRATE 0.05 MG/ML IJ SOLN
INTRAMUSCULAR | Status: AC
  Filled 2014-04-20: qty 5

## 2014-04-20 MED ORDER — MIDAZOLAM HCL 2 MG/2ML IJ SOLN
INTRAMUSCULAR | Status: AC
  Filled 2014-04-20: qty 2

## 2014-04-20 MED ORDER — OXYCODONE HCL 5 MG PO TABS
5.0000 mg | ORAL_TABLET | ORAL | Status: DC | PRN
  Administered 2014-04-20 (×2): 10 mg via ORAL
  Administered 2014-04-21: 5 mg via ORAL
  Administered 2014-04-21 – 2014-04-25 (×8): 10 mg via ORAL
  Filled 2014-04-20: qty 1
  Filled 2014-04-20 (×9): qty 2

## 2014-04-20 MED ORDER — LACTATED RINGERS IV SOLN
INTRAVENOUS | Status: DC | PRN
  Administered 2014-04-20: 13:00:00 via INTRAVENOUS

## 2014-04-20 MED ORDER — ALBUTEROL SULFATE (2.5 MG/3ML) 0.083% IN NEBU: 2.5000 mg | INHALATION_SOLUTION | Freq: Four times a day (QID) | RESPIRATORY_TRACT | Status: DC | PRN

## 2014-04-20 MED ORDER — ALUMINUM HYDROXIDE GEL 320 MG/5ML PO SUSP
15.0000 mL | ORAL | Status: DC | PRN
  Filled 2014-04-20: qty 30

## 2014-04-20 MED ORDER — FENTANYL CITRATE 0.05 MG/ML IJ SOLN
INTRAMUSCULAR | Status: AC
  Administered 2014-04-20: 50 ug via INTRAVENOUS
  Filled 2014-04-20: qty 2

## 2014-04-20 MED ORDER — PHENOL 1.4 % MT LIQD: 1.0000 | OROMUCOSAL | Status: DC | PRN

## 2014-04-20 MED ORDER — FENTANYL CITRATE 0.05 MG/ML IJ SOLN
25.0000 ug | INTRAMUSCULAR | Status: DC | PRN
  Administered 2014-04-20 (×2): 50 ug via INTRAVENOUS

## 2014-04-20 MED ORDER — FAMOTIDINE 20 MG PO TABS
40.0000 mg | ORAL_TABLET | Freq: Every day | ORAL | Status: DC
  Administered 2014-04-20 – 2014-04-21 (×2): 40 mg via ORAL
  Filled 2014-04-20 (×2): qty 2

## 2014-04-20 MED ORDER — DULOXETINE HCL 60 MG PO CPEP: 60.0000 mg | ORAL_CAPSULE | ORAL | Status: DC

## 2014-04-20 MED ORDER — PROPOFOL 10 MG/ML IV BOLUS
INTRAVENOUS | Status: DC | PRN
  Administered 2014-04-20: 100 mg via INTRAVENOUS

## 2014-04-20 MED ORDER — ACETAMINOPHEN 325 MG PO TABS
650.0000 mg | ORAL_TABLET | Freq: Four times a day (QID) | ORAL | Status: DC | PRN
  Administered 2014-04-23 – 2014-04-25 (×5): 650 mg via ORAL
  Filled 2014-04-20 (×6): qty 2

## 2014-04-20 MED ORDER — KCL IN DEXTROSE-NACL 20-5-0.45 MEQ/L-%-% IV SOLN
INTRAVENOUS | Status: DC
  Administered 2014-04-20 – 2014-04-21 (×3): via INTRAVENOUS
  Administered 2014-04-22: 125 mL/h via INTRAVENOUS
  Administered 2014-04-23: 05:00:00 via INTRAVENOUS
  Filled 2014-04-20 (×18): qty 1000

## 2014-04-20 MED ORDER — ONDANSETRON HCL 4 MG PO TABS: 4.0000 mg | ORAL_TABLET | Freq: Four times a day (QID) | ORAL | Status: DC | PRN

## 2014-04-20 MED ORDER — TRANEXAMIC ACID 100 MG/ML IV SOLN
2000.0000 mg | Freq: Once | INTRAVENOUS | Status: AC
  Administered 2014-04-20: 2000 mg via TOPICAL
  Filled 2014-04-20: qty 20

## 2014-04-20 MED ORDER — BUPIVACAINE-EPINEPHRINE 0.5% -1:200000 IJ SOLN
INTRAMUSCULAR | Status: DC | PRN
Start: 2014-04-20 — End: 2014-04-20
  Administered 2014-04-20: 10 mL

## 2014-04-20 MED ORDER — DOCUSATE SODIUM 100 MG PO CAPS
100.0000 mg | ORAL_CAPSULE | Freq: Two times a day (BID) | ORAL | Status: DC
  Administered 2014-04-20 – 2014-04-25 (×10): 100 mg via ORAL
  Filled 2014-04-20 (×10): qty 1

## 2014-04-20 MED ORDER — VECURONIUM BROMIDE 10 MG IV SOLR
INTRAVENOUS | Status: DC | PRN
  Administered 2014-04-20: 3 mg via INTRAVENOUS

## 2014-04-20 MED ORDER — BISACODYL 5 MG PO TBEC
5.0000 mg | DELAYED_RELEASE_TABLET | Freq: Every day | ORAL | Status: DC | PRN
  Administered 2014-04-25: 5 mg via ORAL
  Filled 2014-04-20: qty 1

## 2014-04-20 MED ORDER — OXYCODONE-ACETAMINOPHEN 5-325 MG PO TABS: 1.0000 | ORAL_TABLET | ORAL | Status: DC | PRN

## 2014-04-20 MED ORDER — MIDAZOLAM HCL 2 MG/2ML IJ SOLN
INTRAMUSCULAR | Status: AC
  Administered 2014-04-20: 1 mg
  Filled 2014-04-20: qty 2

## 2014-04-20 MED ORDER — 0.9 % SODIUM CHLORIDE (POUR BTL) OPTIME
TOPICAL | Status: DC | PRN
  Administered 2014-04-20: 1000 mL

## 2014-04-20 MED ORDER — HYDROMORPHONE HCL 1 MG/ML IJ SOLN: 0.5000 mg | INTRAMUSCULAR | Status: DC | PRN

## 2014-04-20 MED ORDER — ACETAMINOPHEN 10 MG/ML IV SOLN
INTRAVENOUS | Status: AC
  Filled 2014-04-20: qty 100

## 2014-04-20 MED ORDER — AMLODIPINE BESYLATE 5 MG PO TABS
5.0000 mg | ORAL_TABLET | Freq: Every day | ORAL | Status: DC
  Administered 2014-04-20 – 2014-04-22 (×3): 5 mg via ORAL
  Filled 2014-04-20 (×6): qty 1

## 2014-04-20 MED ORDER — CEFUROXIME SODIUM 1.5 G IJ SOLR
INTRAMUSCULAR | Status: DC | PRN
  Administered 2014-04-20: 1.5 g

## 2014-04-20 MED ORDER — DIPHENHYDRAMINE HCL 12.5 MG/5ML PO ELIX: 12.5000 mg | ORAL_SOLUTION | ORAL | Status: DC | PRN

## 2014-04-20 MED ORDER — METOPROLOL SUCCINATE ER 50 MG PO TB24
50.0000 mg | ORAL_TABLET | Freq: Once | ORAL | Status: AC
  Administered 2014-04-20: 50 mg via ORAL
  Filled 2014-04-20: qty 1

## 2014-04-20 MED ORDER — MEPERIDINE HCL 25 MG/ML IJ SOLN: 6.2500 mg | INTRAMUSCULAR | Status: DC | PRN

## 2014-04-20 MED ORDER — PROMETHAZINE HCL 25 MG/ML IJ SOLN: 6.2500 mg | INTRAMUSCULAR | Status: DC | PRN

## 2014-04-20 MED ORDER — HYDROMORPHONE HCL 1 MG/ML IJ SOLN
1.0000 mg | INTRAMUSCULAR | Status: DC | PRN
  Administered 2014-04-20 – 2014-04-22 (×7): 1 mg via INTRAVENOUS
  Filled 2014-04-20 (×8): qty 1

## 2014-04-20 MED ORDER — LORAZEPAM 0.5 MG PO TABS
0.5000 mg | ORAL_TABLET | Freq: Every day | ORAL | Status: DC | PRN
  Administered 2014-04-20 – 2014-04-25 (×5): 0.5 mg via ORAL
  Filled 2014-04-20 (×7): qty 1

## 2014-04-20 MED ORDER — TRAMADOL HCL 50 MG PO TABS
50.0000 mg | ORAL_TABLET | Freq: Four times a day (QID) | ORAL | Status: DC | PRN
  Administered 2014-04-22 – 2014-04-24 (×5): 50 mg via ORAL
  Filled 2014-04-20 (×5): qty 1

## 2014-04-20 MED ORDER — GLYCOPYRROLATE 0.2 MG/ML IJ SOLN
INTRAMUSCULAR | Status: DC | PRN
  Administered 2014-04-20: .6 mg via INTRAVENOUS

## 2014-04-20 MED ORDER — SENNOSIDES-DOCUSATE SODIUM 8.6-50 MG PO TABS: 1.0000 | ORAL_TABLET | Freq: Every evening | ORAL | Status: DC | PRN

## 2014-04-20 MED ORDER — FENTANYL CITRATE 0.05 MG/ML IJ SOLN
INTRAMUSCULAR | Status: DC | PRN
  Administered 2014-04-20 (×2): 50 ug via INTRAVENOUS
  Administered 2014-04-20: 100 ug via INTRAVENOUS

## 2014-04-20 MED ORDER — LEVOTHYROXINE SODIUM 50 MCG PO TABS
50.0000 ug | ORAL_TABLET | Freq: Every day | ORAL | Status: DC
  Administered 2014-04-21 – 2014-04-25 (×5): 50 ug via ORAL
  Filled 2014-04-20 (×5): qty 1

## 2014-04-20 MED ORDER — MAGNESIUM CITRATE PO SOLN: 1.0000 | Freq: Once | ORAL | Status: AC | PRN

## 2014-04-20 MED ORDER — SODIUM CHLORIDE 0.9 % IR SOLN
Status: DC | PRN
  Administered 2014-04-20: 1000 mL

## 2014-04-20 MED ORDER — OXYCODONE HCL 5 MG PO TABS
ORAL_TABLET | ORAL | Status: AC
  Filled 2014-04-20: qty 2

## 2014-04-20 MED ORDER — PHENYLEPHRINE HCL 10 MG/ML IJ SOLN
INTRAMUSCULAR | Status: DC | PRN
  Administered 2014-04-20: 40 ug via INTRAVENOUS

## 2014-04-20 MED ORDER — CEFUROXIME SODIUM 1.5 G IJ SOLR
INTRAMUSCULAR | Status: AC
  Filled 2014-04-20: qty 1.5

## 2014-04-20 MED ORDER — CHLORHEXIDINE GLUCONATE 4 % EX LIQD: 60.0000 mL | Freq: Once | CUTANEOUS | Status: DC

## 2014-04-20 MED ORDER — METOCLOPRAMIDE HCL 5 MG/ML IJ SOLN
5.0000 mg | Freq: Three times a day (TID) | INTRAMUSCULAR | Status: DC | PRN
Start: 2014-04-20 — End: 2014-04-25

## 2014-04-20 MED ORDER — ASPIRIN EC 325 MG PO TBEC
325.0000 mg | DELAYED_RELEASE_TABLET | Freq: Every day | ORAL | Status: DC
  Administered 2014-04-21 – 2014-04-25 (×5): 325 mg via ORAL
  Filled 2014-04-20 (×5): qty 1

## 2014-04-20 MED ORDER — TIZANIDINE HCL 4 MG PO TABS
2.0000 mg | ORAL_TABLET | Freq: Three times a day (TID) | ORAL | Status: DC | PRN
  Administered 2014-04-24 – 2014-04-25 (×3): 2 mg via ORAL
  Filled 2014-04-20 (×3): qty 1

## 2014-04-20 MED ORDER — TIZANIDINE HCL 2 MG PO CAPS: 2.0000 mg | ORAL_CAPSULE | Freq: Three times a day (TID) | ORAL | Status: DC

## 2014-04-20 MED ORDER — ASPIRIN EC 325 MG PO TBEC: 325.0000 mg | DELAYED_RELEASE_TABLET | Freq: Two times a day (BID) | ORAL | Status: DC

## 2014-04-20 SURGICAL SUPPLY — 58 items
BAG DECANTER FOR FLEXI CONT (MISCELLANEOUS) ×1 IMPLANT
BLADE SAW SGTL 18X1.27X75 (BLADE) ×2 IMPLANT
BRUSH FEMORAL CANAL (MISCELLANEOUS) ×1 IMPLANT
CAPT HIP TOTAL 1 ×1 IMPLANT
CEMENT BONE DEPUY (Cement) ×2 IMPLANT
CEMENT RESTRICTOR DEPUY SZ 3 (Cement) ×1 IMPLANT
COVER BACK TABLE 24X17X13 BIG (DRAPES) IMPLANT
COVER SURGICAL LIGHT HANDLE (MISCELLANEOUS) ×3 IMPLANT
DRAPE IMP U-DRAPE 54X76 (DRAPES) ×2 IMPLANT
DRAPE ORTHO SPLIT 77X108 STRL (DRAPES) ×2
DRAPE PROXIMA HALF (DRAPES) ×2 IMPLANT
DRAPE SURG ORHT 6 SPLT 77X108 (DRAPES) ×1 IMPLANT
DRAPE U-SHAPE 47X51 STRL (DRAPES) ×2 IMPLANT
DRILL BIT 7/64X5 (BIT) ×2 IMPLANT
DRSG AQUACEL AG ADV 3.5X10 (GAUZE/BANDAGES/DRESSINGS) ×2 IMPLANT
DURAPREP 26ML APPLICATOR (WOUND CARE) ×2 IMPLANT
ELECT BLADE 4.0 EZ CLEAN MEGAD (MISCELLANEOUS) ×2
ELECT REM PT RETURN 9FT ADLT (ELECTROSURGICAL) ×2
ELECTRODE BLDE 4.0 EZ CLN MEGD (MISCELLANEOUS) IMPLANT
ELECTRODE REM PT RTRN 9FT ADLT (ELECTROSURGICAL) ×1 IMPLANT
GLOVE BIO SURGEON STRL SZ7.5 (GLOVE) ×2 IMPLANT
GLOVE BIO SURGEON STRL SZ8.5 (GLOVE) ×3 IMPLANT
GLOVE BIOGEL PI IND STRL 7.0 (GLOVE) IMPLANT
GLOVE BIOGEL PI IND STRL 8 (GLOVE) ×2 IMPLANT
GLOVE BIOGEL PI IND STRL 9 (GLOVE) ×1 IMPLANT
GLOVE BIOGEL PI INDICATOR 7.0 (GLOVE) ×1
GLOVE BIOGEL PI INDICATOR 8 (GLOVE) ×1
GLOVE BIOGEL PI INDICATOR 9 (GLOVE) ×1
GLOVE ECLIPSE 6.5 STRL STRAW (GLOVE) ×1 IMPLANT
GOWN STRL REUS W/ TWL LRG LVL3 (GOWN DISPOSABLE) ×2 IMPLANT
GOWN STRL REUS W/ TWL XL LVL3 (GOWN DISPOSABLE) ×3 IMPLANT
GOWN STRL REUS W/TWL LRG LVL3 (GOWN DISPOSABLE) ×2
GOWN STRL REUS W/TWL XL LVL3 (GOWN DISPOSABLE) ×6
HANDPIECE INTERPULSE COAX TIP (DISPOSABLE) ×2
HOOD PEEL AWAY FACE SHEILD DIS (HOOD) ×4 IMPLANT
KIT BASIN OR (CUSTOM PROCEDURE TRAY) ×2 IMPLANT
KIT ROOM TURNOVER OR (KITS) ×2 IMPLANT
MANIFOLD NEPTUNE II (INSTRUMENTS) ×2 IMPLANT
NEEDLE 22X1 1/2 (OR ONLY) (NEEDLE) ×2 IMPLANT
NS IRRIG 1000ML POUR BTL (IV SOLUTION) ×2 IMPLANT
PACK TOTAL JOINT (CUSTOM PROCEDURE TRAY) ×2 IMPLANT
PACK UNIVERSAL I (CUSTOM PROCEDURE TRAY) ×2 IMPLANT
PAD ARMBOARD 7.5X6 YLW CONV (MISCELLANEOUS) ×4 IMPLANT
PASSER SUT SWANSON 36MM LOOP (INSTRUMENTS) ×2 IMPLANT
PRESSURIZER FEMORAL UNIV (MISCELLANEOUS) ×1 IMPLANT
SET HNDPC FAN SPRY TIP SCT (DISPOSABLE) IMPLANT
SUT ETHIBOND 2 V 37 (SUTURE) ×2 IMPLANT
SUT VIC AB 0 CTB1 27 (SUTURE) ×2 IMPLANT
SUT VIC AB 1 CTX 36 (SUTURE) ×2
SUT VIC AB 1 CTX36XBRD ANBCTR (SUTURE) ×1 IMPLANT
SUT VIC AB 2-0 CTB1 (SUTURE) ×2 IMPLANT
SUT VIC AB 3-0 SH 27 (SUTURE) ×2
SUT VIC AB 3-0 SH 27X BRD (SUTURE) ×1 IMPLANT
SYR CONTROL 10ML LL (SYRINGE) ×2 IMPLANT
TOWEL OR 17X24 6PK STRL BLUE (TOWEL DISPOSABLE) ×2 IMPLANT
TOWEL OR 17X26 10 PK STRL BLUE (TOWEL DISPOSABLE) ×2 IMPLANT
TOWER CARTRIDGE SMART MIX (DISPOSABLE) ×1 IMPLANT
TRAY FOLEY CATH 14FR (SET/KITS/TRAYS/PACK) IMPLANT

## 2014-04-20 NOTE — OR Nursing (Signed)
Ms. [name] is non cooperative, not following instructions, not following post hip precautions and bending her left leg at the hip to 90 degrees.  I explained hip precautions, safety concerns and pain control however [name] continued to pull at medical devices, bend her hip etc.

## 2014-04-20 NOTE — Progress Notes (Signed)
Utilization review completed.  

## 2014-04-20 NOTE — Transfer of Care (Signed)
Immediate Anesthesia Transfer of Care Note  Patient: [name]  Procedure(s) Performed: Procedure(s): TOTAL HIP ARTHROPLASTY (Left)  Patient Location: PACU  Anesthesia Type:General  Level of Consciousness: awake and alert   Airway & Oxygen Therapy: Patient Spontanous Breathing and Patient connected to nasal cannula oxygen  Post-op Assessment: Report given to RN and Post -op Vital signs reviewed and stable  Post vital signs: Reviewed and stable  Last Vitals:  Filed Vitals:   04/20/14 1053  BP: 132/82  Pulse: 86  Temp: 36.4 C    Complications: No apparent anesthesia complications

## 2014-04-20 NOTE — OR Nursing (Signed)
Pt has received a total of 0.5 Dilaudid IV and Versed 3 mg IV for pain/anxiety/combativeness. Pt will not comply with posterior hip precautions. Pt will not follow instructions, pulling at wires and trying to disrobe.

## 2014-04-20 NOTE — Anesthesia Postprocedure Evaluation (Signed)
  Anesthesia Post-op Note  Patient: [name] On[place]  Procedure(s) Performed: Procedure(s): TOTAL HIP ARTHROPLASTY (Left)  Patient Location: PACU  Anesthesia Type:General  Level of Consciousness: awake, alert  and oriented  Airway and Oxygen Therapy: Patient Spontanous Breathing and Patient connected to nasal cannula oxygen  Post-op Pain: none  Post-op Assessment: Post-op Vital signs reviewed  Post-op Vital Signs: Reviewed  Last Vitals:  Filed Vitals:   04/20/14 1840  BP: 122/68  Pulse: 100  Temp: 36.8 C  Resp: 20    Complications: No apparent anesthesia complications

## 2014-04-20 NOTE — OR Nursing (Signed)
Pt transferred to room 5N 18. Report given to RN that patient has been combative, pulling at ekg leads and disrobing. Pt will not comply with hip precautions. Dr. Turner Danielsowan notified of PACU events, ordered abduction pillow, however there is none available. Family aware of PACU events and stated that this was "typical" behavior for patient. RN asked if a family member would be staying with patient overnight, the son- Felicity Pellegrinied stated they would not be.

## 2014-04-20 NOTE — OR Nursing (Signed)
Pt arrived to the pacu from the OR in a bed.  Ms. [name] was rigid, tense, in pain.  Richard [employer] CRNA gave 50 mcg Fentanyl IV.  After a very short time, Ms. [name] was tense c/o pain 10/10 again.  I gave her an additional 100 mcg of Fentanyl and she received no relief of pain or anxiety.  I notified Dr. Chaney MallingHodierne of events and he ordered Versed for anxiety and stated we could change to Dilaudid for pain if she still had pain following the Versed.  He stated we could give up to a total of 4mg  of Versed and 2mg  of Dilaudid.

## 2014-04-20 NOTE — Anesthesia Procedure Notes (Signed)
Procedure Name: Intubation Date/Time: 04/20/2014 1:42 PM Performed by: Gwenyth AllegraADAMI, Lavilla Delamora Pre-anesthesia Checklist: Emergency Drugs available, Timeout performed, Patient identified, Suction available and Patient being monitored Patient Re-evaluated:Patient Re-evaluated prior to inductionOxygen Delivery Method: Circle system utilized Preoxygenation: Pre-oxygenation with 100% oxygen Intubation Type: IV induction Ventilation: Mask ventilation without difficulty Laryngoscope Size: 3 Grade View: Grade II Tube type: Oral Tube size: 7.0 mm Number of attempts: 1 Airway Equipment and Method: Stylet Placement Confirmation: ETT inserted through vocal cords under direct vision,  breath sounds checked- equal and bilateral and positive ETCO2 Secured at: 21 cm Tube secured with: Tape Dental Injury: Teeth and Oropharynx as per pre-operative assessment and Injury to lip

## 2014-04-20 NOTE — Progress Notes (Signed)
Orthopedic Tech Progress Note Patient Details:  Deanna JulySarah A [name] 1928/08/18 [id number]  Ortho Devices Ortho Device/Splint Location: hip abduction pillow Ortho Device/Splint Interventions: Ordered, Application   Jennye MoccasinHughes, [name] Craig 04/20/2014, 7:38 PM

## 2014-04-20 NOTE — Interval H&P Note (Signed)
History and Physical Interval Note:  04/20/2014 1:14 PM  [name]  has presented today for surgery, with the diagnosis of LEFT HIP DEGENERATIVE JOINT DISEASE  The various methods of treatment have been discussed with the patient and family. After consideration of risks, benefits and other options for treatment, the patient has consented to  Procedure(s): TOTAL HIP ARTHROPLASTY (Left) as a surgical intervention .  The patient's history has been reviewed, patient examined, no change in status, stable for surgery.  I have reviewed the patient's chart and labs.  Questions were answered to the patient's satisfaction.     Nestor LewandowskyOWAN,Javed Cotto J

## 2014-04-20 NOTE — Op Note (Signed)
PATIENT ID:      [name]  MRN:     [id number] DOB/AGE:    1928/06/30 / 81 y.o.       OPERATIVE REPORT    DATE OF PROCEDURE:  04/20/2014       PREOPERATIVE DIAGNOSIS:  LEFT HIP DEGENERATIVE JOINT DISEASE                                                       Estimated body mass index is 21.26 kg/(m^2) as calculated from the following:   Height as of this encounter: 4' 11.5" (1.511 m).   Weight as of this encounter: 48.535 kg (107 lb).     POSTOPERATIVE DIAGNOSIS:  LEFT HIP DEGENERATIVE JOINT DISEASE                                                           PROCEDURE:  L total hip arthroplasty using a 52 mm DePuy Pinnacle  Cup, [employer], 10-degree polyethylene liner index superior  and posterior, a +1.5 36 mm metal head, a #3 Summit stem, cemented double batch of DePuy HV cement with 1500 mg of Zinacef, 10 mm centralizer and #3 central canal occluder   SURGEON: Morrell Fluke J    ASSISTANT:   Eric K. Gaylene Brooks  (present throughout entire procedure and necessary for timely completion of the procedure)  ANESTHESIA:  General  BLOOD LOSS: 300cc FLUID REPLACEMENT: 1500 crystalloid DRAINS: COMPLICATIONS:  None    INDICATIONS FOR PROCEDURE:Patient with end-stage arthritis of the L hip.  X-rays show bone-on-bone arthritic changes. Despite conservative measures with observation, anti-inflammatory medicine, narcotics, use of a cane, has severe unremitting pain and can ambulate only 1 blocks before resting.  Patient desires elective right total hip arthroplasty to decrease pain and increase function. The risks, benefits, and alternatives were discussed at length including but not limited to the risks of infection, bleeding, nerve injury, stiffness, blood clots, the need for revision surgery, cardiopulmonary complications, among others, and they were willing to proceed.Benefits have been discussed. Questions answered.     PROCEDURE IN DETAIL: The patient was identified by armband,   received preoperative IV antibiotics in the holding area at [facility], taken to the operating room , appropriate anesthetic monitors  were attached and general endotracheal anesthesia induced. Foley catheter was inserted. Patient was rolled into the R lateral decubitus position and fixed there with a Stulberg Mark II pelvic clamp and the L lower extremity was then prepped and draped  in the usual sterile fashion from the ankle to the hemipelvis. A time-out  procedure was performed. The skin along the lateral hip and thigh  infiltrated with 10 mL of 0.5% Marcaine and epinephrine solution. We  then made a posterolateral approach to the hip. With a #10 blade, 14 cm  incision through skin and subcutaneous tissue down to the level of the  IT band. Small bleeders were identified and cauterized. IT band cut in  line with skin incision exposing the greater trochanter. A Cobra retractor was placed between the gluteus minimus and the superior hip joint capsule, and a spiked Cobra between the  quadratus femoris and the inferior hip joint capsule. This isolated the short  external rotators and piriformis tendons. These were tagged with a #2 Ethibond  suture and cut off their insertion on the intertrochanteric crest. The posterior  capsule was then developed into an acetabular-based flap from Posterior Superior off of the acetabulum out over the femoral neck and back posterior inferior to the acetabular rim. This flap was tagged with two #2 Ethibond sutures and retracted protecting the sciatic nerve. This exposed the arthritic femoral head and osteophytes. The hip was then flexed and internally rotated, dislocating the femoral head and a standard neck cut performed 1 fingerbreadth above the lesser trochanter.  A spiked Cobra was placed in the cotyloid notch and a Hohmann retractor was then used to lever the femur anteriorly off of the anterior pelvic column. A posterior-inferior wing retractor was placed  at the junction of the acetabulum and the ischium completing the acetabular exposure.We then removed the peripheral osteophytes and labrum from the acetabulum. We then reamed the acetabulum up to 51 mm with basket reamers obtaining good coverage in all quadrants, irrigated out with normal  saline solution and hammered into place a 52 mm pinnacle cup in 45  degrees of abduction and about 20 degrees of anteversion. More  peripheral osteophytes removed and a trial 10-degree liner placed with the  index superior-posterior. The hip was then flexed and internally rotated exposing the  proximal femur, which was entered with the initiating reamer followed by  the tapered reamers up to a 2-3 tapered reamer and broaching up to a #2 broach, which had good fit and fill. A trial reduction was then  performed with a +1.5  36-mm ball on the standard neck and  excellent stability was noted with at 90 of flexion with 60 of  internal rotation and then full extension withexternal rotation. The hip  could not be dislocated in full extension. The knee could easily flex  to about 140 degrees. We also stretched the abductors at this point,  because of the preexisting adductor contractures. All trial components  were then removed. The acetabulum was irrigated out with normal saline  solution. A titanium [facility] Eliminator was then screwed into place  followed by a 10-degree polyethylene liner index superior-posterior. On  the femoral side, we then sized for a #3 cement restrictor and irrigated  out the femoral canal with normal saline solution and dried with suction  and sponges. The cement restrictor was placed to the appropriate depth. A  double batch of DePuy 1 cement with 1500 mg of Zinacef was mixed and  injected into the canal under pressure followed by #2 Summit stem  in 20 degrees of anteversion and as this was held in place, excess cement  was removed. At this point, a +0 36-mm metal head was  hammered  on the stem. The hip was reduced. We checked our stability  one more time and found to be excellent. The wound was once again  thoroughly irrigated out with normal saline solution pulse lavage. The  capsular flap and short external rotators were repaired back to the  intertrochanteric crest through drill holes with a #2 Ethibond suture.  The IT band was closed with running 1 Vicryl suture. The subcutaneous  tissue with 0 and 2-0 undyed Vicryl suture and the skin with running  interlocking 3-0 nylon suture. Dressing of Xeroform and Mepilex was  then applied. The patient was then unclamped, rolled supine, awaken extubated and taken to  recovery room without difficulty in stable condition.   Gean Birchwood J 04/20/2014, 2:57 PM

## 2014-04-20 NOTE — Progress Notes (Signed)
Spoke with Dr Berneice HeinrichManny related to CXR. CXR was ordered by Dr Wadie Lessenowan's office, but had CXR Jan of this year. Repeat CXR per Dr Berneice HeinrichManny.

## 2014-04-21 ENCOUNTER — Encounter (HOSPITAL_COMMUNITY): Payer: Self-pay | Admitting: Orthopedic Surgery

## 2014-04-21 LAB — BASIC METABOLIC PANEL
Anion gap: 4 — ABNORMAL LOW (ref 5–15)
BUN: 11 mg/dL (ref 6–23)
CO2: 31 mmol/L (ref 19–32)
Calcium: 8.7 mg/dL (ref 8.4–10.5)
Chloride: 102 mmol/L (ref 96–112)
Creatinine, Ser: 1.11 mg/dL — ABNORMAL HIGH (ref 0.50–1.10)
GFR calc Af Amer: 52 mL/min — ABNORMAL LOW (ref >90)
GFR, EST NON AFRICAN AMERICAN: 45 mL/min — AB (ref >90)
Glucose, Bld: 149 mg/dL — ABNORMAL HIGH (ref 70–99)
POTASSIUM: 4 mmol/L (ref 3.5–5.1)
SODIUM: 137 mmol/L (ref 135–145)

## 2014-04-21 LAB — CBC
HCT: 31.8 % — ABNORMAL LOW (ref 36.0–46.0)
Hemoglobin: 10.4 g/dL — ABNORMAL LOW (ref 12.0–15.0)
MCH: 29.5 pg (ref 26.0–34.0)
MCHC: 32.7 g/dL (ref 30.0–36.0)
MCV: 90.1 fL (ref 78.0–100.0)
PLATELETS: 243 10*3/uL (ref 150–400)
RBC: 3.53 MIL/uL — ABNORMAL LOW (ref 3.87–5.11)
RDW: 14.5 % (ref 11.5–15.5)
WBC: 12.1 10*3/uL — AB (ref 4.0–10.5)

## 2014-04-21 NOTE — Evaluation (Deleted)
Physical Therapy Evaluation Patient Details Name: [name] MRN: [id number] DOB: 1929/02/19 Today's Date: 04/21/2014   History of Present Illness  Pt. admitted 04/20/14, underwent L THA (posteriolateral approach).  Pt. has had episodes of confusion and combativeness today per RN.  Pt,. with h/o COPD and R THA  Clinical Impression  Pt. Is s/p left THA, posterior approach.  She is having agitation and is unable to tolerate coming to sit at EOB today with PT.  Also of note, pt. Has increased left hip adduction and knee flexion tendency, making it difficult to apply hip abduction pillow and requiring a second person for application.  Pt. With residual knee flexion even with best achievable position.  Discussed with pt's RN, Briant Sites and she has been in to see pt. Pt. Will need acute PT to advance pt. As she can tolerate.  She will clearly need SNF lev3el rehab once DC'd from acute care.      Follow Up Recommendations      Equipment Recommendations       Recommendations for Other Services       Precautions / Restrictions Precautions Precautions: Posterior Hip Precaution Booklet Issued: Yes (comment) Precaution Comments: No family members present, pt. unable to receive teahcniag due to her agitation and mental status Required Braces or Orthoses:  (hip abduction pillow ) Restrictions Weight Bearing Restrictions: Yes LLE Weight Bearing: Weight bearing as tolerated Other Position/Activity Restrictions: Pt. has orders for hip abduction pillow.  Upon my entry into room, pt. noted to have LEs close together with left hip adducted.  Attempted to get abduction pillow in place , requiring assist of 2.  Pt. with left knee flexed and hip adducted, making it difficult to apply abduction pillow.  Pt. with alot of "resistance" to gentle passive motion for positioning pillow.  I discussed situation with Briant Sites, RN.  She came into room to visualize pillow positionl      Mobility  Bed  Mobility Overal bed mobility: Needs Assistance;+2 for physical assistance Bed Mobility: Supine to Sit;Sit to Supine     Supine to sit: Max assist;+2 for physical assistance Sit to supine: Max assist;+2 for physical assistance   General bed mobility comments: Pt. resistant to attempt to move supine to sitting at EOB; only able to make it half way with increasing resistance from pt. and increased in her agitation level.  I returned tp. to supine position.  Needed +2 max assist to scoot her to [facility] with bed pad.  Attempted at this point to apply abduction pillow.  Pt. with some residual left knee flexion even i in abd. pillow (~45 degrees).  LEs were left in abducted position, straps were applied for comfort and to maintain position  Transfers Overall transfer level:  (pt. unable to tolerate)                  Ambulation/Gait                Stairs            Wheelchair Mobility    Modified Rankin (Stroke Patients Only)       Balance                                             Pertinent Vitals/Pain Pain Assessment: Faces Faces Pain Scale: Hurts a little bit Pain Location: Faces 2  at rest while undisturbed, 6 with attempt to sit pt. on EOB  and to apply abduction pillow Pain Intervention(s): Limited activity within patient's tolerance;Repositioned;Relaxation (RN made aware)    Home Living Family/patient expects to be discharged to:: Unsure (pt. cannot state and no family available)                 Additional Comments: pt. unable to accurately report, no family present    Prior Function Level of Independence:  (TBD)               Hand Dominance        Extremity/Trunk Assessment   Upper Extremity Assessment: Defer to OT evaluation           Lower Extremity Assessment: RLE deficits/detail;LLE deficits/detail RLE Deficits / Details: difficult to assess due to pt. lethargy then agitation.  Grossly appears WFL to bed  mobility LLE Deficits / Details: difficult to assess due to lethargy then agitation; pt. preferring to keep left knee flexed and hip adducted.  Appears to be "resistant" to gentle ROM to apply abduction pillow     Communication   Communication: No difficulties  Cognition Arousal/Alertness: Lethargic;Suspect due to medications Behavior During Therapy: Agitated Overall Cognitive Status: No family/caregiver present to determine baseline cognitive functioning       Memory: Decreased recall of precautions;Decreased short-term memory              General Comments      Exercises        Assessment/Plan    PT Assessment    PT Diagnosis     PT Problem List    PT Treatment Interventions     PT Goals (Current goals can be found in the Care Plan section)      Frequency     Barriers to discharge        Co-evaluation               End of Session                 Time:  -      Charges:         PT G Codes:        Ferman HammingBlankenship, Gordie Crumby B 04/21/2014, 4:18 PM Weldon PickingSusan Daneli Butkiewicz PT Acute Rehab Services [phone] Beeper [phone]

## 2014-04-21 NOTE — Plan of Care (Signed)
Problem: Consults Goal: Diagnosis- Total Joint Replacement Outcome: Completed/Met Date Met:  04/21/14 Primary Total Hip Left; Posterior

## 2014-04-21 NOTE — Progress Notes (Signed)
OT Cancellation Note  Patient Details Name: Deanna JulySarah A [name] MRN: [id number] DOB: 02/11/1929   Cancelled Treatment:    Reason Eval/Treat Not Completed: Other (comment) Pt is Medicare and current D/C plan is SNF. No apparent immediate acute care OT needs, therefore will defer OT to SNF. If OT eval is needed please call Acute Rehab Dept. at [phone] or text page OT at [phone].    Nena JordanMiller, Elizah Mierzwa M   Carney LivingLeeAnn Marie Decker Cogdell, OTR/L Occupational Therapist [phone] (pager)  04/21/2014, 3:53 PM

## 2014-04-21 NOTE — Progress Notes (Addendum)
Pt refused the abduction pillow.  Pt very impulsive and combative. She tried to get out of bed. Pt educated on posterior hip precautions. Pt refused to stay in bed and wanted to get in the chair. Pt given 0.5mg  of Ativan. Pt OOB to chair with 3 assist and chair alarm. Will continue to monitor patient.

## 2014-04-21 NOTE — Clinical Social Work Note (Signed)
CSW met with patient's son Jearlene Bridwell) and patient's daughter-in-law Makynzee Tigges) at bedside to confirm patient's discharge disposition. Patient's daughter-in-law confirmed patient will be discharged to [facility] at time of discharge. Patient's daughter-in-law reported family will be present at patient's bedside at time of discharge.  CSW to follow and contact patient's daughter-in-law once patient medically stable for discharge.  Clare Gandy and Davyn Elsasser [phone]).  Lubertha Sayres, [place] ([phone]) Licensed Clinical Social Worker Orthopedics [phone]) and Surgical [phone])

## 2014-04-21 NOTE — Progress Notes (Signed)
Patient ID: [name], female   DOB: 1928-11-23, 81 y.o.   MRN: [id number] PATIENT ID: [name]  MRN: [id number]  DOB/AGE:  1928-11-23 / 81 y.o.  1 Day Post-Op Procedure(s) (LRB): TOTAL HIP ARTHROPLASTY (Left)    PROGRESS NOTE Subjective: Patient is alert, oriented, no Nausea, no Vomiting, yes passing gas, no Bowel Movement. Taking PO well. Moderate anxiety. Denies SOB, Chest or Calf Pain. Using Incentive Spirometer, PAS in place. Ambulate WBAT, reinforced post precautions Patient reports pain as 4 on 0-10 scale  .    Objective: Vital signs in last 24 hours: Filed Vitals:   04/20/14 2000 04/21/14 0000 04/21/14 0400 04/21/14 0419  BP:    122/53  Pulse:    95  Temp:    98.4 F (36.9 C)  TempSrc:    Oral  Resp: 18 18 17 17   Height:      Weight:      SpO2: 100% 97% 98% 98%      Intake/Output from previous day: I/O last 3 completed shifts: In: 1255 [I.V.:1100; IV Piggyback:155] Out: 450 [Urine:450]   Intake/Output this shift:     LABORATORY DATA: No results for input(s): WBC, HGB, HCT, PLT, NA, K, CL, CO2, BUN, CREATININE, GLUCOSE, GLUCAP, INR, CALCIUM in the last 72 hours.  Invalid input(s): PT, 2  Examination: Neurologically intact ABD soft Neurovascular intact Sensation intact distally Intact pulses distally Dorsiflexion/Plantar flexion intact Incision: dressing C/D/I No cellulitis present Compartment soft} XR AP&Lat of hip shows well placed\fixed THA  Assessment:   1 Day Post-Op Procedure(s) (LRB): TOTAL HIP ARTHROPLASTY (Left) ADDITIONAL DIAGNOSIS:  Expected Acute Blood Loss Anemia, Hypertension  Plan: PT/OT WBAT, THA  posterior precautions  DVT Prophylaxis: SCDx72 hrs, ASA 325 mg BID x 2 weeks  DISCHARGE PLAN: Skilled Nursing Facility/Rehab, [facility]  DISCHARGE NEEDS: HHPT, Walker and 3-in-1 comode seat

## 2014-04-21 NOTE — Clinical Social Work Psychosocial (Signed)
Clinical Social Work Department BRIEF PSYCHOSOCIAL ASSESSMENT 04/21/2014  Patient:  [name], [name]     Account Number:  [id number]     Admit date:  04/20/2014  Clinical Social Worker:  Durward Fortes, CLINICAL SOCIAL WORKER  Date/Time:  04/21/2014 11:39 AM  Referred by:  Physician  Date Referred:  04/21/2014 Referred for  SNF Placement   Other Referral:   none.   Interview type:  Patient Other interview type:   none.    PSYCHOSOCIAL DATA Living Status:  ALONE Admitted from facility:   Level of care:   Primary support name:  Defne Gerling Primary support relationship to patient:  CHILD, ADULT Degree of support available:   Adequate support.    CURRENT CONCERNS Current Concerns  Post-Acute Placement   Other Concerns:   none.    SOCIAL WORK ASSESSMENT / PLAN CSW and BSW-Intern consulted regarding possible SNF placement for pt once medically stable for discharge.    BSW-Intern met with pt at bedside upon receiving report of pt wanting to further therapy at [facility] once medically stable for discharge. Pt confirmed at bedside that pt would like to further therapy at [facility] once medically stable discharge. Pt expressed no stressors at this time as it relates to discharge upon being medically stable.    CSW to follow up with pt's son on 04/22/2014 regarding an update for pt care.    CSW and BSW-Intern will continue to assist with discharge planning needs.   Assessment/plan status:  Psychosocial Support/Ongoing Assessment of Needs Other assessment/ plan:   none.   Information/referral to community resources:   Pt to be discharged to [facility] once medically stable for discharge.    PATIENT'S/FAMILY'S RESPONSE TO PLAN OF CARE: Pt agreeable to CSW plan of care. Pt expressed no further questions or concerns at this time.       Virgie Dad Annalynne Ibanez, BSW-Intern

## 2014-04-21 NOTE — Evaluation (Signed)
Physical Therapy Evaluation Patient Details Name: [name] MRN: [id number] DOB: [birth year] Today's Date: 04/21/2014   History of Present Illness  Pt. admitted 04/20/14, underwent L THA (posteriolateral approach).  Pt. has had episodes of confusion and combativeness today per RN.  Pt,. with h/o COPD and R THA  Clinical Impression  Pt. Underwent L THA and is now with agitation and combativeness, unable to tolerate even sitting on edge of bed with PT.  She will benefit from acute PT to address her post op mobility issues as her cognition normalizes.  She has increased left hip adduction with left knee flexion.  It is important to be able to use hip abduction pillow at the present time but was difficult to get good positioning due to pt's resistance.  Discussed with nursing tech [place] and with RN Nettie Elm.    Follow Up Recommendations SNF;Supervision/Assistance - 24 hour;Supervision for mobility/OOB    Equipment Recommendations  Other (comment) (tbd)    Recommendations for Other Services       Precautions / Restrictions Precautions Precautions: Posterior Hip Precaution Booklet Issued: Yes (comment) Precaution Comments: No family members present, pt. unable to receive teahcniag due to her agitation and mental status Required Braces or Orthoses:  (hip abduction pillow ) Restrictions Weight Bearing Restrictions: Yes LLE Weight Bearing: Weight bearing as tolerated Other Position/Activity Restrictions: Pt. has orders for hip abduction pillow.  Upon my entry into room, pt. noted to have LEs close together with left hip adducted.  Attempted to get abduction pillow in place , requiring assist of 2.  Pt. with left knee flexed and hip adducted, making it difficult to apply abduction pillow.  Pt. with alot of "resistance" to gentle passive motion for positioning pillow.  I discussed situation with Briant Sites, RN.  She came into room to visualize pillow positionl      Mobility  Bed  Mobility Overal bed mobility: Needs Assistance;+2 for physical assistance Bed Mobility: Supine to Sit;Sit to Supine     Supine to sit: Max assist;+2 for physical assistance Sit to supine: Max assist;+2 for physical assistance   General bed mobility comments: Pt. resistant to attempt to move supine to sitting at EOB; only able to make it half way with increasing resistance from pt. and increased in her agitation level.  I returned tp. to supine position.  Needed +2 max assist to scoot her to [facility] with bed pad.  Attempted at this point to apply abduction pillow.  Pt. with some residual left knee flexion even i in abd. pillow (~45 degrees).  LEs were left in abducted position, straps were applied for comfort and to maintain position  Transfers Overall transfer level:  (pt. unable to tolerate)                  Ambulation/Gait                Stairs            Wheelchair Mobility    Modified Rankin (Stroke Patients Only)       Balance Overall balance assessment: Needs assistance Sitting-balance support: Bilateral upper extremity supported;Feet supported Sitting balance-Leahy Scale: Zero Sitting balance - Comments: pt. could not tolerate sitting at EOB                                      Pertinent Vitals/Pain Pain Assessment: Faces Faces Pain Scale: Hurts a  little bit Pain Location: Faces 2 at rest while undisturbed, 6 with attempt to sit pt. on EOB  and to apply abduction pillow Pain Intervention(s): Limited activity within patient's tolerance;Repositioned;Relaxation (RN made aware)    Home Living Family/patient expects to be discharged to:: Skilled nursing facility (pt. cannot state and no family available)                 Additional Comments: pt. unable to accurately report, no family present    Prior Function Level of Independence:  (TBD)               Hand Dominance        Extremity/Trunk Assessment   Upper Extremity  Assessment: Defer to OT evaluation           Lower Extremity Assessment: RLE deficits/detail;LLE deficits/detail RLE Deficits / Details: difficult to assess due to pt. lethargy then agitation.  Grossly appears WFL to bed mobility LLE Deficits / Details: difficult to assess due to lethargy then agitation; pt. preferring to keep left knee flexed and hip adducted.  Appears to be "resistant" to gentle ROM to apply abduction pillow     Communication   Communication: No difficulties  Cognition Arousal/Alertness: Lethargic;Suspect due to medications Behavior During Therapy: Agitated Overall Cognitive Status: No family/caregiver present to determine baseline cognitive functioning       Memory: Decreased recall of precautions;Decreased short-term memory              General Comments      Exercises        Assessment/Plan    PT Assessment Patient needs continued PT services  PT Diagnosis Difficulty walking;Acute pain;Altered mental status   PT Problem List Decreased strength;Decreased activity tolerance;Decreased balance;Decreased mobility;Decreased cognition;Decreased knowledge of use of DME;Decreased safety awareness;Decreased knowledge of precautions;Pain  PT Treatment Interventions DME instruction;Gait training;Therapeutic activities;Functional mobility training;Therapeutic exercise;Balance training;Patient/family education   PT Goals (Current goals can be found in the Care Plan section) Acute Rehab PT Goals Patient Stated Goal: pt. could not state her goals PT Goal Formulation: Patient unable to participate in goal setting Time For Goal Achievement: 04/28/14 Potential to Achieve Goals: Fair    Frequency 7X/week   Barriers to discharge        Co-evaluation               End of Session Equipment Utilized During Treatment: Other (comment) Activity Tolerance: Patient limited by pain;Treatment limited secondary to agitation Patient left: in bed;with call  bell/phone within reach;with nursing/sitter in room;with bed alarm set Nurse Communication: Mobility status;Other (comment);Precautions         Time: 1531-1551 PT Time Calculation (min) (ACUTE ONLY): 20 min   Charges:   PT Evaluation $Initial PT Evaluation Tier I: 1 Procedure     PT G CodesFerman [name]:        [name] 04/21/2014, 4:28 PM Weldon PickingSusan Yarden [name] PT Acute Rehab Services [phone] Beeper [phone]

## 2014-04-21 NOTE — Clinical Social Work Placement (Signed)
Clinical Social Work Department CLINICAL SOCIAL WORK PLACEMENT NOTE 04/21/2014  Patient:  [name],[name]  Account Number:  [id number] Admit date:  04/20/2014  Clinical Social Worker:  Hortencia PilarKIERRA Caliana Spires, CLINICAL SOCIAL WORKER  Date/time:  04/21/2014 11:58 AM  Clinical Social Work is seeking post-discharge placement for this patient at the following level of care:   SKILLED NURSING   (*CSW will update this form in Epic as items are completed)   04/21/2014  [facility]family provided with [facility] Department of Clinical Social Work's list of facilities offering this level of care within the geographic area requested by the patient (or if unable, by the patient's family).  04/21/2014  Patient/family informed of their freedom to choose among providers that offer the needed level of care, that participate in Medicare, Medicaid or managed care program needed by the patient, have an available bed and are willing to accept the patient.  04/21/2014  Patient/family informed of [facility]' ownership interest in [facility], as well as of the fact that they are under no obligation to receive care at this facility.  PASARR submitted to EDS on 04/21/2014 PASARR number received on 04/21/2014  FL2 transmitted to all facilities in geographic area requested by pt/family on  04/21/2014 FL2 transmitted to all facilities within larger geographic area on   Patient informed that his/her managed care company has contracts with or will negotiate with  certain facilities, including the following:     Patient/family informed of bed offers received:  04/21/2014 [facility] Physician recommends and patient chooses bed at    [facility]   Patient to be transferred to facility by [facility] Patient and family notified of transfer on  Name of family member notified:    The following physician request were entered in Epic:   Additional  Comments:   Augustus Zurawski S. Bernedette Auston, BSW-Intern

## 2014-04-22 LAB — CBC
HCT: 28.9 % — ABNORMAL LOW (ref 36.0–46.0)
HEMOGLOBIN: 9.3 g/dL — AB (ref 12.0–15.0)
MCH: 28.9 pg (ref 26.0–34.0)
MCHC: 32.2 g/dL (ref 30.0–36.0)
MCV: 89.8 fL (ref 78.0–100.0)
PLATELETS: 200 10*3/uL (ref 150–400)
RBC: 3.22 MIL/uL — AB (ref 3.87–5.11)
RDW: 14.5 % (ref 11.5–15.5)
WBC: 10.1 10*3/uL (ref 4.0–10.5)

## 2014-04-22 MED ORDER — FAMOTIDINE 20 MG PO TABS
20.0000 mg | ORAL_TABLET | Freq: Every day | ORAL | Status: DC
  Administered 2014-04-22 – 2014-04-25 (×4): 20 mg via ORAL
  Filled 2014-04-22 (×4): qty 1

## 2014-04-22 NOTE — Progress Notes (Signed)
PATIENT ID: [name]  MRN: [id number]  DOB/AGE:  Mar 25, 1928 / 81 y.o.  2 Days Post-Op Procedure(s) (LRB): TOTAL HIP ARTHROPLASTY (Left)    PROGRESS NOTE Subjective: Patient is alert, oriented, no Nausea, no Vomiting, yes passing gas, no Bowel Movement. Taking PO small bites. Denies SOB, Chest or Calf Pain. Using Incentive Spirometer, PAS in place. Ambulate WBAT with therapy. Patient reports pain as mild and moderate  .    Objective: Vital signs in last 24 hours: Filed Vitals:   04/21/14 1944 04/22/14 0152 04/22/14 0500 04/22/14 0654  BP: 99/65   94/45  Pulse: 98 112  92  Temp: 98.1 F (36.7 C)   98.2 F (36.8 C)  TempSrc: Oral   Oral  Resp:  18 18 17   Height:      Weight:      SpO2:  100%  93%      Intake/Output from previous day: I/O last 3 completed shifts: In: 240 [P.O.:240] Out: 1700 [Urine:1700]   Intake/Output this shift: Total I/O In: 240 [P.O.:240] Out: -    LABORATORY DATA:  Recent Labs  04/21/14 0707 04/22/14 0649  WBC 12.1* 10.1  HGB 10.4* 9.3*  HCT 31.8* 28.9*  PLT 243 200  NA 137  --   K 4.0  --   CL 102  --   CO2 31  --   BUN 11  --   CREATININE 1.11*  --   GLUCOSE 149*  --   CALCIUM 8.7  --     Examination: Neurologically intact Neurovascular intact Sensation intact distally Intact pulses distally Dorsiflexion/Plantar flexion intact Incision: dressing C/D/I and no drainage No cellulitis present Compartment soft} XR AP&Lat of hip shows well placed\fixed THA  Assessment:   2 Days Post-Op Procedure(s) (LRB): TOTAL HIP ARTHROPLASTY (Left) ADDITIONAL DIAGNOSIS:  Expected Acute Blood Loss Anemia, Hypertension  Plan: PT/OT WBAT, THA  posterior precautions  DVT Prophylaxis: SCDx72 hrs, ASA 325 mg BID x 2 weeks  DISCHARGE PLAN: Skilled Nursing Facility/Rehab, [facility] when pt passes therapy goals.  DISCHARGE NEEDS: HHPT, HHRN, Walker and 3-in-1 comode seat

## 2014-04-22 NOTE — Progress Notes (Signed)
Patient found by nurse to be OOB with SCDs trying to leave room. Patient had taken nasal cannula off. Patient desatted to 78% RA. Patient hands noted to be cold, used heat pack to warm hands and reattached nasal cannula. Patient satted at 100% on 2L/[place]. Patient was noted to panic because she had soiled herself and had pain. Patient was alert and oriented X2. Patient received pain medication (Dilaudid 1 mg) Patient noted to return to sleep. Will continue to monitor accordingly.

## 2014-04-22 NOTE — Clinical Social Work Note (Signed)
CSW received report patient to potentially be discharged on Monday (04/25/2014). Patient with active Recruitment consultantsafety sitter. CSW updated [facility] regarding change in discharge date. CSW to continue to follow and assist with discharge needs.  Discharge barrier: Safety sitter to be discontinued 24 hours prior to anticipated discharge.  Marcelline Deistmily Dante Roudebush, LCSWA [phone]) Licensed Clinical Social Worker Orthopedics [phone]([id number]) and Surgical [phone]([id number])

## 2014-04-22 NOTE — Progress Notes (Signed)
Physical Therapy Treatment Patient Details Name: [name] MRN: [id number] DOB: 08/04/1928 Today's Date: 04/22/2014    History of Present Illness Pt. admitted 04/20/14, underwent L THA (posteriolateral approach).  Pt. has had episodes of confusion and combativeness today per RN.  Pt,. with h/o COPD and R THA.  04/22/14, confusion/agitation continues    PT Comments    Pt. Still with agitation today though at time of therapy session, not as bad as yesterday.  She was actually agreeable to "taking a little walk".  Fatigues easily, needs manual and verbal cues for foot placement and for swing phase.  Pt. With apparent escalation of her heart rate with limited activity of walking 8' x 2 (high 160s with one brief reading of 195 .  I am not convinced of the reliability of this HR as wave form was suboptimal. I attempted to check HR manually but was faint and I could not get a better read on another pulse ox).  Nonetheless, pt. Assisted back to bed and HR appeared reliably in the high 100s (109).  Pt. In no distress.  I made Nettie ElmSylvia, RN aware and PTwill continue to closely monitor with activity.  Pt. Was insistent that the abduction pillow not be placed but she was agreeable to allow a bed pillow between legs.  She does not have the cognition at this point to self monitor her hip precautions but became quite agitated when I attempted to place abduction pillow.  Nursing tech in room when I left.   In summary, she was able to walk a short distance with +2 assist but is still hindered by her mental status/agitation and apparent tachycardia.  Follow Up Recommendations  SNF;Supervision/Assistance - 24 hour;Supervision for mobility/OOB     Equipment Recommendations  Other (comment) (TBD)    Recommendations for Other Services       Precautions / Restrictions Precautions Precautions: Posterior Hip Precaution Comments: No family members present, pt. unable to receive teahcniag due to her agitation and  mental status Restrictions Weight Bearing Restrictions: Yes LLE Weight Bearing: Weight bearing as tolerated Other Position/Activity Restrictions: Pt. with abduction pillow in place upon my entry into room, but with her left knee flexed ~45 degrees and resistance against the pillow    Mobility  Bed Mobility Overal bed mobility: Needs Assistance;+2 for physical assistance Bed Mobility: Supine to Sit;Sit to Supine     Supine to sit: Mod assist;+2 for physical assistance Sit to supine: Mod assist;+2 for physical assistance   General bed mobility comments: Pt. calamed when given directions and choices.  She tolerated coming to edge of bed with +2 mod assist, and to returning to supine with +2 mod assist.  Step by step directions given with increased time to process.    Transfers Overall transfer level: Needs assistance Equipment used: Rolling walker (2 wheeled) Transfers: Sit to/from Stand Sit to Stand: Mod assist;+2 physical assistance         General transfer comment: +2 mod assist to rise to stand and to assist with sitting down.  Pt. needing step by step cueing and direction with extra time for processing  Ambulation/Gait Ambulation/Gait assistance: Min assist;+2 physical assistance Ambulation Distance (Feet): 15 Feet Assistive device: Rolling walker (2 wheeled) Gait Pattern/deviations: Step-to pattern;Decreased step length - right;Decreased step length - left;Narrow base of support Gait velocity: decreased   General Gait Details: Pt. keeps left foot inverted and has narrow base of support.  Needs verbal and manual assist to assist in better positioning of  her feet to observe hip precautions.  Pt. able to walk 8' x 2 with standing rest.  No distress noted, however pt. became tired.  Upon return to sitting on EOB and ckecking her O2 sats and HR, pt. noted to have tachycardia in the hight 160s and at one point, 195 was noted.  Attempted to takd HR manually to corroberate findings of  pulse ox, however pulse was shallow and could not get reliable read.  RN Nettie Elm made aware.   Stairs            Wheelchair Mobility    Modified Rankin (Stroke Patients Only)       Balance Overall balance assessment: Needs assistance Sitting-balance support: Bilateral upper extremity supported;Feet supported Sitting balance-Leahy Scale: Poor Sitting balance - Comments: Pt. able to sit EOB with close supervision and bilateral hand support   Standing balance support: Bilateral upper extremity supported;During functional activity Standing balance-Leahy Scale: Poor Standing balance comment: needed use of RW for support                    Cognition Arousal/Alertness: Awake/alert Behavior During Therapy: Restless;Anxious;Agitated (somewhat less agitated than I saw yesterday) Overall Cognitive Status: No family/caregiver present to determine baseline cognitive functioning       Memory: Decreased recall of precautions;Decreased short-term memory              Exercises      General Comments        Pertinent Vitals/Pain Pain Assessment: Faces Faces Pain Scale: Hurts little more Pain Location: left hip Pain Intervention(s): Limited activity within patient's tolerance;Monitored during session;Repositioned;Relaxation    Home Living                      Prior Function            PT Goals (current goals can now be found in the care plan section) Progress towards PT goals: Progressing toward goals    Frequency  7X/week    PT Plan Current plan remains appropriate    Co-evaluation             End of Session Equipment Utilized During Treatment: Gait belt Activity Tolerance: Treatment limited secondary to agitation;Patient limited by fatigue;Other (comment) (apparent tachycardia) Patient left: in bed;with call bell/phone within reach;with nursing/sitter in room;with bed alarm set     Time: 1610-9604 PT Time Calculation (min) (ACUTE  ONLY): 27 min  Charges:  $Gait Training: 8-22 mins $Therapeutic Activity: 8-22 mins                    G Codes:      Ferman Hamming 04/22/2014, 1:44 PM Weldon Picking PT Acute Rehab Services [phone] Beeper [phone]

## 2014-04-23 LAB — CBC
HCT: 26.4 % — ABNORMAL LOW (ref 36.0–46.0)
Hemoglobin: 8.7 g/dL — ABNORMAL LOW (ref 12.0–15.0)
MCH: 29.2 pg (ref 26.0–34.0)
MCHC: 33 g/dL (ref 30.0–36.0)
MCV: 88.6 fL (ref 78.0–100.0)
PLATELETS: 172 10*3/uL (ref 150–400)
RBC: 2.98 MIL/uL — ABNORMAL LOW (ref 3.87–5.11)
RDW: 14.3 % (ref 11.5–15.5)
WBC: 7.3 10*3/uL (ref 4.0–10.5)

## 2014-04-23 NOTE — Progress Notes (Signed)
Physical Therapy Treatment Patient Details Name: Orvan JulySarah A Snethen MRN: [id number] DOB: 1928-11-10 Today's Date: 04/23/2014    History of Present Illness      PT Comments    Pt with decreased confusion and agitation today allowing for better participation in PT session.   Follow Up Recommendations  Supervision/Assistance - 24 hour;SNF     Equipment Recommendations       Recommendations for Other Services       Precautions / Restrictions Precautions Precautions: Posterior Hip Precaution Comments: Reviewed 3/3 hip precautions. Restrictions LLE Weight Bearing: Weight bearing as tolerated Other Position/Activity Restrictions: Pt prefers pillow between her knees instead of the yellow abduction wedge.    Mobility  Bed Mobility         Supine to sit: Mod assist     General bed mobility comments: verbal/tactile cues for sequencing and precautions  Transfers   Equipment used: Rolling walker (2 wheeled)   Sit to Stand: Mod assist         General transfer comment: verbal cues for hand placement and sequencing, assist to power up  Ambulation/Gait Ambulation/Gait assistance: Min assist;+2 safety/equipment Ambulation Distance (Feet): 65 Feet Assistive device: Rolling walker (2 wheeled) Gait Pattern/deviations: Step-to pattern;Antalgic;Trunk flexed;Decreased stride length;Narrow base of support Gait velocity: decreased       Stairs            Wheelchair Mobility    Modified Rankin (Stroke Patients Only)       Balance                                    Cognition Arousal/Alertness: Awake/alert Behavior During Therapy: Agitated (mildly) Overall Cognitive Status: Within Functional Limits for tasks assessed       Memory: Decreased recall of precautions;Decreased short-term memory              Exercises Total Joint Exercises Ankle Circles/Pumps: AROM;Both;10 reps Quad Sets: AROM;Both;10 reps Heel Slides: AAROM;Left;5 reps     General Comments        Pertinent Vitals/Pain Pain Assessment: 0-10 Pain Score: 4  Pain Location: L hip Pain Intervention(s): Monitored during session;Limited activity within patient's tolerance;Repositioned    Home Living                      Prior Function            PT Goals (current goals can now be found in the care plan section) Progress towards PT goals: Progressing toward goals    Frequency  7X/week    PT Plan Current plan remains appropriate    Co-evaluation             End of Session Equipment Utilized During Treatment: Gait belt Activity Tolerance: Patient limited by fatigue;Patient limited by pain Patient left: in chair;with family/visitor present;with call bell/phone within reach;with chair alarm set     Time: 0935-1001 PT Time Calculation (min) (ACUTE ONLY): 26 min  Charges:  $Gait Training: 8-22 mins $Therapeutic Exercise: 8-22 mins                    G Codes:      Ilda FoilGarrow, Kailia Starry Rene 04/23/2014, 11:47 AM

## 2014-04-23 NOTE — Progress Notes (Signed)
The patient's bed alarm was ringing. Upon entering the room, this nurse noted the patient sitting on the side of the bed with assistance from her son. Son stated "she wanted to sit on the side of the bed. You know mom does what she wants to do". Son is aware of the reason the bed alarm is placed on the bed and the risks of the patient sitting on the side of the bed. This nurse turned the bed alarm off. Son is at the bedside near his mother. NT Geraldine ContrasDee was also present in the room.

## 2014-04-23 NOTE — Clinical Social Work Note (Signed)
CSW spoke with patient's RN Unknown Jimarance, who reports patient continues to have sitter. RN aware safety sitter must be discontinued 24 hours prior to anticipated d/c and states order is for sitter to be discontinued on Sunday, 2/21. CSW to follow tomorrow.  Rudolf Blizard Patrick-Jefferson, LCSWA Weekend Clinical Social Worker [phone]

## 2014-04-23 NOTE — Progress Notes (Signed)
PATIENT ID: [name]  MRN: [id number]  DOB/AGE:  Feb 25, 1929 / 81 y.o.  3 Days Post-Op Procedure(s) (LRB): TOTAL HIP ARTHROPLASTY (Left)    PROGRESS NOTE Subjective: Patient is alert, oriented, no Nausea, no Vomiting, yes passing gas, no Bowel Movement. Taking PO small bites. Denies SOB, Chest or Calf Pain. Using Incentive Spirometer, PAS in place. Ambulate WBAT with therapy Patient reports pain as 3 on 0-10 scale and 7 on 0-10 scale  .    Objective: Vital signs in last 24 hours: Filed Vitals:   04/22/14 2000 04/23/14 0000 04/23/14 0400 04/23/14 0527  BP:    101/65  Pulse:    100  Temp:    99 F (37.2 C)  TempSrc:    Oral  Resp: 18 18 18 18   Height:      Weight:      SpO2: 100% 100% 100% 98%      Intake/Output from previous day: I/O last 3 completed shifts: In: 700 [P.O.:700] Out: 1127 [Urine:1127]   Intake/Output this shift:     LABORATORY DATA:  Recent Labs  04/21/14 0707 04/22/14 0649  WBC 12.1* 10.1  HGB 10.4* 9.3*  HCT 31.8* 28.9*  PLT 243 200  NA 137  --   K 4.0  --   CL 102  --   CO2 31  --   BUN 11  --   CREATININE 1.11*  --   GLUCOSE 149*  --   CALCIUM 8.7  --     Examination: Neurologically intact Neurovascular intact Sensation intact distally Intact pulses distally Dorsiflexion/Plantar flexion intact Incision: dressing C/D/I No cellulitis present Compartment soft} XR AP&Lat of hip shows well placed\fixed THA  Assessment:   3 Days Post-Op Procedure(s) (LRB): TOTAL HIP ARTHROPLASTY (Left) ADDITIONAL DIAGNOSIS:  Expected Acute Blood Loss Anemia, Hypertension  Plan: PT/OT WBAT, THA  posterior precautions  DVT Prophylaxis: SCDx72 hrs, ASA 325 mg BID x 2 weeks  DISCHARGE PLAN: Skilled Nursing Facility/Rehab, with anticipated D/C Monday.  DISCHARGE NEEDS: HHPT, HHRN, Walker and 3-in-1 comode seat

## 2014-04-24 NOTE — Progress Notes (Signed)
Physical Therapy Treatment Patient Details Name: Orvan JulySarah A Morell MRN: [id number] DOB: 05-26-1928 Today's Date: 04/24/2014    History of Present Illness Pt. admitted 04/20/14, underwent L THA (posteriolateral approach).  Pt. has had episodes of confusion and combativeness today per RN.  Pt,. with h/o COPD and R THA.  04/22/14, confusion/agitation continues    PT Comments    Pt progressing well with mobility today.  Ambulated ~120' with RW.  Cont with POC.     Follow Up Recommendations  Supervision/Assistance - 24 hour;SNF     Equipment Recommendations       Recommendations for Other Services       Precautions / Restrictions Precautions Precautions: Posterior Hip Precaution Comments: Reviewed 3/3 hip precautions. Restrictions Weight Bearing Restrictions: Yes LLE Weight Bearing: Weight bearing as tolerated    Mobility  Bed Mobility                  Transfers Overall transfer level: Needs assistance Equipment used: Rolling walker (2 wheeled) Transfers: Sit to/from Stand Sit to Stand: Min guard         General transfer comment: cues for hand placement  Ambulation/Gait Ambulation/Gait assistance: Min guard Ambulation Distance (Feet): 120 Feet Assistive device: Rolling walker (2 wheeled) Gait Pattern/deviations: Step-to pattern;Decreased step length - right;Decreased weight shift to left Gait velocity: decreased   General Gait Details: guarding for safety.  cues for safe use of RW.  height of RW lowered with pt reporting increased comfort.     Stairs            Wheelchair Mobility    Modified Rankin (Stroke Patients Only)       Balance                                    Cognition Arousal/Alertness: Awake/alert Behavior During Therapy: WFL for tasks assessed/performed Overall Cognitive Status: Within Functional Limits for tasks assessed       Memory: Decreased recall of precautions;Decreased short-term memory               Exercises Total Joint Exercises Ankle Circles/Pumps: AROM;Both;10 reps Quad Sets: AROM;Both;10 reps Heel Slides: AAROM;Left;10 reps Long Arc Quad: AROM;Left;10 reps    General Comments        Pertinent Vitals/Pain Pain Assessment: 0-10 Pain Score: 4  Pain Location: lt hip Pain Intervention(s): Monitored during session;Premedicated before session;Repositioned    Home Living                      Prior Function            PT Goals (current goals can now be found in the care plan section) Progress towards PT goals: Progressing toward goals    Frequency  7X/week    PT Plan Current plan remains appropriate    Co-evaluation             End of Session Equipment Utilized During Treatment: Gait belt Activity Tolerance: Patient tolerated treatment well Patient left: in chair;with call bell/phone within reach     Time: [id number] PT Time Calculation (min) (ACUTE ONLY): 29 min  Charges:  $Gait Training: 8-22 mins $Therapeutic Exercise: 8-22 mins                    G Codes:      Lara MulchCooper, Codee Bloodworth Lynn 04/24/2014, 9:57 AM   Verdell FaceKelly Aryahna Spagna, PTA [phone] 04/24/2014

## 2014-04-24 NOTE — Progress Notes (Signed)
PATIENT ID: [name]  MRN: [id number]  DOB/AGE:  1928/10/19 / 81 y.o.  4 Days Post-Op Procedure(s) (LRB): TOTAL HIP ARTHROPLASTY (Left)    PROGRESS NOTE Subjective: Patient is alert, oriented, no Nausea, no Vomiting, yes passing gas, no Bowel Movement. Taking PO well. Denies SOB, Chest or Calf Pain. Using Incentive Spirometer, PAS in place. Ambulate WBAT with therapy assist.  Pt walked 65 ft yesterday.  Pt was more cooperative with nursing staff and therapy. Patient reports pain as mild  .    Objective: Vital signs in last 24 hours: Filed Vitals:   04/23/14 0900 04/23/14 2000 04/23/14 2107 04/24/14 0744  BP: 95/56 120/57 120/57 98/57  Pulse: 84  105 91  Temp:   98 F (36.7 C) 97.9 F (36.6 C)  TempSrc:   Oral Oral  Resp:   18 16  Height:      Weight:      SpO2:   98% 99%      Intake/Output from previous day: I/O last 3 completed shifts: In: 660 [P.O.:660] Out: 1327 [Urine:1327]   Intake/Output this shift:     LABORATORY DATA:  Recent Labs  04/22/14 0649 04/23/14 0605  WBC 10.1 7.3  HGB 9.3* 8.7*  HCT 28.9* 26.4*  PLT 200 172    Examination: Neurologically intact Neurovascular intact Sensation intact distally Intact pulses distally Dorsiflexion/Plantar flexion intact Incision: dressing C/D/I No cellulitis present Compartment soft} XR AP&Lat of hip shows well placed\fixed THA  Assessment:   4 Days Post-Op Procedure(s) (LRB): TOTAL HIP ARTHROPLASTY (Left) ADDITIONAL DIAGNOSIS:  Expected Acute Blood Loss Anemia, Hypertension  Plan: PT/OT WBAT, THA  posterior precautions  DVT Prophylaxis: SCDx72 hrs, ASA 325 mg BID x 2 weeks  DISCHARGE PLAN: Skilled Nursing Facility/Rehab, [facility] with anticipated D/C Monday.  DISCHARGE NEEDS: HHPT, HHRN, Walker and 3-in-1 comode seat

## 2014-04-25 NOTE — Progress Notes (Signed)
PATIENT ID: [name]  MRN: [id number]  DOB/AGE:  Sep 04, 1928 / 81 y.o.  5 Days Post-Op Procedure(s) (LRB): TOTAL HIP ARTHROPLASTY (Left)    PROGRESS NOTE Subjective: Patient is alert, oriented, no Nausea, no Vomiting, yes passing gas, no Bowel Movement. Taking PO well. Denies SOB, Chest or Calf Pain. Using Incentive Spirometer, PAS in place. Ambulate WBAT with therapy assist Patient reports pain as mild  .    Objective: Vital signs in last 24 hours: Filed Vitals:   04/24/14 1949 04/24/14 2202 04/25/14 0734 04/25/14 0736  BP: 105/58 109/58 99/53   Pulse: 77 82 79   Temp:  98.4 F (36.9 C) 98.3 F (36.8 C)   TempSrc:  Oral Oral   Resp:  16 16 18   Height:      Weight:      SpO2:  92% 93%       Intake/Output from previous day: I/O last 3 completed shifts: In: 1000 [P.O.:1000] Out: 200 [Urine:200]   Intake/Output this shift:     LABORATORY DATA:  Recent Labs  04/23/14 0605  WBC 7.3  HGB 8.7*  HCT 26.4*  PLT 172    Examination: Neurologically intact Neurovascular intact Sensation intact distally Intact pulses distally Dorsiflexion/Plantar flexion intact Incision: dressing C/D/I No cellulitis present Compartment soft} XR AP&Lat of hip shows well placed\fixed THA  Assessment:   5 Days Post-Op Procedure(s) (LRB): TOTAL HIP ARTHROPLASTY (Left) ADDITIONAL DIAGNOSIS:  Expected Acute Blood Loss Anemia, Hypertension  Plan: PT/OT WBAT, THA  posterior precautions  DVT Prophylaxis: SCDx72 hrs, ASA 325 mg BID x 2 weeks  DISCHARGE PLAN: Skilled Nursing Facility/Rehab, when bed available  DISCHARGE NEEDS: HHPT, HHRN, Walker and 3-in-1 comode seat

## 2014-04-25 NOTE — Discharge Summary (Signed)
Patient ID: [name] MRN: [id number] DOB/AGE: August 14, 1928 81 y.o.  Admit date: 04/20/2014 Discharge date: 04/25/2014  Admission Diagnoses:  Active Problems:   Arthritis, hip   Discharge Diagnoses:  Same  Past Medical History  Diagnosis Date  . Hypertension   . COPD (chronic obstructive pulmonary disease)   . Kidney disease   . Shortness of breath   . Hypothyroidism   . GERD (gastroesophageal reflux disease)   . Arthritis   . Weight loss     10-12 lbs in 2 months (03/2011)  . Pneumonia   . Anemia   . Irritable bowel syndrome   . Dry skin   . Abnormal heart rhythm     pt not sure what kind.    Surgeries: Procedure(s): TOTAL HIP ARTHROPLASTY on 04/20/2014   Consultants:    Discharged Condition: Improved  Hospital Course: JADESOLA POYNTER is an 81 y.o. female who was admitted 04/20/2014 for operative treatment of<principal problem not specified>. Patient has severe unremitting pain that affects sleep, daily activities, and work/hobbies. After pre-op clearance the patient was taken to the operating room on 04/20/2014 and underwent  Procedure(s): TOTAL HIP ARTHROPLASTY.    Patient was given perioperative antibiotics: Anti-infectives    Start     Dose/Rate Route Frequency Ordered Stop   04/20/14 1405  cefUROXime (ZINACEF) injection  Status:  Discontinued       As needed 04/20/14 1406 04/20/14 1517   04/20/14 0600  ceFAZolin (ANCEF) IVPB 2 g/50 mL premix     2 g 100 mL/hr over 30 Minutes Intravenous On call to O.R. 04/19/14 1405 04/20/14 1330       Patient was given sequential compression devices, early ambulation, and chemoprophylaxis to prevent DVT.  Patient benefited maximally from hospital stay and there were no complications.    Recent vital signs: Patient Vitals for the past 24 hrs:  BP Temp Temp src Pulse Resp SpO2  04/25/14 0736 - - - - 18 -  04/25/14 0734 (!) 99/53 mmHg 98.3 F (36.8 C) Oral 79 16 93 %  04/24/14 2202 (!) 109/58 mmHg 98.4 F (36.9 C) Oral  82 16 92 %  04/24/14 1949 (!) 105/58 mmHg - - 77 - -  04/24/14 1600 - - - - 16 96 %  04/24/14 1339 (!) 90/57 mmHg 98.4 F (36.9 C) Oral 83 16 96 %  04/24/14 1224 (!) 108/59 mmHg - - 83 - -  04/24/14 1200 - - - - 16 94 %     Recent laboratory studies:  Recent Labs  04/23/14 0605  WBC 7.3  HGB 8.7*  HCT 26.4*  PLT 172     Discharge Medications:     Medication List    STOP taking these medications        tiZANidine 2 MG tablet  Commonly known as:  ZANAFLEX  Replaced by:  tizanidine 2 MG capsule      TAKE these medications        acetaminophen 500 MG tablet  Commonly known as:  TYLENOL  Take 1,000 mg by mouth every 8 (eight) hours as needed. Take with tramadol     albuterol 108 (90 BASE) MCG/ACT inhaler  Commonly known as:  PROVENTIL HFA;VENTOLIN HFA  Inhale 2 puffs into the lungs every 6 (six) hours as needed for wheezing.     amLODipine 5 MG tablet  Commonly known as:  NORVASC  Take 1 tablet by mouth daily.     aspirin EC 81 MG tablet  Take  1 tablet by mouth daily.     aspirin EC 325 MG tablet  Take 1 tablet (325 mg total) by mouth 2 (two) times daily.     benzonatate 100 MG capsule  Commonly known as:  TESSALON  Take 1 capsule (100 mg total) by mouth every 6 (six) hours as needed for cough.     calcium carbonate 600 MG Tabs tablet  Commonly known as:  OS-CAL  Take 600 mg by mouth daily.     DULoxetine 60 MG capsule  Commonly known as:  CYMBALTA  Take 60 mg by mouth 3 (three) times a week.     famotidine 40 MG tablet  Commonly known as:  PEPCID  Take 1 tablet by mouth daily.     ipratropium 0.06 % nasal spray  Commonly known as:  ATROVENT  Place 2 sprays into the nose 4 (four) times daily. If needed     levothyroxine 50 MCG tablet  Commonly known as:  SYNTHROID, LEVOTHROID  Take 50 mcg by mouth daily before breakfast.     LORazepam 0.5 MG tablet  Commonly known as:  ATIVAN  Take 0.5 mg by mouth daily as needed for anxiety.     metoprolol  succinate 50 MG 24 hr tablet  Commonly known as:  TOPROL-XL  Take 50 mg by mouth 2 (two) times daily.     multivitamin tablet  Take 1 tablet by mouth daily.     oxyCODONE-acetaminophen 5-325 MG per tablet  Commonly known as:  ROXICET  Take 1 tablet by mouth every 4 (four) hours as needed.     SPIRIVA HANDIHALER 18 MCG inhalation capsule  Generic drug:  tiotropium  INHALE THE CONTENTS OF 1 CAPSULE ONCE DAILY     tizanidine 2 MG capsule  Commonly known as:  ZANAFLEX  Take 1 capsule (2 mg total) by mouth 3 (three) times daily.     traMADol 50 MG tablet  Commonly known as:  ULTRAM  Take 50 mg by mouth every 6 (six) hours as needed for moderate pain.        Diagnostic Studies: Dg Chest 2 View  04/20/2014   CLINICAL DATA:  Preop for hip replacement  EXAM: CHEST  2 VIEW  COMPARISON:  03/31/2014  FINDINGS: Cardiomediastinal silhouette is stable. Again noted mild right hilar prominence. Mass or adenopathy cannot be excluded. Follow-up nonemergent CT scan of the chest is again recommended. Moderate size hiatal hernia measures about 5 cm. Moderate infiltrate or pulmonary edema. Osteopenia and degenerative changes thoracic spine.  IMPRESSION: No active disease. Persistent mild right hilar prominence. Mass or adenopathy cannot be excluded. Follow-up nonemergent CT scan of the chest is recommended. Moderate size hiatal hernia.   Electronically Signed   By: Natasha MeadLiviu  Pop M.D.   On: 04/20/2014 11:40   Dg Chest 2 View  04/01/2014   ADDENDUM REPORT: 04/01/2014 07:50  ADDENDUM: Addendum requested for clarification.  Please change the second paragraph of the impression to state:  Hyperinflation with question RIGHT hilar enlargement which could represent adenopathy or perihilar nodule ; RIGHT hilum is different in appearance from the previous exams.  CT chest with contrast recommended to assess.   Electronically Signed   By: Ulyses SouthwardMark  Boles M.D.   On: 04/01/2014 07:50   04/01/2014   CLINICAL DATA:  Preoperative  evaluation for hip surgery, history hypertension, COPD, bronchiectasis, former smoker  EXAM: CHEST  2 VIEW  COMPARISON:  08/20/2013, 05/12/2012  FINDINGS: Borderline enlargement of cardiac silhouette.  Moderate-sized hiatal hernia.  Mediastinal contours normal.  Question enlargement of RIGHT hilum, could represent adenopathy or perihilar nodule.  Remaining lungs hyperinflated but clear.  No pleural effusion or pneumothorax.  Scattered degenerative disc disease changes of the thoracic spine.  IMPRESSION: Moderate-sized hiatal hernia.  Hyperinflation with question RIGHT hilar enlargement could represent adenopathy or perihilar nodule, different in appearance from previous exam; CT chest with high recommended to assess.  These results will be called to the ordering clinician or representative by the Radiologist Assistant, and communication documented in the PACS or zVision Dashboard.  Electronically Signed: By: Ulyses Southward M.D. On: 03/31/2014 16:43   Dg Pelvis Portable  04/20/2014   CLINICAL DATA:  81 year old female status post left hip arthroplasty. Initial encounter.  EXAM: PORTABLE PELVIS 1-2 VIEWS  COMPARISON:  01/04/2014.  FINDINGS: Portable AP view at 1541 hrs. New left hip arthroplasty. Hardware components appear intact and normally aligned in the AP projection. Postoperative changes to the surrounding soft tissues. Preexisting right hip arthroplasty components appear intact. No unexpected osseous changes.  IMPRESSION: Left hip arthroplasty with no adverse features.   Electronically Signed   By: Odessa Fleming M.D.   On: 04/20/2014 16:02    Disposition: 01-Home or Self Care      Discharge Instructions    Call MD / Call 911    Complete by:  As directed   If you experience chest pain or shortness of breath, CALL 911 and be transported to the hospital emergency room.  If you develope a fever above 101 F, pus (white drainage) or increased drainage or redness at the wound, or calf pain, call your surgeon's  office.     Change dressing    Complete by:  As directed   You may change your dressing on day 5, then change the dressing daily with sterile 4 x 4 inch gauze dressing and paper tape.  You may clean the incision with alcohol prior to redressing     Constipation Prevention    Complete by:  As directed   Drink plenty of fluids.  Prune juice may be helpful.  You may use a stool softener, such as Colace (over the counter) 100 mg twice a day.  Use MiraLax (over the counter) for constipation as needed.     Diet - low sodium heart healthy    Complete by:  As directed      Discharge instructions    Complete by:  As directed   Follow up in office with Dr. Turner Daniels in 2 weeks from post op date.     Driving restrictions    Complete by:  As directed   No driving for 2 weeks     Follow the hip precautions as taught in Physical Therapy    Complete by:  As directed      Increase activity slowly as tolerated    Complete by:  As directed      Patient may shower    Complete by:  As directed   You may shower without a dressing once there is no drainage.  Do not wash over the wound.  If drainage remains, cover wound with plastic wrap and then shower.           Follow-up Information    Follow up with Nestor Lewandowsky, MD In 2 weeks.   Specialty:  Orthopedic Surgery   Contact information:   [address] [place] [address] [phone]        Signed: Henry Russel 04/25/2014, 9:51 AM

## 2014-04-25 NOTE — Progress Notes (Signed)
Physical Therapy Treatment Patient Details Name: [name] MRN: [id number] DOB: 04-27-1928 Today's Date: 04/25/2014    History of Present Illness Pt. admitted 04/20/14, underwent L THA (posteriolateral approach).  Pt. has had episodes of confusion and combativeness today per RN.  Pt,. with h/o COPD and R THA.  04/22/14, confusion/agitation continues    PT Comments    Pt improving from mobility stand point but con't to have some agitation towards family. Pt con't to be easily distracted and poor recall of THA post hip precautions. Pt remains unsafe to return home alone. con't to recommend ST-SNF upon d/c.   Follow Up Recommendations  SNF;Supervision/Assistance - 24 hour     Equipment Recommendations       Recommendations for Other Services       Precautions / Restrictions Precautions Precautions: Posterior Hip Precaution Booklet Issued: Yes (comment) Precaution Comments: reviewed 3/3 hip precautions but only recalled 1/3. Restrictions Weight Bearing Restrictions: Yes LLE Weight Bearing: Weight bearing as tolerated    Mobility  Bed Mobility               General bed mobility comments: pt up in chair  Transfers Overall transfer level: Needs assistance Equipment used: Rolling walker (2 wheeled) Transfers: Sit to/from Stand Sit to Stand: Min guard         General transfer comment: v/c's for safe hand placement, increased time  Ambulation/Gait Ambulation/Gait assistance: Min guard Ambulation Distance (Feet): 180 Feet Assistive device: Rolling walker (2 wheeled) Gait Pattern/deviations: Step-through pattern;Decreased stride length Gait velocity: slow   General Gait Details: pt extremely slow, easily distracted and took freq standing rest breaks to roll shoulders and "stretch" out her back. pt c/o "why are my shoulders so sore" freq v/c's to stay on task. minimal antalgia but very short step length   Stairs            Wheelchair Mobility     Modified Rankin (Stroke Patients Only)       Balance           Standing balance support: Single extremity supported Standing balance-Leahy Scale: Poor Standing balance comment: pt attempted to fix coffee and butter muffin in standing but was unable to maintain balance to use both hands to open creamer and jelly requiring assist.                    Cognition Arousal/Alertness: Awake/alert Behavior During Therapy: WFL for tasks assessed/performed Overall Cognitive Status: Within Functional Limits for tasks assessed       Memory: Decreased short-term memory              Exercises      General Comments        Pertinent Vitals/Pain Pain Assessment: 0-10 Pain Score: 2  Pain Location: Lt hip Pain Intervention(s): Monitored during session    Home Living                      Prior Function            PT Goals (current goals can now be found in the care plan section) Progress towards PT goals: Progressing toward goals    Frequency  7X/week    PT Plan Current plan remains appropriate    Co-evaluation             End of Session Equipment Utilized During Treatment: Gait belt Activity Tolerance: Patient tolerated treatment well Patient left: in chair;with call bell/phone within reach;with chair  alarm set     Time: 807 150 [id number] PT Time Calculation (min) (ACUTE ONLY): 28 min  Charges:  $Gait Training: 23-37 mins                    G Codes:      Marcene BrawnChadwell, Tahni Porchia Marie 04/25/2014, 8:08 AM   Lewis ShockAshly Chelsae Zanella, PT, DPT Pager #: [phone] Office #: [phone]

## 2014-04-25 NOTE — Care Management Note (Signed)
    Page 1 of 1   04/25/2014     2:12:45 PM CARE MANAGEMENT NOTE 04/25/2014  Patient:  [name],[name]   Account Number:  [id number]  Date Initiated:  04/21/2014  Documentation initiated by:  [facility] IncKRIEG,MARY  Subjective/Objective Assessment:   s/p left total hip arthroplasty     Action/Plan:   PT/OT evals-   Anticipated DC Date:  04/22/2014   Anticipated DC Plan:  SKILLED NURSING FACILITY  In-house referral  Clinical Social Worker      DC Planning Services  CM consult      Choice offered to / List presented to:             Status of service:  Completed, signed off Medicare Important Message given?  YES (If response is "NO", the following Medicare IM given date fields will be blank) Date Medicare IM given:  04/25/2014 Medicare IM given by:  Elmer BalesOBARGE,Kiondre Grenz Date Additional Medicare IM given:   Additional Medicare IM given by:    Discharge Disposition:  SKILLED NURSING FACILITY  Per UR Regulation:  Reviewed for med. necessity/level of care/duration of stay  If discussed at Long Length of Stay Meetings, dates discussed:    Comments:  04/25/14 1135 Elmer Balesourtney Nayda Riesen RN, MSN, CM- Medicare IM letter provided.

## 2014-04-25 NOTE — Discharge Planning (Addendum)
[facility] Patient's son and daughter-in-law left message regarding discharge. Per RN, patient's son updated via [employer].  *Bundle patient*  Facility: [facility] Report number: [id number] Transportation: EMS ([facility])  Marcelline Deistmily Dawn Convery, LCSWA [phone]) Licensed Clinical Social Worker Orthopedics [phone]([id number]) and Surgical [phone]([id number])

## 2014-04-26 ENCOUNTER — Non-Acute Institutional Stay (SKILLED_NURSING_FACILITY): Payer: Medicare Other | Admitting: Adult Health

## 2014-04-26 DIAGNOSIS — M1610 Unilateral primary osteoarthritis, unspecified hip: Secondary | ICD-10-CM

## 2014-04-26 DIAGNOSIS — I10 Essential (primary) hypertension: Secondary | ICD-10-CM

## 2014-04-26 DIAGNOSIS — F329 Major depressive disorder, single episode, unspecified: Secondary | ICD-10-CM

## 2014-04-26 DIAGNOSIS — K219 Gastro-esophageal reflux disease without esophagitis: Secondary | ICD-10-CM | POA: Diagnosis not present

## 2014-04-26 DIAGNOSIS — K5900 Constipation, unspecified: Secondary | ICD-10-CM | POA: Diagnosis not present

## 2014-04-26 DIAGNOSIS — M1990 Unspecified osteoarthritis, unspecified site: Secondary | ICD-10-CM | POA: Diagnosis not present

## 2014-04-26 DIAGNOSIS — F419 Anxiety disorder, unspecified: Secondary | ICD-10-CM

## 2014-04-26 DIAGNOSIS — F32A Depression, unspecified: Secondary | ICD-10-CM

## 2014-04-26 DIAGNOSIS — J449 Chronic obstructive pulmonary disease, unspecified: Secondary | ICD-10-CM | POA: Diagnosis not present

## 2014-04-26 DIAGNOSIS — E039 Hypothyroidism, unspecified: Secondary | ICD-10-CM | POA: Diagnosis not present

## 2014-04-27 ENCOUNTER — Non-Acute Institutional Stay (SKILLED_NURSING_FACILITY): Payer: Medicare Other | Admitting: Internal Medicine

## 2014-04-27 DIAGNOSIS — I10 Essential (primary) hypertension: Secondary | ICD-10-CM | POA: Diagnosis not present

## 2014-04-27 DIAGNOSIS — E039 Hypothyroidism, unspecified: Secondary | ICD-10-CM

## 2014-04-27 DIAGNOSIS — J449 Chronic obstructive pulmonary disease, unspecified: Secondary | ICD-10-CM | POA: Diagnosis not present

## 2014-04-27 DIAGNOSIS — M1610 Unilateral primary osteoarthritis, unspecified hip: Secondary | ICD-10-CM

## 2014-04-27 DIAGNOSIS — K219 Gastro-esophageal reflux disease without esophagitis: Secondary | ICD-10-CM

## 2014-04-27 DIAGNOSIS — M1990 Unspecified osteoarthritis, unspecified site: Secondary | ICD-10-CM | POA: Diagnosis not present

## 2014-04-27 DIAGNOSIS — K5900 Constipation, unspecified: Secondary | ICD-10-CM | POA: Diagnosis not present

## 2014-04-27 DIAGNOSIS — F418 Other specified anxiety disorders: Secondary | ICD-10-CM | POA: Diagnosis not present

## 2014-04-28 ENCOUNTER — Encounter: Payer: Self-pay | Admitting: Adult Health

## 2014-04-28 NOTE — Progress Notes (Addendum)
Patient ID: [name], female   DOB: 01-01-1929, 81 y.o.   MRN: [id number]   04/26/14  Facility:  Nursing Home [facility] Room Number: 1202-P LEVEL OF CARE:  SNF (31)   Chief Complaint  Patient presents with  . Hospitalization Follow-up    Arthritis S/P left total hip arthroplasty, GERD, constipation, hypertension, depression, hypothyroidism, COPD and anxiety    HISTORY OF PRESENT ILLNESS:  This is an 81 year old female who [facility] with arthritis status post left total hip arthroplasty. She has past medical history of hypertension, COPD, GERD, hypothyroidism and IBS. She has been admitted for a short-term rehabilitation.  PAST MEDICAL HISTORY:  Past Medical History  Diagnosis Date  . Hypertension   . COPD (chronic obstructive pulmonary disease)   . Kidney disease   . Shortness of breath   . Hypothyroidism   . GERD (gastroesophageal reflux disease)   . Arthritis   . Weight loss     10-12 lbs in 2 months (03/2011)  . Pneumonia   . Anemia   . Irritable bowel syndrome   . Dry skin   . Abnormal heart rhythm     pt not sure what kind.    CURRENT MEDICATIONS: Reviewed per MAR/see medication list  Allergies  Allergen Reactions  . Avelox [Moxifloxacin Hcl In Nacl] Other (See Comments)    Flushes face  . Biaxin [Clarithromycin]     Diarrhea and bad taste in mouth  . Nsaids     Does not take due to kidney disease     REVIEW OF SYSTEMS:  GENERAL: no change in appetite, no fatigue, no weight changes, no fever, chills or weakness RESPIRATORY: no cough, SOB, DOE, wheezing, hemoptysis CARDIAC: no chest pain, edema or palpitations GI: no abdominal pain, diarrhea, constipation, heart burn, nausea or vomiting  PHYSICAL EXAMINATION  GENERAL: no acute distress, normal body habitus SKIN:  Left hip surgical site is dry and no erythema EYES: conjunctivae normal, sclerae normal,  normal eye lids NECK: supple, trachea midline, no neck masses, no thyroid tenderness, no thyromegaly LYMPHATICS: no LAN in the neck, no supraclavicular LAN RESPIRATORY: breathing is even & unlabored, BS CTAB CARDIAC: RRR, no murmur,no extra heart sounds, no edema GI: abdomen soft, normal BS, no masses, no tenderness, no hepatomegaly, no splenomegaly EXTREMITIES: Able to move 4 extremities PSYCHIATRIC: the patient is alert & oriented to person, affect & behavior appropriate  LABS/RADIOLOGY: Labs reviewed: Basic Metabolic Panel:  Recent Labs  78/29/5600/06/17 1434 04/21/14 0707  NA 136 137  K 3.7 4.0  CL 99 102  CO2 26 31  GLUCOSE 91 149*  BUN 16 11  CREATININE 1.14* 1.11*  CALCIUM 9.9 8.7   CBC:  Recent Labs  04/07/14 1434 04/21/14 0707 04/22/14 0649 04/23/14 0605  WBC 5.9 12.1* 10.1 7.3  NEUTROABS 3.7  --   --   --   HGB 11.1* 10.4* 9.3* 8.7*  HCT 33.5* 31.8* 28.9* 26.4*  MCV 87.2 90.1 89.8 88.6  PLT 290 243 200 172     Dg Chest 2 View  04/20/2014   CLINICAL DATA:  Preop for hip replacement  EXAM: CHEST  2 VIEW  COMPARISON:  03/31/2014  FINDINGS: Cardiomediastinal silhouette is stable. Again noted mild right hilar prominence. Mass or adenopathy cannot be excluded. Follow-up nonemergent CT scan of the chest is again recommended. Moderate size hiatal hernia measures about 5 cm. Moderate infiltrate or pulmonary  edema. Osteopenia and degenerative changes thoracic spine.  IMPRESSION: No active disease. Persistent mild right hilar prominence. Mass or adenopathy cannot be excluded. Follow-up nonemergent CT scan of the chest is recommended. Moderate size hiatal hernia.   Electronically Signed   By: Natasha Mead M.D.   On: 04/20/2014 11:40   Dg Chest 2 View  04/01/2014   ADDENDUM REPORT: 04/01/2014 07:50  ADDENDUM: Addendum requested for clarification.  Please change the second paragraph of the impression to state:  Hyperinflation with question RIGHT hilar enlargement which could  represent adenopathy or perihilar nodule ; RIGHT hilum is different in appearance from the previous exams.  CT chest with contrast recommended to assess.   Electronically Signed   By: Ulyses Southward M.D.   On: 04/01/2014 07:50   04/01/2014   CLINICAL DATA:  Preoperative evaluation for hip surgery, history hypertension, COPD, bronchiectasis, former smoker  EXAM: CHEST  2 VIEW  COMPARISON:  08/20/2013, 05/12/2012  FINDINGS: Borderline enlargement of cardiac silhouette.  Moderate-sized hiatal hernia.  Mediastinal contours normal.  Question enlargement of RIGHT hilum, could represent adenopathy or perihilar nodule.  Remaining lungs hyperinflated but clear.  No pleural effusion or pneumothorax.  Scattered degenerative disc disease changes of the thoracic spine.  IMPRESSION: Moderate-sized hiatal hernia.  Hyperinflation with question RIGHT hilar enlargement could represent adenopathy or perihilar nodule, different in appearance from previous exam; CT chest with high recommended to assess.  These results will be called to the ordering clinician or representative by the Radiologist Assistant, and communication documented in the PACS or zVision Dashboard.  Electronically Signed: By: Ulyses Southward M.D. On: 03/31/2014 16:43   Dg Pelvis Portable  04/20/2014   CLINICAL DATA:  81 year old female status post left hip arthroplasty. Initial encounter.  EXAM: PORTABLE PELVIS 1-2 VIEWS  COMPARISON:  01/04/2014.  FINDINGS: Portable AP view at 1541 hrs. New left hip arthroplasty. Hardware components appear intact and normally aligned in the AP projection. Postoperative changes to the surrounding soft tissues. Preexisting right hip arthroplasty components appear intact. No unexpected osseous changes.  IMPRESSION: Left hip arthroplasty with no adverse features.   Electronically Signed   By: Odessa Fleming M.D.   On: 04/20/2014 16:02    ASSESSMENT/PLAN:  Arthritis S/P left total hip arthroplasty - for rehabilitation; continue aspirin EC 325  mg 1 tab by mouth twice a day for DVT prophylaxis and Percocet 5/325 mg 1 tab by mouth every 4 hours when necessary for pain GERD - stable; continue Prilosec 40 mg by mouth daily Constipation - start MiraLAX 17 g +4-6 ounces liquid by mouth twice a day and senna S2 tabs by mouth twice a day Hypertension - well controlled; continue Norvasc 5 mg by mouth daily and Toprol-XL 50 mg 1 tab by mouth twice a day Depression - mood is is stable; continue Cymbalta 60 mg 3 times/week Hypothyroidism - continue Synthroid 50 g 1 tab by mouth daily COPD - continue Spiriva HandiHaler 18 g inhale 1 capsule daily Anxiety - continue Ativan 0.5 mg 1 tab by mouth daily when necessary   Goals of care:  Short-term rehabilitation   Labs/test ordered:  none  Spent 50 minutes in patient care.    [facility], NP [employer] [phone]

## 2014-04-29 ENCOUNTER — Encounter: Payer: Self-pay | Admitting: Adult Health

## 2014-04-29 ENCOUNTER — Non-Acute Institutional Stay (SKILLED_NURSING_FACILITY): Payer: Medicare Other | Admitting: Adult Health

## 2014-04-29 ENCOUNTER — Other Ambulatory Visit: Payer: Self-pay | Admitting: *Deleted

## 2014-04-29 DIAGNOSIS — F329 Major depressive disorder, single episode, unspecified: Secondary | ICD-10-CM

## 2014-04-29 DIAGNOSIS — J449 Chronic obstructive pulmonary disease, unspecified: Secondary | ICD-10-CM

## 2014-04-29 DIAGNOSIS — M1610 Unilateral primary osteoarthritis, unspecified hip: Secondary | ICD-10-CM

## 2014-04-29 DIAGNOSIS — F32A Depression, unspecified: Secondary | ICD-10-CM

## 2014-04-29 DIAGNOSIS — E039 Hypothyroidism, unspecified: Secondary | ICD-10-CM

## 2014-04-29 DIAGNOSIS — K5900 Constipation, unspecified: Secondary | ICD-10-CM

## 2014-04-29 DIAGNOSIS — F419 Anxiety disorder, unspecified: Secondary | ICD-10-CM

## 2014-04-29 DIAGNOSIS — M1990 Unspecified osteoarthritis, unspecified site: Secondary | ICD-10-CM | POA: Diagnosis not present

## 2014-04-29 DIAGNOSIS — I10 Essential (primary) hypertension: Secondary | ICD-10-CM

## 2014-04-29 DIAGNOSIS — J4489 Other specified chronic obstructive pulmonary disease: Secondary | ICD-10-CM

## 2014-04-29 DIAGNOSIS — K219 Gastro-esophageal reflux disease without esophagitis: Secondary | ICD-10-CM | POA: Diagnosis not present

## 2014-04-29 MED ORDER — LORAZEPAM 0.5 MG PO TABS: ORAL_TABLET | ORAL | Status: DC

## 2014-04-29 NOTE — Progress Notes (Signed)
Patient ID: [name], female   DOB: 04-09-1928, 81 y.o.   MRN: [id number] Patient ID: [name], female   DOB: 05-06-1928, 81 y.o.   MRN: [id number]   04/29/14  Facility:  Nursing Home Location:  [facility] Room Number: 1202-P LEVEL OF CARE:  SNF (31)   Chief Complaint  Patient presents with  . Discharge Note    Arthritis S/P left total hip arthroplasty, GERD, constipation, hypertension, depression, hypothyroidism, COPD and anxiety    HISTORY OF PRESENT ILLNESS:  This is an 81 year old female who is for discharge home with home health PT and OT for ambulation and self care skills.  She has been admitted to [facility] on 04/25/14 [facility] with arthritis status post left total hip arthroplasty. She has past medical history of hypertension, COPD, GERD, hypothyroidism and IBS.   Patient was admitted to this facility for short-term rehabilitation after the patient's recent hospitalization.  Patient has completed SNF rehabilitation and therapy has cleared the patient for discharge.   PAST MEDICAL HISTORY:  Past Medical History  Diagnosis Date  . Hypertension   . COPD (chronic obstructive pulmonary disease)   . Kidney disease   . Shortness of breath   . Hypothyroidism   . GERD (gastroesophageal reflux disease)   . Arthritis   . Weight loss     10-12 lbs in 2 months (03/2011)  . Pneumonia   . Anemia   . Irritable bowel syndrome   . Dry skin   . Abnormal heart rhythm     pt not sure what kind.    CURRENT MEDICATIONS: Reviewed per MAR/see medication list  Allergies  Allergen Reactions  . Avelox [Moxifloxacin Hcl In Nacl] Other (See Comments)    Flushes face  . Biaxin [Clarithromycin]     Diarrhea and bad taste in mouth  . Nsaids     Does not take due to kidney disease     REVIEW OF SYSTEMS:  GENERAL: no change in appetite, no fatigue, no weight changes, no fever, chills or weakness RESPIRATORY: no cough, SOB, DOE,  wheezing, hemoptysis CARDIAC: no chest pain, edema or palpitations GI: no abdominal pain, diarrhea, constipation, heart burn, nausea or vomiting  PHYSICAL EXAMINATION  GENERAL: no acute distress, normal body habitus SKIN:  Left hip surgical site is dry and no erythema; aquacell dressing intact EYES: conjunctivae normal, sclerae normal, normal eye lids NECK: supple, trachea midline, no neck masses, no thyroid tenderness, no thyromegaly LYMPHATICS: no LAN in the neck, no supraclavicular LAN RESPIRATORY: breathing is even & unlabored, BS CTAB CARDIAC: RRR, no murmur,no extra heart sounds, no edema GI: abdomen soft, normal BS, no masses, no tenderness, no hepatomegaly, no splenomegaly EXTREMITIES: Able to move 4 extremities PSYCHIATRIC: the patient is alert & oriented to person, affect & behavior appropriate  LABS/RADIOLOGY: Labs reviewed: Basic Metabolic Panel:  Recent Labs  78/29/56 1434 04/21/14 0707  NA 136 137  K 3.7 4.0  CL 99 102  CO2 26 31  GLUCOSE 91 149*  BUN 16 11  CREATININE 1.14* 1.11*  CALCIUM 9.9 8.7   CBC:  Recent Labs  04/07/14 1434 04/21/14 0707 04/22/14 0649 04/23/14 0605  WBC 5.9 12.1* 10.1 7.3  NEUTROABS 3.7  --   --   --   HGB 11.1* 10.4* 9.3* 8.7*  HCT 33.5* 31.8* 28.9* 26.4*  MCV 87.2 90.1 89.8 88.6  PLT 290 243 200 172    Dg Chest 2 View  04/20/2014  CLINICAL DATA:  Preop for hip replacement  EXAM: CHEST  2 VIEW  COMPARISON:  03/31/2014  FINDINGS: Cardiomediastinal silhouette is stable. Again noted mild right hilar prominence. Mass or adenopathy cannot be excluded. Follow-up nonemergent CT scan of the chest is again recommended. Moderate size hiatal hernia measures about 5 cm. Moderate infiltrate or pulmonary edema. Osteopenia and degenerative changes thoracic spine.  IMPRESSION: No active disease. Persistent mild right hilar prominence. Mass or adenopathy cannot be excluded. Follow-up nonemergent CT scan of the chest is recommended. Moderate  size hiatal hernia.   Electronically Signed   By: Deanna MeadLiviu  [name] M.D.   On: 04/20/2014 11:40   Dg Chest 2 View  04/01/2014   ADDENDUM REPORT: 04/01/2014 07:50  ADDENDUM: Addendum requested for clarification.  Please change the second paragraph of the impression to state:  Hyperinflation with question RIGHT hilar enlargement which could represent adenopathy or perihilar nodule ; RIGHT hilum is different in appearance from the previous exams.  CT chest with contrast recommended to assess.   Electronically Signed   By: Deanna SouthwardMark  [name] M.D.   On: 04/01/2014 07:50   04/01/2014   CLINICAL DATA:  Preoperative evaluation for hip surgery, history hypertension, COPD, bronchiectasis, former smoker  EXAM: CHEST  2 VIEW  COMPARISON:  08/20/2013, 05/12/2012  FINDINGS: Borderline enlargement of cardiac silhouette.  Moderate-sized hiatal hernia.  Mediastinal contours normal.  Question enlargement of RIGHT hilum, could represent adenopathy or perihilar nodule.  Remaining lungs hyperinflated but clear.  No pleural effusion or pneumothorax.  Scattered degenerative disc disease changes of the thoracic spine.  IMPRESSION: Moderate-sized hiatal hernia.  Hyperinflation with question RIGHT hilar enlargement could represent adenopathy or perihilar nodule, different in appearance from previous exam; CT chest with high recommended to assess.  These results will be called to the ordering clinician or representative by the Radiologist Assistant, and communication documented in the PACS or zVision Dashboard.  Electronically Signed: By: Deanna SouthwardMark  [name] M.D. On: 03/31/2014 16:43   Dg Pelvis Portable  04/20/2014   CLINICAL DATA:  81 year old female status post left hip arthroplasty. Initial encounter.  EXAM: PORTABLE PELVIS 1-2 VIEWS  COMPARISON:  01/04/2014.  FINDINGS: Portable AP view at 1541 hrs. New left hip arthroplasty. Hardware components appear intact and normally aligned in the AP projection. Postoperative changes to the surrounding soft  tissues. Preexisting right hip arthroplasty components appear intact. No unexpected osseous changes.  IMPRESSION: Left hip arthroplasty with no adverse features.   Electronically Signed   By: Odessa FlemingH  Hall M.D.   On: 04/20/2014 16:02    ASSESSMENT/PLAN:  Arthritis S/P left total hip arthroplasty - for Home health PT,and OT; continue aspirin EC 325 mg 1 tab by mouth twice a day for DVT prophylaxis and Percocet 5/325 mg 1 tab by mouth every 4 hours when necessary for pain GERD - stable; continue Prilosec 40 mg by mouth daily Constipation - continue MiraLAX 17 g +4-6 ounces liquid by mouth twice a day and senna S2 tabs by mouth twice a day Hypertension - well controlled; continue Norvasc 5 mg by mouth daily and Toprol-XL 50 mg 1 tab by mouth twice a day Depression - mood is is stable; continue Cymbalta 60 mg 3 times/week Hypothyroidism - continue Synthroid 50 g 1 tab by mouth daily COPD - continue Spiriva HandiHaler 18 g inhale 1 capsule daily Anxiety - continue Ativan 0.5 mg 1 tab by mouth daily when necessary    I have filled out patient's discharge paperwork and written prescriptions.  Patient will receive  home health PT and OT.  Total discharge time: Greater than 30 minutes  Discharge time involved coordination of the discharge process with social worker, nursing staff and therapy department. Medical justification for home health services verified.   [facility], NP [employer] [phone]

## 2014-04-29 NOTE — Telephone Encounter (Signed)
[facility] # [phone] Fax: [phone] 

## 2014-05-29 NOTE — Progress Notes (Signed)
Patient ID: [name], female   DOB: 03-15-1928, 81 y.o.   MRN: [id number]     [facility]   PCP: Rosario AdieBeck, Eric W, MD  Code Status: full code  Allergies  Allergen Reactions  . Avelox [Moxifloxacin Hcl In Nacl] Other (See Comments)    Flushes face  . Biaxin [Clarithromycin]     Diarrhea and bad taste in mouth  . Nsaids     Does not take due to kidney disease    Chief Complaint  Patient presents with  . New Admit To SNF     HPI:  81 year old patient is here for short term rehabilitation post hospital admission from 04/20/14-04/25/14 with left hip arthritis. She underwent total left hip arthroplasty. She is seen in her room today. She mentions that she is upset with the service in the facility. Her pain is under control. Denies muscle spasm. She had a bowel movement last night. Denies any concerns. She has past medical history of hypertension, COPD, GERD, hypothyroidism and IBS.   Review of Systems:  Constitutional: Negative for fever, chills, diaphoresis.  HENT: Negative for headache, congestion  Respiratory: Negative for cough, shortness of breath and wheezing.   Cardiovascular: Negative for chest pain, palpitations. Positive for leg swelling.  Gastrointestinal: Negative for heartburn, nausea, vomiting, abdominal pain Genitourinary: Negative for dysuria, flank pain.  Musculoskeletal: Negative for back pain, falls Skin: Negative for itching, rash.  Neurological: Negative for weakness Psychiatric/Behavioral: Negative for depression.    Past Medical History  Diagnosis Date  . Hypertension   . COPD (chronic obstructive pulmonary disease)   . Kidney disease   . Shortness of breath   . Hypothyroidism   . GERD (gastroesophageal reflux disease)   . Arthritis   . Weight loss     10-12 lbs in 2 months (03/2011)  . Pneumonia   . Anemia   . Irritable bowel syndrome   . Dry skin   . Abnormal heart rhythm     pt not sure what kind.   Past  Surgical History  Procedure Laterality Date  . Tonsillectomy    . Thyroidectomy      Left  . Esophagogastroduodenoscopy    . Colonoscopy    . Anterior and posterior vaginal repair    . Cardiac catheterization      6 years ago  . Total hip arthroplasty  04/24/2011    Procedure: TOTAL HIP ARTHROPLASTY;  Surgeon: Nestor LewandowskyFrank J Rowan, MD;  Location: [facility] OR;  Service: Orthopedics;  Laterality: Right;  DEPUY/PINNACLE CUP, SUMMIT BASIC STEM  . Eye surgery Bilateral 09/2013    cataract surgery with lens implant  . Toe amputation Left     2nd toe  . Total hip arthroplasty Left 04/20/2014    Procedure: TOTAL HIP ARTHROPLASTY;  Surgeon: Nestor LewandowskyFrank J Rowan, MD;  Location: [facility] OR;  Service: Orthopedics;  Laterality: Left;   Social History:   reports that she quit smoking about 32 years ago. Her smoking use included Cigarettes. She has a 52 pack-year smoking history. She has never used smokeless tobacco. She reports that she drinks about 0.6 oz of alcohol per week. She reports that she does not use illicit drugs.  Family History  Problem Relation Age of Onset  . Emphysema Sister     smoker  . Allergies Brother   . Heart disease Brother   . Rheum arthritis Daughter   . Cancer Mother     GI  . Cancer Brother  throat  . Cancer Sister     unsure    Medications: Patient's Medications  New Prescriptions   No medications on file  Previous Medications   ACETAMINOPHEN (TYLENOL) 500 MG TABLET    Take 1,000 mg by mouth every 8 (eight) hours as needed. Take with tramadol   ALBUTEROL (PROVENTIL HFA;VENTOLIN HFA) 108 (90 BASE) MCG/ACT INHALER    Inhale 2 puffs into the lungs every 6 (six) hours as needed for wheezing.   AMLODIPINE (NORVASC) 5 MG TABLET    Take 1 tablet by mouth daily.   ASPIRIN EC 325 MG TABLET    Take 1 tablet (325 mg total) by mouth 2 (two) times daily.   ASPIRIN EC 81 MG TABLET    Take 1 tablet by mouth daily.   BENZONATATE (TESSALON) 100 MG CAPSULE    Take 1 capsule (100 mg total) by mouth  every 6 (six) hours as needed for cough.   CALCIUM CARBONATE (OS-CAL) 600 MG TABS    Take 600 mg by mouth daily.   DULOXETINE (CYMBALTA) 60 MG CAPSULE    Take 60 mg by mouth 3 (three) times a week.   FAMOTIDINE (PEPCID) 40 MG TABLET    Take 1 tablet by mouth daily.   IPRATROPIUM (ATROVENT) 0.06 % NASAL SPRAY    Place 2 sprays into the nose 4 (four) times daily. If needed   LEVOTHYROXINE (SYNTHROID, LEVOTHROID) 50 MCG TABLET    Take 50 mcg by mouth daily before breakfast.   LORAZEPAM (ATIVAN) 0.5 MG TABLET    Take one tablet by mouth once daily as needed for anxiety   METOPROLOL (TOPROL-XL) 50 MG 24 HR TABLET    Take 50 mg by mouth 2 (two) times daily.    MULTIPLE VITAMIN (MULTIVITAMIN) TABLET    Take 1 tablet by mouth daily.     OXYCODONE-ACETAMINOPHEN (ROXICET) 5-325 MG PER TABLET    Take 1 tablet by mouth every 4 (four) hours as needed.   SPIRIVA HANDIHALER 18 MCG INHALATION CAPSULE    INHALE THE CONTENTS OF 1 CAPSULE ONCE DAILY   TIZANIDINE (ZANAFLEX) 2 MG CAPSULE    Take 1 capsule (2 mg total) by mouth 3 (three) times daily.   TRAMADOL (ULTRAM) 50 MG TABLET    Take 50 mg by mouth every 6 (six) hours as needed for moderate pain.  Modified Medications   No medications on file  Discontinued Medications   No medications on file     Physical Exam: Filed Vitals:   04/27/14 1522  BP: 133/78  Pulse: 73  Temp: 97.2 F (36.2 C)  Resp: 16  SpO2: 96%    General- elderly female, in no acute distress Head- normocephalic, atraumatic Throat- moist mucus membrane Cardiovascular- normal s1,s2, no murmurs, palpable dorsalis pedis and radial pulses, 1+ left leg edema Respiratory- bilateral clear to auscultation, no wheeze, no rhonchi, no crackles, no use of accessory muscles Abdomen- bowel sounds present, soft, non tender Musculoskeletal- able to move all 4 extremities, limited left hip range of motion  Neurological- no focal deficit Skin- warm and dry, aquacel dressing to left hip surgical  incision site Psychiatry- alert and oriented to person, place and time, normal mood and affect    Labs reviewed: Basic Metabolic Panel:  Recent Labs  40/98/11 1434 04/21/14 0707  NA 136 137  K 3.7 4.0  CL 99 102  CO2 26 31  GLUCOSE 91 149*  BUN 16 11  CREATININE 1.14* 1.11*  CALCIUM 9.9 8.7  CBC:  Recent Labs  04/07/14 1434 04/21/14 0707 04/22/14 0649 04/23/14 0605  WBC 5.9 12.1* 10.1 7.3  NEUTROABS 3.7  --   --   --   HGB 11.1* 10.4* 9.3* 8.7*  HCT 33.5* 31.8* 28.9* 26.4*  MCV 87.2 90.1 89.8 88.6  PLT 290 243 200 172   Assessment/Plan  Left hip Arthritis  S/P left total hip arthroplasty. Will have him work with physical therapy and occupational therapy team to help with gait training and muscle strengthening exercises.fall precautions. Skin care. Encourage to be out of bed.  Continue aspirin EC 325 mg bid for DVT prophylaxis and Percocet 5/325 mg every 4 hours when necessary for pain. Add ted hose to help with leg edema  Hypertension Continue Norvasc 5 mg daily and Toprol-XL 50 mg bid  Depression and anxiety continue Cymbalta 60 mg 3 times a week and ativan daily prn  GERD  continue Prilosec 40 mg daily  Constipation Continue miralax and senna s  Hypothyroidism Continue Synthroid 50 mcg daily  COPD Continue Spiriva daily, monitor breathing   Goals of care: short term rehabilitation   Family/ staff Communication: reviewed care plan with patient and nursing supervisor    Oneal Grout, MD  [facility] [phone] (Monday-Friday 8 am - 5 pm) [phone] (afterhours)

## 2014-06-15 ENCOUNTER — Telehealth: Payer: Self-pay | Admitting: Internal Medicine

## 2014-06-15 MED ORDER — TIOTROPIUM BROMIDE MONOHYDRATE 18 MCG IN CAPS: 18.0000 ug | ORAL_CAPSULE | Freq: Every day | RESPIRATORY_TRACT | Status: DC

## 2014-06-15 NOTE — Telephone Encounter (Signed)
[facility] lmtcb for pt to let her know refill has been sent.

## 2014-06-15 NOTE — Telephone Encounter (Signed)
Yes

## 2014-06-15 NOTE — Telephone Encounter (Signed)
Pt aware that rx has been sent. Nothing further needed at this time.

## 2014-06-15 NOTE — Telephone Encounter (Signed)
Patient would like a year supply of Spiriva sent to [facility].  Last OV: 03/31/14; next OV: 08/02/14 Ok to fill for 1 year?  Current Outpatient Prescriptions on File Prior to Visit  Medication Sig Dispense Refill  . acetaminophen (TYLENOL) 500 MG tablet Take 1,000 mg by mouth every 8 (eight) hours as needed. Take with tramadol    . albuterol (PROVENTIL HFA;VENTOLIN HFA) 108 (90 BASE) MCG/ACT inhaler Inhale 2 puffs into the lungs every 6 (six) hours as needed for wheezing. 3 Inhaler 1  . amLODipine (NORVASC) 5 MG tablet Take 1 tablet by mouth daily.    Marland Kitchen. aspirin EC 325 MG tablet Take 1 tablet (325 mg total) by mouth 2 (two) times daily. 30 tablet 0  . aspirin EC 81 MG tablet Take 1 tablet by mouth daily.    . benzonatate (TESSALON) 100 MG capsule Take 1 capsule (100 mg total) by mouth every 6 (six) hours as needed for cough. (Patient not taking: Reported on 04/07/2014) 30 capsule 2  . calcium carbonate (OS-CAL) 600 MG TABS Take 600 mg by mouth daily.    . DULoxetine (CYMBALTA) 60 MG capsule Take 60 mg by mouth 3 (three) times a week.    . famotidine (PEPCID) 40 MG tablet Take 1 tablet by mouth daily.    Marland Kitchen. ipratropium (ATROVENT) 0.06 % nasal spray Place 2 sprays into the nose 4 (four) times daily. If needed 15 mL 12  . levothyroxine (SYNTHROID, LEVOTHROID) 50 MCG tablet Take 50 mcg by mouth daily before breakfast.    . LORazepam (ATIVAN) 0.5 MG tablet Take one tablet by mouth once daily as needed for anxiety 30 tablet 5  . metoprolol (TOPROL-XL) 50 MG 24 hr tablet Take 50 mg by mouth 2 (two) times daily.     . Multiple Vitamin (MULTIVITAMIN) tablet Take 1 tablet by mouth daily.      Marland Kitchen. oxyCODONE-acetaminophen (ROXICET) 5-325 MG per tablet Take 1 tablet by mouth every 4 (four) hours as needed. 60 tablet 0  . SPIRIVA HANDIHALER 18 MCG inhalation capsule INHALE THE CONTENTS OF 1 CAPSULE ONCE DAILY 1 capsule 1  . tizanidine (ZANAFLEX) 2 MG capsule Take 1 capsule (2 mg total) by mouth 3 (three) times  daily. 60 capsule 0  . traMADol (ULTRAM) 50 MG tablet Take 50 mg by mouth every 6 (six) hours as needed for moderate pain.     No current facility-administered medications on file prior to visit.   Allergies  Allergen Reactions  . Avelox [Moxifloxacin Hcl In Nacl] Other (See Comments)    Flushes face  . Biaxin [Clarithromycin]     Diarrhea and bad taste in mouth  . Nsaids     Does not take due to kidney disease

## 2014-08-02 ENCOUNTER — Ambulatory Visit: Payer: Medicare Other | Admitting: Internal Medicine

## 2014-09-02 ENCOUNTER — Ambulatory Visit (INDEPENDENT_AMBULATORY_CARE_PROVIDER_SITE_OTHER)
Admission: RE | Admit: 2014-09-02 | Discharge: 2014-09-02 | Disposition: A | Discharge status: Home / Self Care | Payer: Medicare Other | Source: Ambulatory Visit | Attending: Internal Medicine | Admitting: Internal Medicine

## 2014-09-02 ENCOUNTER — Ambulatory Visit (INDEPENDENT_AMBULATORY_CARE_PROVIDER_SITE_OTHER): Payer: Medicare Other | Admitting: Internal Medicine

## 2014-09-02 ENCOUNTER — Encounter: Payer: Self-pay | Admitting: Internal Medicine

## 2014-09-02 VITALS — BP 102/70 | HR 74 | Ht 59.0 in | Wt 104.0 lb

## 2014-09-02 DIAGNOSIS — J449 Chronic obstructive pulmonary disease, unspecified: Secondary | ICD-10-CM | POA: Diagnosis not present

## 2014-09-02 DIAGNOSIS — J441 Chronic obstructive pulmonary disease with (acute) exacerbation: Secondary | ICD-10-CM

## 2014-09-02 MED ORDER — AZITHROMYCIN 250 MG PO TABS: ORAL_TABLET | ORAL | Status: DC

## 2014-09-02 MED ORDER — UMECLIDINIUM-VILANTEROL 62.5-25 MCG/INH IN AEPB: 1.0000 | INHALATION_SPRAY | Freq: Every day | RESPIRATORY_TRACT | Status: AC

## 2014-09-02 NOTE — Progress Notes (Signed)
12/17/10- 81 year old female former smoker, retired Charity fundraiserN, referred courtesy of Dr. Wende [name] [place] [facility] because of COPD. She had smoked 2 packs per day for 26 years/52 pack years, ending in 1984. [name] assumes the diagnosis is COPD. She had seen [name]/Pulmonary in the past before he left town. He treated her with Spiriva which has helped. Symbicort 160 has not helped. She is aware of progressive dyspnea on exertion over the last several years. Has had episodes of bronchitis. Daily cough brings yellow and sometimes green or brown sputum. She denies blood. She was hospitalized once with pneumonia but not for other lung problems. She has not had home oxygen. Has nebulizer machine at home but is not using it. Went to an urgent care this summer for bronchitis, treated with prednisone and an antibiotic. Cardiac problems limited to occasional rapid heartbeat. Anemia treated with iron. Has had 3 spinal steroid injections and is followed by a spine specialist. 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 Intolerant of aspirin and Avelox. Asks flu shot. Has had pneumococcal vaccine but not sure of the date.  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 She had smoked 2 packs per day for 26 years/52 pack years, ending in 1984. Has had flu and pneumonia vaccines. Since last here she had fallen and hit her head with bruising. Has aseptic necrosis of the right hip and is seeing Dr.Took/ [name], but does not want surgery. Occasional cough and persistent throat clearing, some wheeze and a few night sweats. She remembers getting a nebulizer treatment once which gave ability to clear mucus from her airways. She called a television advertisement and got a nebulizer machine and medication from an out of state company. She has used  it only once and got little sputum up with that trial. CT 12/20/10- I reviewed images with her- IMPRESSION:  1. Pulmonary parenchymal pattern of diffuse mild bronchiectasis  and peribronchovascular nodularity is indicative of mycobacterium  avium complex (MAC).  2. Vague area of irregular nodularity in the right lower lobe  (image 32). While this is likely within the spectrum of presumed  MAC, a pulmonary nodule cannot be definitively excluded. If the  patient is at high risk for bronchogenic carcinoma, follow-up chest  CT at 3-6 months is recommended. If the patient is at low risk for  bronchogenic carcinoma, follow-up chest CT at 6-12 months is  recommended. This recommendation follows the consensus statement:  Guidelines for Management of Small Pulmonary Nodules Detected on CT  Scans: A Statement from the Fleischner Society as published in  Radiology 2005; 237:395-400. Online at:  DietDisorder.czhttp://www.med.[employer].edu/rad/res/Fleischner-nodule.htm.  3. Dominant right thyroid nodule with associated calcifications.  Thyroid ultrasound is recommended in further evaluation, as  clinically indicated.  4. Coronary artery calcification.  Original  Report Authenticated By: [name], M.D.   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 MAIC, ? Nodule, thyroid nodule, CAD. C/O sob with exertion, occasional wheezing, and feels like there is a band around her chest Had right hip replacement which relieved her pain. Chest feels okay now. Not coughing. Does bring up a little clear mucus occasionally, no blood. Denies fever or sweat. She is aware she has a chronic right thyroid nodule. CT scan of chest from 12/20/2010 was reviewed last visit. Sputum from 02/01/2011 was  [facility]. We have discussed indications for treatment and the option of continued observation instead of antibacterial treatment for now.  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 Nodule, thyroid nodule, CAD. Cough-productive-yellow/gray in color-noticed its happening more often than about 6 months ago. SOB with long walks.  Since last here, she had right total hip replacement in February and is doing very well. Variable cough produces thick sputum, mostly white. She denies night sweats or fever. Sneezing this spring controlled with OTC antihistamine. CT chest 07/12/11-  IMPRESSION:  1. Pulmonary parenchymal pattern of diffuse peribronchovascular  nodularity, bronchiectasis and scattered mucoid impaction is  indicative of mycobacterium avium complex (MAC).  2. Right lower lobe nodular density is unchanged and likely within  the spectrum of presumed MAC. If further follow-up is desired, CT  chest without contrast in 12 months is recommended.  3. Dominant right thyroid nodule, as before. Recommend further  evaluation with thyroid ultrasound. If patient is clinically  hyperthyroid, consider nuclear medicine thyroid uptake and scan.  Original Report Authenticated By: [name], M.D.   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 Nodule, thyroid nodule, CAD. Feels as though she is wheezing at times in her thoat area. Hoarseness . [facility], [facility]. We started triple therapy with clarithromycin 250 mg x2, rifampin 300 mg x2 and Myambutol 400 mg x3, each taken 3 days per week beginning 08/08/2011 so she has not had 2 months of therapy. She complains of throat tickle and some sneeze. She has been coughing for more than 6 months. Uses tramadol once daily with 2 Tylenol. She declines cough syrup. Chronic back pain/physical therapy.  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 Nodule, thyroid nodule, CAD. We started triple therapy with clarithromycin 250 mg x2, rifampin 300 mg x2 and Myambutol 400 mg  x3, each taken 3 days per week beginning 08/08/2011 Increase in SOB and wheezing since last visit. Still with some of the prednisone taper we gave. Now persistent sense of something in her throat and feels wheeze originating in her throat. Had some laryngitis. She does feel reflux despite Pepcid. Annoying watery rhinorrhea COPD assessment test (CAT) score 10/24 CXR- 10/10/11 IMPRESSION:  1. No radiographic evidence of acute cardiopulmonary disease.  Original Report Authenticated By: Florencia ReasonsANIEL W. ENTRIKIN, M.D.   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 Nodule, thyroid nodule, CAD. We started triple therapy with clarithromycin 250 mg x2, rifampin 300 mg x2 and Myambutol 400 mg x3, each taken 3 days per week beginning 08/08/2011 FOLLOWS FOR: no more SOB and wheezing than at last visit; cough-productive Denies night sweats or fever. Sputum is mostly clear. CXR 8/6 / 13- to watch right lower lung zone for possible atelectasis IMPRESSION:  1. No radiographic evidence of acute cardiopulmonary disease.  Original Report Authenticated By: Florencia ReasonsANIEL W. ENTRIKIN, M.D.  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 Nodule, thyroid nodule, CAD. We started triple therapy with clarithromycin 250 mg x2, rifampin 300 mg x2 and Myambutol 400 mg x3, each taken 3 days per week beginning 08/08/2011/ end 03/26/12. FOLLOWS FOR: Has noticed increased SOB with activity Cough is now mostly dry. Once or twice has seen tiny clot from back of throat.  Denies fever, sweat, nodes, chest pain, wheeze, edema.  Sputum Cx 03/17/12  Neg AFB smear and culture                                    Pos- broadly sens pseudomonas CXR 03/12/12- images and report reviewed with her.  IMPRESSION:  1. Lingular subsegmental atelectasis or scar.  2. Thoracic spondylosis and kyphosis.  3. Dextroconvex lumbar scoliosis.  4. Atherosclerosis.   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 Nodule, thyroid nodule, CAD. We started triple therapy with clarithromycin 250 mg x2, rifampin 300 mg x2 and Myambutol 400 mg x3, each taken 3 days per week beginning 08/08/2011/ end 03/26/12 FOLLOWS FOR: non productive cough and continues to notice SOB at times. Productive cough has been dry since Cipro for pseudomonas from 03/2012 sputum Cx. AFB was Neg. Occ slight wheeze. No fever, sweat, nodes or chest pain. CXR 05/19/12  IMPRESSION:  1. No acute cardiopulmonary findings.  2. Degenerative osteophytosis of the thoracic spine.  Original Report Authenticated By: Genevive BiStewart Edmunds, M.D.  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 Nodule,  Complicated by thyroid nodule, CAD, CKD. We started triple therapy with clarithromycin 250 mg x2, rifampin 300 mg x2 and Myambutol 400 mg x3, each taken 3 days per week beginning 08/08/2011/ End 03/26/12 FOLLOWS FOR: slight dizzy this morning "dont feel like I'm all here"; continues to have productive cough at times-yellow to gray in color. SOB at times as well. Not physically ill- chronic intermittent couh with occ mucus plug, no fever or blood. Followed by nephrologist for chronic kidney disease- says "worse". Main concern [place] to [place]- doesn't want to go.  CXR 05/12/12 IMPRESSION:  1. No acute cardiopulmonary findings.  2. Degenerative osteophytosis of the thoracic spine.  Original Report Authenticated By: Genevive BiStewart Edmunds, M.D.  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 Nodule,  Complicated by thyroid nodule, CAD, CKD. We started triple therapy with clarithromycin 250 mg x2, rifampin 300 mg x2 and Myambutol 400 mg x3, each taken 3 days per week beginning  08/08/2011/ End 03/26/12 Follows For: SOB on exertion - Prod cough ( occas yellow) - Wheezing Aware of some dyspnea on exertion without change, and tolerable Occasional scant wheeze or cough. No fever or sweat. Asks refill benzonatate.  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 Nodule,  Complicated by thyroid nodule, CAD, CKD. We started triple therapy with clarithromycin 250 mg x2, rifampin 300 mg x2 and Myambutol 400 mg x3, each taken 3 days per week beginning 08/08/2011/ End 03/26/12 FOLLOWS FOR:  Reports having cough with yellow mucus, sob due to coughing and some wheezing x 3-4 weeks.  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 Nodule,  Complicated by thyroid nodule, CAD, CKD. We started triple therapy with clarithromycin 250 mg x2, rifampin 300 mg x2 and Myambutol 400 mg x3, each taken 3 days per week beginning 08/08/2011/ End 03/26/12 FOLLOWS FOR: feels better since coughing up mucus-yellow in color. Continues to have productive cough. Last sputum cx Jan, 2015 +pseudomonas, Rx'd. Some cough occasionally, not concerned now. Scant phlegm. For last exacerbation we gave Zpak then biaxin-had  to stop after 2 days due to bad taste. Wheeze is gone. No fever/ sweat.  CXR 08/20/13 IMPRESSION:  No acute cardiopulmonary abnormality seen.  Electronically Signed  By: Roque Lias M.D.  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 Nodule,  Complicated by thyroid nodule, CAD, CKD. We started triple therapy with clarithromycin 250 mg x2, rifampin 300 mg x2 and Myambutol 400 mg x3, each taken 3 days per week beginning 08/08/2011/ End 03/26/12 FOLLOWS FOR: pt c/o sob with exertion, prod cough with green/yellow mucus.  Pt brought  sputum sample in case CY wants to test it- collected 1 week ago.  No fever, sweat, blood, nodes or chest pain.  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 Nodule,  Complicated by thyroid nodule, CAD, CKD. We started triple therapy with clarithromycin 250 mg x2, rifampin 300 mg x2 and Myambutol 400 mg x3, each taken 3 days per week beginning 08/08/2011/ End 03/26/12 FOLLOWS FOR: Pt states she has "warm" feeling inside as though she could use some"coldness inside". Pt felt tightness in chest-unable to catch her breath last night; would like to know if she needs O2 at home or what to do; unsure if any of this is related to anxiety.  She is anxious about upcoming hip replacement surgery. Hip pain is keeping her awake some at night. Little cough and no fever, sweats, adenopathy or purulent sputum.  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 Nodule,  Complicated by thyroid nodule, CAD, CKD. We started triple therapy with clarithromycin 250 mg x2, rifampin 300 mg x2 and Myambutol 400 mg x3, each taken 3 days per week beginning 08/08/2011/ End 03/26/12 Follows For: Pt c/o prod cough with yellow/brown mucus, occasional wheezing, SOB with exertion. Sinus pressure and drainage daily. Denies any chest congestion/tightness. Annoying persistent cough but little changed. Usually dry. Occasionally a harder episode of coughing takes her breath for minute. Noticed dyspnea with exertion visiting [place], at altitude. Recent doxycycline for a boil with no apparent effect on her chest. CXR 04/20/14 IMPRESSION: No active disease. Persistent mild right hilar prominence. Mass or adenopathy cannot be excluded. Follow-up nonemergent CT scan of the chest is recommended. Moderate size hiatal hernia. Electronically Signed  By: Natasha Mead M.D.  On: 04/20/2014 11:40  ROS-See HPI Constitutional:   No-   weight  loss, night sweats, fevers, chills, fatigue, lassitude. HEENT:   No-  headaches, difficulty swallowing, tooth/dental problems, sore throat,       No-  sneezing, itching, ear ache, nasal congestion, post nasal drip,  CV:  No-   chest pain, orthopnea, PND, swelling in lower extremities, anasarca, dizziness, palpitations Resp: +  shortness of breath with exertion, Not or at rest.              No-productive cough,  + non-productive cough,  No- coughing up of blood.             No-change in color of mucus.  No- wheezing.   Skin:   No- rash or lesions. GI:  No-heartburn, indigestion, no-abdominal pain, nausea, vomiting, GU: . MS:  No-   joint pain or swelling.   Neuro-     nothing unusual Psych:  No- change in mood or affect. No depression or anxiety.  No memory loss.  OBJ General- Alert, Oriented, Affect-appropriate, Distress- none acute; petite elderly woman. She appears well. Skin-  rash-none,excoriation- none.  Lymphadenopathy- none Head- atraumatic            Eyes- Gross vision intact, PERRLA, conjunctivae clear secretions            Ears- +hard of hearingl            Nose- Clear, no-Septal dev, mucus, polyps, erosion, perforation             Throat- Mallampati II , mucosa clear , drainage- none  tonsils- atrophic Neck- flexible , trachea midline, no stridor , thyroid+ nodule R, carotid no bruit Chest - symmetrical excursion , unlabored           Heart/CV- RRR , no murmur , no gallop  , no rub, nl s1 s2                           - JVD- none , edema- none, stasis changes- none, varices- none           Lung- + bilateral mild rhonchi, unlabored, cough + slight with deep breath , dullness-none, rub- none           Chest wall- kyphosis Abd-  Br/ Gen/ Rectal- Not done, not indicated Extrem- cyanosis- none, clubbing, none, atrophy- none, strength- nl Neuro- grossly intact to observation

## 2014-09-02 NOTE — Patient Instructions (Addendum)
Script sent for Zpak antibiotic  Order- CXR-   Dx chronic bronchitis with exacerbation  Sample Anoro Ellipta   1 puff, once daily  Try this instead of Spriva. When the sample runs out, go back to Spiriva for comparison

## 2014-09-03 NOTE — Assessment & Plan Note (Addendum)
I don't think this is an exacerbation. Plan-chest x-ray, Z-Pak. Consider sputum culture if no improvement, sample Anoro inhaler for trial.

## 2014-09-30 ENCOUNTER — Telehealth: Payer: Self-pay | Admitting: Internal Medicine

## 2014-09-30 MED ORDER — UMECLIDINIUM-VILANTEROL 62.5-25 MCG/INH IN AEPB: 1.0000 | INHALATION_SPRAY | Freq: Every day | RESPIRATORY_TRACT | Status: DC

## 2014-09-30 NOTE — Telephone Encounter (Signed)
Patient was given sample of Anoro at last Ov and she said that it works well and she would like RX sent to pharmacy. Rx sent. Nothing further needed.

## 2014-10-20 ENCOUNTER — Telehealth: Payer: Self-pay | Admitting: Internal Medicine

## 2014-10-20 MED ORDER — IPRATROPIUM BROMIDE 0.06 % NA SOLN: 2.0000 | Freq: Four times a day (QID) | NASAL | Status: DC

## 2014-10-20 NOTE — Telephone Encounter (Signed)
Called pt and is aware RX sent in. Nothing further needed 

## 2014-10-21 ENCOUNTER — Telehealth: Payer: Self-pay | Admitting: Internal Medicine

## 2014-10-21 MED ORDER — IPRATROPIUM BROMIDE 0.06 % NA SOLN: 2.0000 | Freq: Four times a day (QID) | NASAL | Status: DC

## 2014-10-21 NOTE — Telephone Encounter (Signed)
Rx was sent to wrong pharmacy. Resubmitted Rx to [facility] Patient notified. Nothing further needed.

## 2014-10-31 ENCOUNTER — Telehealth: Payer: Self-pay | Admitting: Internal Medicine

## 2014-10-31 NOTE — Telephone Encounter (Signed)
lmtcb x1 

## 2014-11-01 NOTE — Telephone Encounter (Signed)
Called spoke with pt. She reports she received 9 box of ipratropium nasal spray. I advised her according to her chart she does 2 sprays up to 4 times a day as needed.  Per pt this is not how she takes it. She wants the directions changed to 2 puffs once a day PRN. I have fixed this in her chart for the future. Nothing further needed

## 2014-11-21 ENCOUNTER — Ambulatory Visit (INDEPENDENT_AMBULATORY_CARE_PROVIDER_SITE_OTHER): Payer: Medicare Other | Admitting: Internal Medicine

## 2014-11-21 ENCOUNTER — Encounter: Payer: Self-pay | Admitting: Internal Medicine

## 2014-11-21 VITALS — BP 102/58 | HR 71 | Ht 59.0 in | Wt 106.8 lb

## 2014-11-21 DIAGNOSIS — J449 Chronic obstructive pulmonary disease, unspecified: Secondary | ICD-10-CM

## 2014-11-21 DIAGNOSIS — A319 Mycobacterial infection, unspecified: Secondary | ICD-10-CM

## 2014-11-21 DIAGNOSIS — J471 Bronchiectasis with (acute) exacerbation: Secondary | ICD-10-CM

## 2014-11-21 DIAGNOSIS — A310 Pulmonary mycobacterial infection: Secondary | ICD-10-CM

## 2014-11-21 DIAGNOSIS — J4489 Other specified chronic obstructive pulmonary disease: Secondary | ICD-10-CM

## 2014-11-21 MED ORDER — AMOXICILLIN 500 MG PO TABS: ORAL_TABLET | ORAL | Status: DC

## 2014-11-21 MED ORDER — BENZONATATE 100 MG PO CAPS: 100.0000 mg | ORAL_CAPSULE | Freq: Four times a day (QID) | ORAL | Status: DC | PRN

## 2014-11-21 NOTE — Assessment & Plan Note (Signed)
Concern for possible recurrence. Plan-sputum culture, chest x-ray

## 2014-11-21 NOTE — Assessment & Plan Note (Signed)
Exacerbation of chronic bronchitis. Question if this could be recurrent atypical AFB Plan-sputum for culture, amoxicillin, refill benzonatate with prescriptions for local and mail order

## 2014-11-21 NOTE — Progress Notes (Signed)
12/17/10- 81 year old female former smoker, retired Charity fundraiserN, referred courtesy of Dr. Wende [place]anone [place] [facility] because of COPD. She had smoked 2 packs per day for 26 years/52 pack years, ending in 1984. [name] assumes the diagnosis is COPD. She had seen Dr.DeCoy/Pulmonary in the past before he left town. He treated her with Spiriva which has helped. Symbicort 160 has not helped. She is aware of progressive dyspnea on exertion over the last several years. Has had episodes of bronchitis. Daily cough brings yellow and sometimes green or brown sputum. She denies blood. She was hospitalized once with pneumonia but not for other lung problems. She has not had home oxygen. Has nebulizer machine at home but is not using it. Went to an urgent care this summer for bronchitis, treated with prednisone and an antibiotic. Cardiac problems limited to occasional rapid heartbeat. Anemia treated with iron. Has had 3 spinal steroid injections and is followed by a spine specialist. 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 Intolerant of aspirin and Avelox. Asks flu shot. Has had pneumococcal vaccine but not sure of the date.  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 She had smoked 2 packs per day for 26 years/52 pack years, ending in 1984. Has had flu and pneumonia vaccines. Since last here she had fallen and hit her head with bruising. Has aseptic necrosis of the right hip and is seeing Dr.Took/ Ortho, but does not want surgery. Occasional cough and persistent throat clearing, some wheeze and a few night sweats. She remembers getting a nebulizer treatment once which gave ability to clear mucus from her airways. She called a television advertisement and got a nebulizer machine and medication from an out of state company. She has used  it only once and got little sputum up with that trial. CT 12/20/10- I reviewed images with her- IMPRESSION:  1. Pulmonary parenchymal pattern of diffuse mild bronchiectasis  and peribronchovascular nodularity is indicative of mycobacterium  avium complex (MAC).  2. Vague area of irregular nodularity in the right lower lobe  (image 32). While this is likely within the spectrum of presumed  MAC, a pulmonary nodule cannot be definitively excluded. If the  patient is at high risk for bronchogenic carcinoma, follow-up chest  CT at 3-6 months is recommended. If the patient is at low risk for  bronchogenic carcinoma, follow-up chest CT at 6-12 months is  recommended. This recommendation follows the consensus statement:  Guidelines for Management of Small Pulmonary Nodules Detected on CT  Scans: A Statement from the Fleischner Society as published in  Radiology 2005; 237:395-400. Online at:  DietDisorder.czhttp://www.med.[employer].edu/rad/res/Fleischner-nodule.htm.  3. Dominant right thyroid nodule with associated calcifications.  Thyroid ultrasound is recommended in further evaluation, as  clinically indicated.  4. Coronary artery calcification.  Original  Report Authenticated By: Reyes IvanMELINDA A. BLIETZ, M.D.   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 MAIC, ? Nodule, thyroid nodule, CAD. C/O sob with exertion, occasional wheezing, and feels like there is a band around her chest Had right hip replacement which relieved her pain. Chest feels okay now. Not coughing. Does bring up a little clear mucus occasionally, no blood. Denies fever or sweat. She is aware she has a chronic right thyroid nodule. CT scan of chest from 12/20/2010 was reviewed last visit. Sputum from 02/01/2011 was  [facility]. We have discussed indications for treatment and the option of continued observation instead of antibacterial treatment for now.  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 Nodule, thyroid nodule, CAD. Cough-productive-yellow/gray in color-noticed its happening more often than about 6 months ago. SOB with long walks.  Since last here, she had right total hip replacement in February and is doing very well. Variable cough produces thick sputum, mostly white. She denies night sweats or fever. Sneezing this spring controlled with OTC antihistamine. CT chest 07/12/11-  IMPRESSION:  1. Pulmonary parenchymal pattern of diffuse peribronchovascular  nodularity, bronchiectasis and scattered mucoid impaction is  indicative of mycobacterium avium complex (MAC).  2. Right lower lobe nodular density is unchanged and likely within  the spectrum of presumed MAC. If further follow-up is desired, CT  chest without contrast in 12 months is recommended.  3. Dominant right thyroid nodule, as before. Recommend further  evaluation with thyroid ultrasound. If patient is clinically  hyperthyroid, consider nuclear medicine thyroid uptake and scan.  Original Report Authenticated By: Reyes IvanMELINDA A. BLIETZ, M.D.   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 Nodule, thyroid nodule, CAD. Feels as though she is wheezing at times in her thoat area. Hoarseness . [facility] Nov, 2012. We started triple therapy with clarithromycin 250 mg x2, rifampin 300 mg x2 and Myambutol 400 mg x3, each taken 3 days per week beginning 08/08/2011 so she has not had 2 months of therapy. She complains of throat tickle and some sneeze. She has been coughing for more than 6 months. Uses tramadol once daily with 2 Tylenol. She declines cough syrup. Chronic back pain/physical therapy.  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 Nodule, thyroid nodule, CAD. We started triple therapy with clarithromycin 250 mg x2, rifampin 300 mg x2 and Myambutol 400 mg  x3, each taken 3 days per week beginning 08/08/2011 Increase in SOB and wheezing since last visit. Still with some of the prednisone taper we gave. Now persistent sense of something in her throat and feels wheeze originating in her throat. Had some laryngitis. She does feel reflux despite Pepcid. Annoying watery rhinorrhea COPD assessment test (CAT) score 10/24 CXR- 10/10/11 IMPRESSION:  1. No radiographic evidence of acute cardiopulmonary disease.  Original Report Authenticated By: Florencia ReasonsANIEL W. ENTRIKIN, M.D.   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 Nodule, thyroid nodule, CAD. We started triple therapy with clarithromycin 250 mg x2, rifampin 300 mg x2 and Myambutol 400 mg x3, each taken 3 days per week beginning 08/08/2011 FOLLOWS FOR: no more SOB and wheezing than at last visit; cough-productive Denies night sweats or fever. Sputum is mostly clear. CXR 8/6 / 13- to watch right lower lung zone for possible atelectasis IMPRESSION:  1. No radiographic evidence of acute cardiopulmonary disease.  Original Report Authenticated By: Florencia ReasonsANIEL W. ENTRIKIN, M.D.  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 Nodule, thyroid nodule, CAD. We started triple therapy with clarithromycin 250 mg x2, rifampin 300 mg x2 and Myambutol 400 mg x3, each taken 3 days per week beginning 08/08/2011/ end 03/26/12. FOLLOWS FOR: Has noticed increased SOB with activity Cough is now mostly dry. Once or twice has seen tiny clot from back of throat.  Denies fever, sweat, nodes, chest pain, wheeze, edema.  Sputum Cx 03/17/12  Neg AFB smear and culture                                    Pos- broadly sens pseudomonas CXR 03/12/12- images and report reviewed with her.  IMPRESSION:  1. Lingular subsegmental atelectasis or scar.  2. Thoracic spondylosis and kyphosis.  3. Dextroconvex lumbar scoliosis.  4. Atherosclerosis.   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 Nodule, thyroid nodule, CAD. We started triple therapy with clarithromycin 250 mg x2, rifampin 300 mg x2 and Myambutol 400 mg x3, each taken 3 days per week beginning 08/08/2011/ end 03/26/12 FOLLOWS FOR: non productive cough and continues to notice SOB at times. Productive cough has been dry since Cipro for pseudomonas from 03/2012 sputum Cx. AFB was Neg. Occ slight wheeze. No fever, sweat, nodes or chest pain. CXR 05/19/12  IMPRESSION:  1. No acute cardiopulmonary findings.  2. Degenerative osteophytosis of the thoracic spine.  Original Report Authenticated By: Genevive Bi, M.D.  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 Nodule,  Complicated by thyroid nodule, CAD, CKD. We started triple therapy with clarithromycin 250 mg x2, rifampin 300 mg x2 and Myambutol 400 mg x3, each taken 3 days per week beginning 08/08/2011/ End 03/26/12 FOLLOWS FOR: slight dizzy this morning "dont feel like I'm all here"; continues to have productive cough at times-yellow to gray in color. SOB at times as well. Not physically ill- chronic intermittent couh with occ mucus plug, no fever or blood. Followed by nephrologist for chronic kidney disease- says "worse". Main concern family pressuring her to travel to [place]- doesn't want to go.  CXR 05/12/12 IMPRESSION:  1. No acute cardiopulmonary findings.  2. Degenerative osteophytosis of the thoracic spine.  Original Report Authenticated By: Genevive Bi, M.D.  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 Nodule,  Complicated by thyroid nodule, CAD, CKD. We started triple therapy with clarithromycin 250 mg x2, rifampin 300 mg x2 and Myambutol 400 mg x3, each taken 3 days per week beginning  08/08/2011/ End 03/26/12 Follows For: SOB on exertion - Prod cough ( occas yellow) - Wheezing Aware of some dyspnea on exertion without change, and tolerable Occasional scant wheeze or cough. No fever or sweat. Asks refill benzonatate.  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 Nodule,  Complicated by thyroid nodule, CAD, CKD. We started triple therapy with clarithromycin 250 mg x2, rifampin 300 mg x2 and Myambutol 400 mg x3, each taken 3 days per week beginning 08/08/2011/ End 03/26/12 FOLLOWS FOR:  Reports having cough with yellow mucus, sob due to coughing and some wheezing x 3-4 weeks.  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 Nodule,  Complicated by thyroid nodule, CAD, CKD. We started triple therapy with clarithromycin 250 mg x2, rifampin 300 mg x2 and Myambutol 400 mg x3, each taken 3 days per week beginning 08/08/2011/ End 03/26/12 FOLLOWS FOR: feels better since coughing up mucus-yellow in color. Continues to have productive cough. Last sputum cx Jan, 2015 +pseudomonas, Rx'd. Some cough occasionally, not concerned now. Scant phlegm. For last exacerbation we gave Zpak then biaxin-had  to stop after 2 days due to bad taste. Wheeze is gone. No fever/ sweat.  CXR 08/20/13 IMPRESSION:  No acute cardiopulmonary abnormality seen.  Electronically Signed  By: Roque Lias M.D.  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 Nodule,  Complicated by thyroid nodule, CAD, CKD. We started triple therapy with clarithromycin 250 mg x2, rifampin 300 mg x2 and Myambutol 400 mg x3, each taken 3 days per week beginning 08/08/2011/ End 03/26/12 FOLLOWS FOR: pt c/o sob with exertion, prod cough with green/yellow mucus.  Pt brought  sputum sample in case CY wants to test it- collected 1 week ago.  No fever, sweat, blood, nodes or chest pain.  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 Nodule,  Complicated by thyroid nodule, CAD, CKD. We started triple therapy with clarithromycin 250 mg x2, rifampin 300 mg x2 and Myambutol 400 mg x3, each taken 3 days per week beginning 08/08/2011/ End 03/26/12 FOLLOWS FOR: Pt states she has "warm" feeling inside as though she could use some"coldness inside". Pt felt tightness in chest-unable to catch her breath last night; would like to know if she needs O2 at home or what to do; unsure if any of this is related to anxiety.  She is anxious about upcoming hip replacement surgery. Hip pain is keeping her awake some at night. Little cough and no fever, sweats, adenopathy or purulent sputum.  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 Nodule,  Complicated by thyroid nodule, CAD, CKD. We started triple therapy with clarithromycin 250 mg x2, rifampin 300 mg x2 and Myambutol 400 mg x3, each taken 3 days per week beginning 08/08/2011/ End 03/26/12 Follows For: Pt c/o prod cough with yellow/brown mucus, occasional wheezing, SOB with exertion. Sinus pressure and drainage daily. Denies any chest congestion/tightness. Annoying persistent cough but little changed. Usually dry. Occasionally a harder episode of coughing takes her breath for minute. Noticed dyspnea with exertion visiting [place], at altitude. Recent doxycycline for a boil with no apparent effect on her chest. CXR 04/20/14 IMPRESSION: No active disease. Persistent mild right hilar prominence. Mass or adenopathy cannot be excluded. Follow-up nonemergent CT scan of the chest is recommended. Moderate size hiatal hernia. Electronically Signed  By: Natasha Mead M.D.  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 Nodule,  Complicated by thyroid nodule, CAD, CKD. We started triple therapy with clarithromycin 250 mg x2, rifampin 300 mg x2 and Myambutol 400 mg x3, each taken 3 days per week beginning 08/08/2011/ End 03/26/12 FOLLOWS FOR: Pt states she Anoro did not help- pt no longer taking. Pt states her breathing has slightly worsened since last OV. Pt c/o increase in SOB, non prod cough, chest congestion x 2 weeks. Pt denies f/c/s, CP/tightness.  Persistent scant yellow sputum with no blood, fever or sore throat. Increased cough in the past month. She doesn't remember if Z-Pak in July helped her. Wants to wait on flu shot to later. CXR 09/02/14 :No acute abnormality. Emphysema. Aortic atherosclerosis. Electronically Signed  By: Francene Boyers M.D.  On: 09/02/2014 12:46  ROS-See HPI Constitutional:   No-   weight loss, night sweats, fevers, chills, fatigue, lassitude. HEENT:   No-  headaches, difficulty swallowing, tooth/dental problems, sore throat,       No-  sneezing, itching, ear ache, nasal congestion, post nasal  drip,  CV:  No-   chest pain, orthopnea, PND, swelling in lower extremities, anasarca, dizziness, palpitations Resp: +  shortness of breath with exertion, Not or at rest.           + cough,  + non-productive cough,  No- coughing up of blood.            + in color of mucus.  No- wheezing.   Skin:   No- rash or lesions. GI:  No-heartburn, indigestion, no-abdominal pain, nausea, vomiting, GU: . MS:  No-   joint pain or swelling.   Neuro-     nothing unusual Psych:  No- change in mood or affect. No depression or anxiety.  No memory loss.  OBJ General- Alert, Oriented, Affect-appropriate, Distress- none acute; petite elderly woman. She appears well. Skin- rash-none,excoriation- none.  Lymphadenopathy- none Head- atraumatic            Eyes- Gross vision intact, PERRLA, conjunctivae clear secretions            Ears- +hard of  hearingl            Nose- Clear, no-Septal dev, mucus, polyps, erosion, perforation             Throat- Mallampati II , mucosa clear , drainage- none  tonsils- atrophic Neck- flexible , trachea midline, no stridor , thyroid+ nodule R, carotid no bruit Chest - symmetrical excursion , unlabored           Heart/CV- RRR , no murmur , no gallop  , no rub, nl s1 s2                           - JVD- none , edema- none, stasis changes- none, varices- none           Lung- + bilateral mild rhonchi, unlabored, cough + slight with deep breath , dullness-none, rub- none           Chest wall- kyphosis Abd-  Br/ Gen/ Rectal- Not done, not indicated Extrem- cyanosis- none, clubbing, none, atrophy- none, strength- nl Neuro- grossly intact to observation

## 2014-11-21 NOTE — Patient Instructions (Signed)
Order- lab- sputum culture and sens   Routine and AFB     Dx bronchiectasis  Script sent for amoxacillin to local drug store   Script sent locally for benzonatate perles  Script sent to mail-order for benzonatate

## 2014-11-28 ENCOUNTER — Other Ambulatory Visit: Payer: Medicare Other

## 2014-11-28 DIAGNOSIS — J471 Bronchiectasis with (acute) exacerbation: Secondary | ICD-10-CM

## 2014-12-01 LAB — RESPIRATORY CULTURE OR RESPIRATORY AND SPUTUM CULTURE
GRAM STAIN: NONE SEEN
Gram Stain: NONE SEEN

## 2014-12-12 ENCOUNTER — Ambulatory Visit: Payer: Medicare Other | Admitting: Internal Medicine

## 2014-12-15 ENCOUNTER — Telehealth: Payer: Self-pay | Admitting: *Deleted

## 2014-12-15 NOTE — Telephone Encounter (Signed)
Received a call report on pt AFB sputum culture. [facility]. This will be faxed over. Dr. Maple HudsonYoung is out of the office until next Wednesday. Will forward to DOD. Please advise Dr. Kendrick FriesMcQuaid thanks

## 2014-12-15 NOTE — Telephone Encounter (Signed)
To Dr. Maple HudsonYoung.  Copy of report put on Dr. Roxy CedarYoung's cart.

## 2014-12-15 NOTE — Telephone Encounter (Signed)
Noted, Please make sure a copy of the report is available for Dr. Maple HudsonYoung next week

## 2014-12-21 NOTE — Telephone Encounter (Signed)
LVM for pt to return call

## 2014-12-21 NOTE — Telephone Encounter (Signed)
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 She had diarrhea and bad taste in her mouth from full dose biaxin 50o mg last time. Would she be willing to try taking biaxin 250 mg twice a day, every other day, for 3 months (# 30, ref x 2) to see if she could tolerate that better, taken after food?

## 2014-12-22 ENCOUNTER — Telehealth: Payer: Self-pay | Admitting: Internal Medicine

## 2014-12-22 NOTE — Telephone Encounter (Signed)
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 She had diarrhea and bad taste in her mouth from full dose biaxin 250 mg last time. Would she be willing to try taking biaxin 250 mg twice a day, every other day, for 3 months (# 30, ref x 2) to see if she could tolerate that better, taken after food? ------------- Pt is aware of CY's recommendations. She states that "if this is that nasty tasting stuff, I'm not taking it." Advised her that I do not know what Biaxin tastes like. She continued to say, "if this is that nasty tasting stuff, I'm not taking it." Also stated that CY would know what she was talking about.  CY - please advise. Thanks.

## 2014-12-23 NOTE — Telephone Encounter (Signed)
lmtcb

## 2014-12-23 NOTE — Telephone Encounter (Signed)
We don't have a lot of medication options. I have asked our research team to see if she would be a candidate for a new treatment that we are testing.

## 2014-12-23 NOTE — Telephone Encounter (Signed)
CY - pls advise.

## 2014-12-26 NOTE — Telephone Encounter (Signed)
Deanna CitizenCaroline or Deanna ArnoldStacy in Research would be the contact people.

## 2014-12-26 NOTE — Telephone Encounter (Signed)
Called made pt aware. She would like a call once the research team has decided if she is a candidate or not. Katie, do you know whom I need to f/u with this about?

## 2014-12-27 NOTE — Telephone Encounter (Signed)
Spoke with Deanna JonesCarolyn about this, she states that Deanna DillsStacy [name] is working on this pt's pre-screening process for research and would be in contact with the pt regarding this.  Nothing further needed at this time.

## 2015-01-08 LAB — AFB CULTURE WITH SMEAR (NOT AT ARMC): Acid Fast Smear: NONE SEEN

## 2015-03-22 ENCOUNTER — Encounter: Payer: Self-pay | Admitting: Internal Medicine

## 2015-03-22 ENCOUNTER — Ambulatory Visit (INDEPENDENT_AMBULATORY_CARE_PROVIDER_SITE_OTHER)
Admission: RE | Admit: 2015-03-22 | Discharge: 2015-03-22 | Disposition: A | Discharge status: Home / Self Care | Payer: Medicare Other | Source: Ambulatory Visit | Attending: Internal Medicine | Admitting: Internal Medicine

## 2015-03-22 ENCOUNTER — Ambulatory Visit (INDEPENDENT_AMBULATORY_CARE_PROVIDER_SITE_OTHER): Payer: Medicare Other | Admitting: Internal Medicine

## 2015-03-22 VITALS — BP 108/82 | HR 74 | Ht <= 58 in | Wt 109.4 lb

## 2015-03-22 DIAGNOSIS — J479 Bronchiectasis, uncomplicated: Secondary | ICD-10-CM | POA: Diagnosis not present

## 2015-03-22 DIAGNOSIS — J449 Chronic obstructive pulmonary disease, unspecified: Secondary | ICD-10-CM | POA: Diagnosis not present

## 2015-03-22 DIAGNOSIS — A319 Mycobacterial infection, unspecified: Secondary | ICD-10-CM

## 2015-03-22 DIAGNOSIS — A310 Pulmonary mycobacterial infection: Secondary | ICD-10-CM

## 2015-03-22 MED ORDER — ALBUTEROL SULFATE HFA 108 (90 BASE) MCG/ACT IN AERS: 2.0000 | INHALATION_SPRAY | Freq: Four times a day (QID) | RESPIRATORY_TRACT | Status: DC | PRN

## 2015-03-22 MED ORDER — TIOTROPIUM BROMIDE MONOHYDRATE 18 MCG IN CAPS: 18.0000 ug | ORAL_CAPSULE | Freq: Every day | RESPIRATORY_TRACT | Status: DC

## 2015-03-22 NOTE — Progress Notes (Signed)
12/17/10- 82 year old female former smoker, retired [employer] fund[employer], referred courtesy of [place]. [place]anone [place] [facility] because of COPD. She had smoked 2 packs per day for 26 years/52 pack years, ending in 1984. [name] assumes the diagnosis is COPD. She had seen Dr.DeCoy/Pulmonary in the past before he left town. He treated her with Spiriva which has helped. Symbicort 160 has not helped. She is aware of progressive dyspnea on exertion over the last several years. Has had episodes of bronchitis. Daily cough brings yellow and sometimes green or brown sputum. She denies blood. She was hospitalized once with pneumonia but not for other lung problems. She has not had home oxygen. Has nebulizer machine at home but is not using it. Went to an urgent care this summer for bronchitis, treated with prednisone and an antibiotic. Cardiac problems limited to occasional rapid heartbeat. Anemia treated with iron. Has had 3 spinal steroid injections and is followed by a spine specialist. 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 Intolerant of aspirin and Avelox. Asks flu shot. Has had pneumococcal vaccine but not sure of the date.  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 She had smoked 2 packs per day for 26 years/52 pack years, ending in 1984. Has had flu and pneumonia vaccines. Since last here she had fallen and hit her head with bruising. Has aseptic necrosis of the right hip and is seeing Dr.Took/ Ortho, but does not want surgery. Occasional cough and persistent throat clearing, some wheeze and a few night sweats. She remembers getting a nebulizer treatment once which gave ability to clear mucus from her airways. She called a television advertisement and got a nebulizer machine and medication from an out of state company. She has used  it only once and got little sputum up with that trial. CT 12/20/10- I reviewed images with her- IMPRESSION:  1. Pulmonary parenchymal pattern of diffuse mild bronchiectasis  and peribronchovascular nodularity is indicative of mycobacterium  avium complex (MAC).  2. Vague area of irregular nodularity in the right lower lobe  (image 32). While this is likely within the spectrum of presumed  MAC, a pulmonary nodule cannot be definitively excluded. If the  patient is at high risk for bronchogenic carcinoma, follow-up chest  CT at 3-6 months is recommended. If the patient is at low risk for  bronchogenic carcinoma, follow-up chest CT at 6-12 months is  recommended. This recommendation follows the consensus statement:  Guidelines for Management of Small Pulmonary Nodules Detected on CT  Scans: A Statement from the Fleischner Society as published in  Radiology 2005; 237:395-400. Online at:  DietDisorder.czhttp://www.med.[employer].edu/rad/res/Fleischner-nodule.htm.  3. Dominant right thyroid nodule with associated calcifications.  Thyroid ultrasound is recommended in further evaluation, as  clinically indicated.  4. Coronary artery calcification.  Original  Report Authenticated By: Reyes IvanMELINDA A. BLIETZ, M.D.   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 MAIC, ? Nodule, thyroid nodule, CAD. C/O sob with exertion, occasional wheezing, and feels like there is a band around her chest Had right hip replacement which relieved her pain. Chest feels okay now. Not coughing. Does bring up a little clear mucus occasionally, no blood. Denies fever or sweat. She is aware she has a chronic right thyroid nodule. CT scan of chest from 12/20/2010 was reviewed last visit. Sputum from 02/01/2011 was  [facility]. We have discussed indications for treatment and the option of continued observation instead of antibacterial treatment for now.  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 Nodule, thyroid nodule, CAD. Cough-productive-yellow/gray in color-noticed its happening more often than about 6 months ago. SOB with long walks.  Since last here, she had right total hip replacement in February and is doing very well. Variable cough produces thick sputum, mostly white. She denies night sweats or fever. Sneezing this spring controlled with OTC antihistamine. CT chest 07/12/11-  IMPRESSION:  1. Pulmonary parenchymal pattern of diffuse peribronchovascular  nodularity, bronchiectasis and scattered mucoid impaction is  indicative of mycobacterium avium complex (MAC).  2. Right lower lobe nodular density is unchanged and likely within  the spectrum of presumed MAC. If further follow-up is desired, CT  chest without contrast in 12 months is recommended.  3. Dominant right thyroid nodule, as before. Recommend further  evaluation with thyroid ultrasound. If patient is clinically  hyperthyroid, consider nuclear medicine thyroid uptake and scan.  Original Report Authenticated By: Reyes IvanMELINDA A. BLIETZ, M.D.   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 Nodule, thyroid nodule, CAD. Feels as though she is wheezing at times in her thoat area. Hoarseness . [facility] Pos [facility] Nov, 2012. We started triple therapy with clarithromycin 250 mg x2, rifampin 300 mg x2 and Myambutol 400 mg x3, each taken 3 days per week beginning 08/08/2011 so she has not had 2 months of therapy. She complains of throat tickle and some sneeze. She has been coughing for more than 6 months. Uses tramadol once daily with 2 Tylenol. She declines cough syrup. Chronic back pain/physical therapy.  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 Nodule, thyroid nodule, CAD. We started triple therapy with clarithromycin 250 mg x2, rifampin 300 mg x2 and Myambutol 400 mg  x3, each taken 3 days per week beginning 08/08/2011 Increase in SOB and wheezing since last visit. Still with some of the prednisone taper we gave. Now persistent sense of something in her throat and feels wheeze originating in her throat. Had some laryngitis. She does feel reflux despite Pepcid. Annoying watery rhinorrhea COPD assessment test (CAT) score 10/24 CXR- 10/10/11 IMPRESSION:  1. No radiographic evidence of acute cardiopulmonary disease.  Original Report Authenticated By: Florencia ReasonsANIEL W. ENTRIKIN, M.D.   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 Nodule, thyroid nodule, CAD. We started triple therapy with clarithromycin 250 mg x2, rifampin 300 mg x2 and Myambutol 400 mg x3, each taken 3 days per week beginning 08/08/2011 FOLLOWS FOR: no more SOB and wheezing than at last visit; cough-productive Denies night sweats or fever. Sputum is mostly clear. CXR 8/6 / 13- to watch right lower lung zone for possible atelectasis IMPRESSION:  1. No radiographic evidence of acute cardiopulmonary disease.  Original Report Authenticated By: Florencia ReasonsANIEL W. ENTRIKIN, M.D.  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 Nodule, thyroid nodule, CAD. We started triple therapy with clarithromycin 250 mg x2, rifampin 300 mg x2 and Myambutol 400 mg x3, each taken 3 days per week beginning 08/08/2011/ end 03/26/12. FOLLOWS FOR: Has noticed increased SOB with activity Cough is now mostly dry. Once or twice has seen tiny clot from back of throat.  Denies fever, sweat, nodes, chest pain, wheeze, edema.  Sputum Cx 03/17/12  Neg AFB smear and culture                                    Pos- broadly sens pseudomonas CXR 03/12/12- images and report reviewed with her.  IMPRESSION:  1. Lingular subsegmental atelectasis or scar.  2. Thoracic spondylosis and kyphosis.  3. Dextroconvex lumbar scoliosis.  4. Atherosclerosis.   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 Nodule, thyroid nodule, CAD. We started triple therapy with clarithromycin 250 mg x2, rifampin 300 mg x2 and Myambutol 400 mg x3, each taken 3 days per week beginning 08/08/2011/ end 03/26/12 FOLLOWS FOR: non productive cough and continues to notice SOB at times. Productive cough has been dry since Cipro for pseudomonas from 03/2012 sputum Cx. AFB was Neg. Occ slight wheeze. No fever, sweat, nodes or chest pain. CXR 05/19/12  IMPRESSION:  1. No acute cardiopulmonary findings.  2. Degenerative osteophytosis of the thoracic spine.  Original Report Authenticated By: Genevive Bi, M.D.  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 Nodule,  Complicated by thyroid nodule, CAD, CKD. We started triple therapy with clarithromycin 250 mg x2, rifampin 300 mg x2 and Myambutol 400 mg x3, each taken 3 days per week beginning 08/08/2011/ End 03/26/12 FOLLOWS FOR: slight dizzy this morning "dont feel like I'm all here"; continues to have productive cough at times-yellow to gray in color. SOB at times as well. Not physically ill- chronic intermittent couh with occ mucus plug, no fever or blood. Followed by nephrologist for chronic kidney disease- says "worse". Main concern family pressuring her to travel to [place]- doesn't want to go.  CXR 05/12/12 IMPRESSION:  1. No acute cardiopulmonary findings.  2. Degenerative osteophytosis of the thoracic spine.  Original Report Authenticated By: Genevive Bi, M.D.  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 Nodule,  Complicated by thyroid nodule, CAD, CKD. We started triple therapy with clarithromycin 250 mg x2, rifampin 300 mg x2 and Myambutol 400 mg x3, each taken 3 days per week beginning  08/08/2011/ End 03/26/12 Follows For: SOB on exertion - Prod cough ( occas yellow) - Wheezing Aware of some dyspnea on exertion without change, and tolerable Occasional scant wheeze or cough. No fever or sweat. Asks refill benzonatate.  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 Nodule,  Complicated by thyroid nodule, CAD, CKD. We started triple therapy with clarithromycin 250 mg x2, rifampin 300 mg x2 and Myambutol 400 mg x3, each taken 3 days per week beginning 08/08/2011/ End 03/26/12 FOLLOWS FOR:  Reports having cough with yellow mucus, sob due to coughing and some wheezing x 3-4 weeks.  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 Nodule,  Complicated by thyroid nodule, CAD, CKD. We started triple therapy with clarithromycin 250 mg x2, rifampin 300 mg x2 and Myambutol 400 mg x3, each taken 3 days per week beginning 08/08/2011/ End 03/26/12 FOLLOWS FOR: feels better since coughing up mucus-yellow in color. Continues to have productive cough. Last sputum cx Jan, 2015 +pseudomonas, Rx'd. Some cough occasionally, not concerned now. Scant phlegm. For last exacerbation we gave Zpak then biaxin-had  to stop after 2 days due to bad taste. Wheeze is gone. No fever/ sweat.  CXR 08/20/13 IMPRESSION:  No acute cardiopulmonary abnormality seen.  Electronically Signed  By: Roque Lias M.D.  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 Nodule,  Complicated by thyroid nodule, CAD, CKD. We started triple therapy with clarithromycin 250 mg x2, rifampin 300 mg x2 and Myambutol 400 mg x3, each taken 3 days per week beginning 08/08/2011/ End 03/26/12 FOLLOWS FOR: pt c/o sob with exertion, prod cough with green/yellow mucus.  Pt brought  sputum sample in case CY wants to test it- collected 1 week ago.  No fever, sweat, blood, nodes or chest pain.  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 Nodule,  Complicated by thyroid nodule, CAD, CKD. We started triple therapy with clarithromycin 250 mg x2, rifampin 300 mg x2 and Myambutol 400 mg x3, each taken 3 days per week beginning 08/08/2011/ End 03/26/12 FOLLOWS FOR: Pt states she has "warm" feeling inside as though she could use some"coldness inside". Pt felt tightness in chest-unable to catch her breath last night; would like to know if she needs O2 at home or what to do; unsure if any of this is related to anxiety.  She is anxious about upcoming hip replacement surgery. Hip pain is keeping her awake some at night. Little cough and no fever, sweats, adenopathy or purulent sputum.  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 Nodule,  Complicated by thyroid nodule, CAD, CKD. We started triple therapy with clarithromycin 250 mg x2, rifampin 300 mg x2 and Myambutol 400 mg x3, each taken 3 days per week beginning 08/08/2011/ End 03/26/12 Follows For: Pt c/o prod cough with yellow/brown mucus, occasional wheezing, SOB with exertion. Sinus pressure and drainage daily. Denies any chest congestion/tightness. Annoying persistent cough but little changed. Usually dry. Occasionally a harder episode of coughing takes her breath for minute. Noticed dyspnea with exertion visiting [place], at altitude. Recent doxycycline for a boil with no apparent effect on her chest. CXR 04/20/14 IMPRESSION: No active disease. Persistent mild right hilar prominence. Mass or adenopathy cannot be excluded. Follow-up nonemergent CT scan of the chest is recommended. Moderate size hiatal hernia. Electronically Signed  By: Natasha Mead M.D.  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 Nodule,  Complicated by thyroid nodule, CAD, CKD. We started triple therapy with clarithromycin 250 mg x2, rifampin 300 mg x2 and Myambutol 400 mg x3, each taken 3 days per week beginning 08/08/2011/ End 03/26/12 FOLLOWS FOR: Pt states she Anoro did not help- pt no longer taking. Pt states her breathing has slightly worsened since last OV. Pt c/o increase in SOB, non prod cough, chest congestion x 2 weeks. Pt denies f/c/s, CP/tightness.  Persistent scant yellow sputum with no blood, fever or sore throat. Increased cough in the past month. She doesn't remember if Z-Pak in July helped her. Wants to wait on flu shot to later. CXR 09/02/14 :No acute abnormality. Emphysema. Aortic atherosclerosis. Electronically Signed  By: Francene Boyers M.D.  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 Nodule,  Complicated by thyroid nodule, CAD, CKD. We started triple therapy with clarithromycin 250 mg x2, rifampin 300 mg x2 and Myambutol 400 mg x3, each taken 3 days per  week beginning 08/08/2011/ End 03/26/12 Sputum AFB Cx + again for [facility] 11/28/14 FOLLOWS FOR:  Pt notes some increased SOB; "less space to breathe". Pt notes that she shakes when she attempts deep breaths. Pain noted shooting up left side of head.  She coughed more when she had a cold, now resolving. Scant sputum. She recognizes effect of her kyphosis  ROS-See HPI Constitutional:   No-   weight loss, night sweats, fevers, chills, fatigue, lassitude. HEENT:   No-  headaches, difficulty swallowing, tooth/dental problems, sore throat,       No-  sneezing, itching, ear ache, nasal congestion, post nasal drip,  CV:  No-   chest pain, orthopnea, PND, swelling in lower extremities, anasarca, dizziness, palpitations Resp: +  shortness of breath with exertion, Not or at rest.           + cough,  + non-productive cough,   No- coughing up of blood.             in color of mucus.  No- wheezing.   Skin:   No- rash or lesions. GI:  No-heartburn, indigestion, no-abdominal pain, nausea, vomiting, GU: . MS:  No-   joint pain or swelling.   Neuro-     nothing unusual Psych:  No- change in mood or affect. No depression or anxiety.  No memory loss.  OBJ General- Alert, Oriented, Affect-appropriate, Distress- none acute; petite elderly woman. She appears well. Skin- rash-none,excoriation- none.  Lymphadenopathy- none Head- atraumatic            Eyes- Gross vision intact, PERRLA, conjunctivae clear secretions            Ears- +hard of hearingl            Nose- Clear, no-Septal dev, mucus, polyps, erosion, perforation             Throat- Mallampati II , mucosa clear , drainage- none  tonsils- atrophic Neck- flexible , trachea midline, no stridor , thyroid+ nodule R, carotid no bruit Chest - symmetrical excursion , unlabored           Heart/CV- RRR , no murmur , no gallop  , no rub, nl s1 s2                           - JVD- none , edema- none, stasis changes- none, varices- none           Lung- + only trace rhonchi, unlabored, cough-none , dullness-none, rub- none           Chest wall-+ kyphosis Abd-  Br/ Gen/ Rectal- Not done, not indicated Extrem- cyanosis- none, clubbing, none, atrophy- none, strength- nl Neuro- grossly intact to observation

## 2015-03-22 NOTE — Patient Instructions (Addendum)
Order- CXR     Dx Bronchiectasis, MAIC   Refill scripts sent for Spiriva and for albuterol rescue inhaler

## 2015-03-23 ENCOUNTER — Telehealth: Payer: Self-pay | Admitting: Internal Medicine

## 2015-03-23 NOTE — Telephone Encounter (Signed)
Opened in error

## 2015-04-09 NOTE — Assessment & Plan Note (Signed)
We discussed persistent/recurrent positive sputums for atypical AFB/MAIC. She is not interested in treating. Symptoms are minor.

## 2015-04-09 NOTE — Assessment & Plan Note (Signed)
Minor cough more of her complaint is difficulty trying to take a deep breath which we associate with her kyphosis and loss of  thoracic volume. Plan-chest x-ray, refill Spiriva and rescue inhaler

## 2015-05-12 ENCOUNTER — Telehealth: Payer: Self-pay | Admitting: Internal Medicine

## 2015-05-12 MED ORDER — ALBUTEROL SULFATE HFA 108 (90 BASE) MCG/ACT IN AERS: 2.0000 | INHALATION_SPRAY | Freq: Four times a day (QID) | RESPIRATORY_TRACT | Status: DC | PRN

## 2015-05-12 NOTE — Telephone Encounter (Signed)
Called spoke with pt. She reports she needs proair called in for her bc it is less expensive. I have done so. Sent to [facility]. Nothing further needed

## 2015-09-21 ENCOUNTER — Ambulatory Visit: Payer: Medicare Other | Admitting: Internal Medicine

## 2015-10-10 ENCOUNTER — Telehealth: Payer: Self-pay | Admitting: Internal Medicine

## 2015-10-10 MED ORDER — AZITHROMYCIN 250 MG PO TABS: ORAL_TABLET | ORAL | 0 refills | Status: DC

## 2015-10-10 NOTE — Telephone Encounter (Signed)
Home # is not working. Called alternate # and LMTCB x1

## 2015-10-10 NOTE — Telephone Encounter (Signed)
Offer Z pak- ok to over-ride intolerance to Biaxin

## 2015-10-10 NOTE — Telephone Encounter (Signed)
Pt is aware of recs and RX for zpak sent in. Please advise on message below regarding appoitment. thanks

## 2015-10-10 NOTE — Telephone Encounter (Signed)
Called spoke with patient who reports prod cough w/ gray mucus, runny nose w/ clear mucus, some increased SOB x1 week, some tightness in chest at onset.  Denies any f/c/s, CP, hemoptysis, increased wheezing.  Did [facility] Maple HudsonYoung, you have an opening on Thursday 8.10.17.  Is this okay to use?  Pt is okay with receiving treatment over the phone if necessary.  Thank you.  [facility] [employer] Allergies  Allergen Reactions  . Avelox [Moxifloxacin Hcl In Nacl] Other (See Comments)    Flushes face  . Biaxin [Clarithromycin]     Diarrhea and bad taste in mouth  . Nsaids     Does not take due to kidney disease

## 2015-10-10 NOTE — Telephone Encounter (Signed)
If she still feels she would like appointment then ok to check with [name] about using the Thursday slot

## 2015-10-12 NOTE — Telephone Encounter (Signed)
lmtcb for pt.  

## 2015-10-12 NOTE — Telephone Encounter (Signed)
Florentina AddisonKatie can you give me a time we could offer the pt an appt slot if she wants to come in?  A few would be great.  thanks

## 2015-10-12 NOTE — Telephone Encounter (Signed)
App slot was held for today-however patient is not reachable. Triage to continue reaching out to patient. If later in the day she is reached then we can offer her another day

## 2015-10-13 NOTE — Telephone Encounter (Signed)
Please see if patient feels she needs to be seen; if so then patient can be seen Monday 10-16-15 at 10:30am slot. Thanks.

## 2015-10-13 NOTE — Telephone Encounter (Signed)
Katie please advise on another appointment date and time before we call the back today. Thanks.

## 2015-10-13 NOTE — Telephone Encounter (Signed)
Called and spoke with pt and she stated that she is feeling better and she does not need the appt on Monday.  appt has scheduled an appt in December for her. Nothing further is needed.

## 2015-10-13 NOTE — Telephone Encounter (Signed)
Triage- ok to see me as suggested by Florentina AddisonKatie, if she still needs to be seen.

## 2016-01-15 ENCOUNTER — Ambulatory Visit (INDEPENDENT_AMBULATORY_CARE_PROVIDER_SITE_OTHER)
Admission: RE | Admit: 2016-01-15 | Discharge: 2016-01-15 | Disposition: A | Discharge status: Home / Self Care | Payer: Medicare Other | Source: Ambulatory Visit | Attending: Internal Medicine | Admitting: Internal Medicine

## 2016-01-15 ENCOUNTER — Encounter: Payer: Self-pay | Admitting: Internal Medicine

## 2016-01-15 ENCOUNTER — Ambulatory Visit (INDEPENDENT_AMBULATORY_CARE_PROVIDER_SITE_OTHER): Payer: Medicare Other | Admitting: Internal Medicine

## 2016-01-15 VITALS — BP 110/66 | HR 69 | Ht <= 58 in | Wt 108.8 lb

## 2016-01-15 DIAGNOSIS — J4489 Other specified chronic obstructive pulmonary disease: Secondary | ICD-10-CM

## 2016-01-15 DIAGNOSIS — Z23 Encounter for immunization: Secondary | ICD-10-CM | POA: Diagnosis not present

## 2016-01-15 DIAGNOSIS — J449 Chronic obstructive pulmonary disease, unspecified: Secondary | ICD-10-CM

## 2016-01-15 DIAGNOSIS — J310 Chronic rhinitis: Secondary | ICD-10-CM | POA: Diagnosis not present

## 2016-01-15 DIAGNOSIS — A310 Pulmonary mycobacterial infection: Secondary | ICD-10-CM | POA: Diagnosis not present

## 2016-01-15 MED ORDER — BENZONATATE 200 MG PO CAPS: 200.0000 mg | ORAL_CAPSULE | Freq: Three times a day (TID) | ORAL | 1 refills | Status: DC | PRN

## 2016-01-15 MED ORDER — UMECLIDINIUM BROMIDE 62.5 MCG/INH IN AEPB: 1.0000 | INHALATION_SPRAY | Freq: Every day | RESPIRATORY_TRACT | 0 refills | Status: DC

## 2016-01-15 MED ORDER — AZELASTINE-FLUTICASONE 137-50 MCG/ACT NA SUSP: 2.0000 | Freq: Every day | NASAL | 0 refills | Status: DC

## 2016-01-15 NOTE — Patient Instructions (Signed)
Order- CXR   Dx COPD mixed type, Hx MAIC  Sample Incruse inhaler      Inhale 1 puff, once daily, instead of Spiriva, for trial   See if it helps your cough better  Sample Dymista nasal spray    1-2 puffs each nostril once daily at bedtime. See if regular use helps the runny nose.  Flu vax- senior  Please call as needed

## 2016-01-15 NOTE — Progress Notes (Signed)
82 year old female former smoker, retired Charity fundraiserN, followed for COPD, bronchiectasis/+  MAIC+, ? Nodule, thyroid nodule, CAD. We started triple therapy with clarithromycin 250 mg x2, rifampin 300 mg x2 and Myambutol 400 mg x3, each taken 3 days per week beginning 08/08/2011/ end 03/26/12. [facility]  11/21/14-82 year old female former smoker, retired Charity fundraiserN, followed for COPD, bronchiectasis/+  MAIC+, ? Nodule,  Complicated by thyroid nodule, CAD, CKD. We started triple therapy with clarithromycin 250 mg x2, rifampin 300 mg x2 and Myambutol 400 mg x3, each taken 3 days per week beginning 08/08/2011/ End 03/26/12 FOLLOWS FOR: Pt states she Anoro did not help- pt no longer taking. Pt states her breathing has slightly worsened since last OV. Pt c/o increase in SOB, non prod cough, chest congestion x 2 weeks. Pt denies f/c/s, CP/tightness.  Persistent scant yellow sputum with no blood, fever or sore throat. Increased cough in the past month. She doesn't remember if Z-Pak in July helped her. Wants to wait on flu shot to later. CXR 09/02/14 :No acute abnormality. Emphysema. Aortic atherosclerosis. Electronically Signed  By: Francene BoyersJames Maxwell M.D.  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 Nodule,  Complicated by thyroid nodule, CAD, CKD. We started triple therapy with clarithromycin 250 mg x2, rifampin 300 mg x2 and Myambutol 400 mg x3, each taken 3 days per week beginning 08/08/2011/ End 03/26/12 [facility] FOLLOWS FOR:  Pt notes some increased SOB; "less space to breathe". Pt notes that she shakes when she attempts deep breaths. Pain noted shooting up left side of head.  She coughed more when she had a cold, now resolving. Scant sputum. 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  Nodule Complicated by thyroid nodule, CAD, CKD FOLLOWS FOR: Pt states she has cough-worse in the morning, drippy nose Morning cough when she first gets up and starts moving. Little sputum. No fever or sweat. Complains of postnasal drainage. Ipratropium nasal spray helps some but using just 1 puff once daily. CXR 03/22/2015- Stable nodules mid and lower zones. No acute cardiopulmonary abnormality.   ROS-See HPI Constitutional:   No-   weight loss, night sweats, fevers, chills, fatigue, lassitude. HEENT:   No-  headaches, difficulty swallowing, tooth/dental problems, sore throat,       No-  sneezing, itching, ear ache, nasal congestion, +post nasal drip,  CV:  No-   chest pain, orthopnea, PND, swelling in lower extremities, anasarca, dizziness, palpitations Resp: +  shortness of breath with exertion, Not or at rest.           + cough,  + non-productive cough,  No- coughing up of blood.             in color of mucus.  No- wheezing.   Skin:   No- rash or lesions. GI:  No-heartburn, indigestion, no-abdominal pain, nausea, vomiting, GU: . MS:  No-   joint pain or swelling.   Neuro-     nothing unusual Psych:  No- change in mood or affect. No depression or anxiety.  No memory loss.  OBJ General- Alert, Oriented, Affect-appropriate, Distress- none acute; petite elderly woman. She appears well. Skin- rash-none,excoriation- none.  Lymphadenopathy- none Head- atraumatic            Eyes- Gross vision intact, PERRLA, conjunctivae clear secretions  Ears- +hard of hearing            Nose- Clear, no-Septal dev, mucus, polyps, erosion, perforation             Throat- Mallampati II , mucosa clear , drainage- none  tonsils- atrophic Neck- flexible , trachea midline, no stridor , thyroid+ nodule R, carotid no bruit Chest - symmetrical excursion , unlabored           Heart/CV- RRR , no murmur , no gallop  , no rub, nl s1 s2                           - JVD- none , edema- none, stasis changes-  none, varices- none           Lung- clear, unlabored, cough-none , dullness-none, rub- none           Chest wall-+ kyphosis Abd-  Br/ Gen/ Rectal- Not done, not indicated Extrem- cyanosis- none, clubbing, none, atrophy- none, strength- nl Neuro- grossly intact to observation

## 2016-01-16 NOTE — Assessment & Plan Note (Signed)
She has not been using her nasal spray enough to be sure it isn't effective. Plan-try Dymista for comparison

## 2016-01-16 NOTE — Assessment & Plan Note (Signed)
Not clinically worse by her description. Plan-update chest x-ray. Sample Incruse inhaler to see if LAMA can help reduce cough and mucus.

## 2016-01-16 NOTE — Assessment & Plan Note (Signed)
We are watching conservatively now after unsuccessful treatment course in 2013

## 2016-01-19 ENCOUNTER — Telehealth: Payer: Self-pay | Admitting: Internal Medicine

## 2016-01-19 NOTE — Telephone Encounter (Signed)
Notes Recorded by Waymon Budgelinton D Young, MD on 01/15/2016 at 4:07 PM EST CXR - stable with no acive lung process seen. Heart is at upper limit of normal size without congestive heart failure. ------------------------------ Spoke with pt. She is aware of results. Nothing further was needed.

## 2016-01-26 ENCOUNTER — Telehealth: Payer: Self-pay | Admitting: Internal Medicine

## 2016-01-26 MED ORDER — UMECLIDINIUM BROMIDE 62.5 MCG/INH IN AEPB: 1.0000 | INHALATION_SPRAY | Freq: Every day | RESPIRATORY_TRACT | 11 refills | Status: DC

## 2016-01-26 NOTE — Telephone Encounter (Signed)
Notes Recorded by Waymon Budgelinton D Young, MD on 01/15/2016 at 4:07 PM EST CXR - stable with no acive lung process seen. Heart is at upper limit of normal size without congestive heart failure. ------- Spoke with pt, aware of results.    Pt also requesting rx for incruse be sent to pharmacy.  This has been sent.  Nothing further needed.

## 2016-03-01 ENCOUNTER — Ambulatory Visit: Payer: Medicare Other | Admitting: Internal Medicine

## 2016-05-03 ENCOUNTER — Encounter: Payer: Self-pay | Admitting: Adult Health

## 2016-05-03 ENCOUNTER — Ambulatory Visit (INDEPENDENT_AMBULATORY_CARE_PROVIDER_SITE_OTHER)
Admission: RE | Admit: 2016-05-03 | Discharge: 2016-05-03 | Disposition: A | Discharge status: Home / Self Care | Payer: Medicare Other | Source: Ambulatory Visit | Attending: Adult Health | Admitting: Adult Health

## 2016-05-03 ENCOUNTER — Ambulatory Visit (INDEPENDENT_AMBULATORY_CARE_PROVIDER_SITE_OTHER): Payer: Medicare Other | Admitting: Adult Health

## 2016-05-03 ENCOUNTER — Telehealth: Payer: Self-pay | Admitting: Internal Medicine

## 2016-05-03 VITALS — BP 122/64 | HR 66 | Ht 60.0 in | Wt 107.6 lb

## 2016-05-03 DIAGNOSIS — J449 Chronic obstructive pulmonary disease, unspecified: Secondary | ICD-10-CM | POA: Diagnosis not present

## 2016-05-03 DIAGNOSIS — J4489 Other specified chronic obstructive pulmonary disease: Secondary | ICD-10-CM

## 2016-05-03 DIAGNOSIS — J189 Pneumonia, unspecified organism: Secondary | ICD-10-CM

## 2016-05-03 NOTE — Telephone Encounter (Signed)
Pt states that she was recently diagnosed with PNA and she was given an antibiotic and Prednisone by a PA at a walk-in clinic near her home  - does not remember the medications given or doses. Clinic is not in [facility]. No cxr was done at visit. Pt states that she feels "okay" but is still fatigued and coughing. Pt is requesting an OV with cxr to follow up on the PNA.   Okay to schedule with TP per Florentina AddisonKatie. Appt okayed per Jessical for 3pm.  Pt scheduled with Tammy Parrett. NP at 3pm this afternoon. Pt advised to arrive between 230/245 for cxr to be done prior to appt. This order has been placed as STAT. Nothing further needed.  [facility] Lorain ChildesFYI

## 2016-05-03 NOTE — Patient Instructions (Signed)
Continue on INCRUSE  Mucinex Twice daily  .As needed  Cough/congestion  Follow up Dr. Maple HudsonYoung  In 3-4 months and As needed

## 2016-05-03 NOTE — Progress Notes (Signed)
@Patient  ID: [name], female    DOB: May 15, 1928, 83 y.o.   MRN: [id number]  Chief Complaint  Patient presents with  . 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   05/03/2016 Acute OV : COPD  Patient presents for an acute office visit. Complains of increased cough and congestion 1 month ago , seen at urgent care . Tx w/ Doxycycline and prednisone taper.  Dx w/ PNA , no cxr was done. She is starting to feel better. CXR today shows some interstitial prominence.  No fever. Appetite is ok. No n/v.d.  She remains on  Incruse. .   Allergies  Allergen Reactions  . Avelox [Moxifloxacin Hcl In Nacl] Other (See Comments)    Flushes face  . Biaxin [Clarithromycin]     Diarrhea and bad taste in mouth  . Nsaids     Does not take due to kidney disease    Immunization History  Administered Date(s) Administered  . Influenza Split 12/17/2010, 01/09/2012  . Influenza, High Dose Seasonal PF 01/15/2016  . Influenza,inj,Quad PF,36+ Mos 11/16/2012, 12/28/2013, 02/02/2015  . Pneumococcal Conjugate-13 03/31/2014  . Pneumococcal Polysaccharide-23 12/17/2010    Past Medical History:  Diagnosis Date  . Abnormal heart rhythm    pt not sure what kind.  . Anemia   . Arthritis   . COPD (chronic obstructive pulmonary disease) (HCC)   . Dry skin   . GERD (gastroesophageal reflux disease)   . Hypertension   . Hypothyroidism   . Irritable bowel syndrome   . Kidney disease   . Pneumonia   . Shortness of breath   . Weight loss    10-12 lbs in 2 months (03/2011)    Tobacco History: History  Smoking Status  . Former Smoker  . Packs/day: 2.00  . Years: 26.00  . Types: Cigarettes  . Quit date: 03/04/1982  Smokeless Tobacco  . Never Used   Counseling given: Not Answered   Outpatient Encounter Prescriptions as of 05/03/2016  Medication Sig  .  acetaminophen (TYLENOL) 500 MG tablet Take 1,000 mg by mouth every 8 (eight) hours as needed.   Marland Kitchen albuterol (PROAIR HFA) 108 (90 Base) MCG/ACT inhaler Inhale 2 puffs into the lungs every 6 (six) hours as needed for wheezing or shortness of breath.  Marland Kitchen amLODipine (NORVASC) 5 MG tablet Take 1 tablet by mouth daily.  Marland Kitchen aspirin EC 81 MG tablet Take 1 tablet by mouth daily.  . Azelastine-Fluticasone (DYMISTA) 137-50 MCG/ACT SUSP Place 2 sprays into both nostrils at bedtime.  . benzonatate (TESSALON) 200 MG capsule Take 1 capsule (200 mg total) by mouth 3 (three) times daily as needed for cough.  . calcium carbonate (OS-CAL) 600 MG TABS Take 600 mg by mouth daily.  . DULoxetine (CYMBALTA) 60 MG capsule Take 60 mg by mouth daily.   . famotidine (PEPCID) 40 MG tablet Take 1 tablet by mouth daily.  Marland Kitchen ipratropium (ATROVENT) 0.06 % nasal spray Place 2 sprays into the nose 4 (four) times daily. If needed (Patient taking differently: Place 2 sprays into the nose daily as needed. If needed)  . levothyroxine (SYNTHROID, LEVOTHROID) 50 MCG tablet Take 50 mcg by mouth daily before breakfast.  . metoprolol (TOPROL-XL) 50 MG 24 hr tablet Take 50 mg by mouth 2 (two) times daily.   . Multiple Vitamins-Minerals (ICAPS AREDS 2)  CAPS Take 1 capsule by mouth 2 (two) times daily.  Marland Kitchen. tiotropium (SPIRIVA HANDIHALER) 18 MCG inhalation capsule Place 1 capsule (18 mcg total) into inhaler and inhale daily.  Marland Kitchen. umeclidinium bromide (INCRUSE ELLIPTA) 62.5 MCG/INH AEPB Inhale 1 puff into the lungs daily.  . [DISCONTINUED] Multiple Vitamin (MULTIVITAMIN) tablet Take 1 tablet by mouth daily.     No facility-administered encounter medications on file as of 05/03/2016.      Review of Systems  Constitutional:   No  weight loss, night sweats,  Fevers, chills, fatigue, or  lassitude.  HEENT:   No headaches,  Difficulty swallowing,  Tooth/dental problems, or  Sore throat,                No sneezing, itching, ear ache,  +nasal  congestion, post nasal drip,   CV:  No chest pain,  Orthopnea, PND, swelling in lower extremities, anasarca, dizziness, palpitations, syncope.   GI  No heartburn, indigestion, abdominal pain, nausea, vomiting, diarrhea, change in bowel habits, loss of appetite, bloody stools.   Resp:  .  No chest wall deformity  Skin: no rash or lesions.  GU: no dysuria, change in color of urine, no urgency or frequency.  No flank pain, no hematuria   MS:  No joint pain or swelling.  No decreased range of motion.  No back pain.    Physical Exam  BP 122/64 (BP Location: Left Arm, Cuff Size: Normal)   Pulse 66   Ht 5' (1.524 m)   Wt 107 lb 9.6 oz (48.8 kg)   SpO2 97%   BMI 21.01 kg/m   GEN: A/Ox3; pleasant , NAD, thin , frail    HEENT:  [place]/AT,  EACs-clear, TMs-wnl, NOSE-clear, THROAT-clear, no lesions, no postnasal drip or exudate noted.   NECK:  Supple w/ fair ROM; no JVD; normal carotid impulses w/o bruits; no thyromegaly or nodules palpated; no lymphadenopathy.    RESP  Clear  P & A; w/o, wheezes/ rales/ or rhonchi. no accessory muscle use, no dullness to percussion  CARD:  RRR, no m/r/g, no peripheral edema, pulses intact, no cyanosis or clubbing.  GI:   Soft & nt; nml bowel sounds; no organomegaly or masses detected.   Musco: Warm bil, no deformities or joint swelling noted.   Neuro: alert, no focal deficits noted.    Skin: Warm, no lesions or rashes    Lab Results:   BNP No results found for: BNP  ProBNP No results found for: PROBNP  Imaging: Dg Chest 2 View  Result Date: 05/03/2016 CLINICAL DATA:  Cough.  Shortness of breath. EXAM: CHEST  2 VIEW COMPARISON:  01/15/2016. FINDINGS: Mediastinum and hilar structures normal. Cardiomegaly with mild bilateral interstitial prominence. Mild CHF and/or pneumonitis cannot be excluded. No prominent pleural effusion. No pneumothorax . IMPRESSION: Cardiomegaly with mild bilateral interstitial prominence. Mild CHF and/or pneumonitis  cannot be excluded. Electronically Signed   By: Maisie Fushomas  Register   On: 05/03/2016 15:06     Assessment & Plan:   COPD (chronic obstructive pulmonary disease) with chronic bronchitis Recent flare +/- PNA clinically improved with abx.  cXR shows interstitail prominence ? Chronic changes. No sign of acute pna or edema.  If cough persists , can check sputum cx and AFB   Plan  Patient Instructions  Continue on INCRUSE  Mucinex Twice daily  .As needed  Cough/congestion  Follow up Dr. Maple HudsonYoung  In 3-4 months and As needed         [facility] Denson Niccoli,  NP 05/03/2016

## 2016-05-03 NOTE — Assessment & Plan Note (Addendum)
Recent flare +/- PNA clinically improved with abx.  cXR shows interstitail prominence ? Chronic changes. No sign of acute pna or edema.  If cough persists , can check sputum cx and AFB   Plan  Patient Instructions  Continue on INCRUSE  Mucinex Twice daily  .As needed  Cough/congestion  Follow up Dr. Maple HudsonYoung  In 3-4 months and As needed

## 2016-06-09 ENCOUNTER — Emergency Department (HOSPITAL_BASED_OUTPATIENT_CLINIC_OR_DEPARTMENT_OTHER): Payer: Medicare Other

## 2016-06-09 ENCOUNTER — Encounter (HOSPITAL_BASED_OUTPATIENT_CLINIC_OR_DEPARTMENT_OTHER): Payer: Self-pay | Admitting: Radiology

## 2016-06-09 ENCOUNTER — Emergency Department (HOSPITAL_BASED_OUTPATIENT_CLINIC_OR_DEPARTMENT_OTHER)
Admission: EM | Admit: 2016-06-09 | Discharge: 2016-06-09 | Disposition: A | Discharge status: Home / Self Care | Payer: Medicare Other | Attending: Emergency Medicine | Admitting: Emergency Medicine

## 2016-06-09 DIAGNOSIS — Z87891 Personal history of nicotine dependence: Secondary | ICD-10-CM | POA: Insufficient documentation

## 2016-06-09 DIAGNOSIS — J441 Chronic obstructive pulmonary disease with (acute) exacerbation: Secondary | ICD-10-CM | POA: Diagnosis not present

## 2016-06-09 DIAGNOSIS — E039 Hypothyroidism, unspecified: Secondary | ICD-10-CM | POA: Diagnosis not present

## 2016-06-09 DIAGNOSIS — R0602 Shortness of breath: Secondary | ICD-10-CM | POA: Diagnosis present

## 2016-06-09 DIAGNOSIS — R202 Paresthesia of skin: Secondary | ICD-10-CM | POA: Diagnosis not present

## 2016-06-09 DIAGNOSIS — I10 Essential (primary) hypertension: Secondary | ICD-10-CM | POA: Insufficient documentation

## 2016-06-09 DIAGNOSIS — Z7982 Long term (current) use of aspirin: Secondary | ICD-10-CM | POA: Insufficient documentation

## 2016-06-09 LAB — BASIC METABOLIC PANEL
ANION GAP: 8 (ref 5–15)
BUN: 27 mg/dL — ABNORMAL HIGH (ref 6–20)
CALCIUM: 9.8 mg/dL (ref 8.9–10.3)
CO2: 28 mmol/L (ref 22–32)
Chloride: 106 mmol/L (ref 101–111)
Creatinine, Ser: 1.87 mg/dL — ABNORMAL HIGH (ref 0.44–1.00)
GFR calc non Af Amer: 23 mL/min — ABNORMAL LOW (ref >60)
GFR, EST AFRICAN AMERICAN: 27 mL/min — AB (ref >60)
Glucose, Bld: 103 mg/dL — ABNORMAL HIGH (ref 65–99)
Potassium: 4.5 mmol/L (ref 3.5–5.1)
SODIUM: 142 mmol/L (ref 135–145)

## 2016-06-09 LAB — CBC WITH DIFFERENTIAL/PLATELET
BASOS ABS: 0.1 10*3/uL (ref 0.0–0.1)
BASOS PCT: 1 %
EOS ABS: 0.4 10*3/uL (ref 0.0–0.7)
Eosinophils Relative: 5 %
HEMATOCRIT: 33.3 % — AB (ref 36.0–46.0)
Hemoglobin: 10.8 g/dL — ABNORMAL LOW (ref 12.0–15.0)
Lymphocytes Relative: 30 %
Lymphs Abs: 2.3 10*3/uL (ref 0.7–4.0)
MCH: 28.1 pg (ref 26.0–34.0)
MCHC: 32.4 g/dL (ref 30.0–36.0)
MCV: 86.7 fL (ref 78.0–100.0)
MONOS PCT: 9 %
Monocytes Absolute: 0.7 10*3/uL (ref 0.1–1.0)
NEUTROS ABS: 4.3 10*3/uL (ref 1.7–7.7)
Neutrophils Relative %: 55 %
PLATELETS: 243 10*3/uL (ref 150–400)
RBC: 3.84 MIL/uL — AB (ref 3.87–5.11)
RDW: 15.2 % (ref 11.5–15.5)
WBC: 7.7 10*3/uL (ref 4.0–10.5)

## 2016-06-09 MED ORDER — ALBUTEROL SULFATE (2.5 MG/3ML) 0.083% IN NEBU
5.0000 mg | INHALATION_SOLUTION | Freq: Once | RESPIRATORY_TRACT | Status: AC
  Administered 2016-06-09: 5 mg via RESPIRATORY_TRACT
  Filled 2016-06-09: qty 6

## 2016-06-09 MED ORDER — PREDNISONE 10 MG PO TABS
60.0000 mg | ORAL_TABLET | Freq: Once | ORAL | Status: AC
  Administered 2016-06-09: 21:00:00 60 mg via ORAL
  Filled 2016-06-09: qty 1

## 2016-06-09 MED ORDER — PREDNISONE 20 MG PO TABS: ORAL_TABLET | ORAL | 0 refills | Status: DC

## 2016-06-09 NOTE — ED Triage Notes (Signed)
Pt reports sob for years (hx of COPD) and states this last flare-up started about a month ago. Denies fever, chest pain. Pt able to speak in complete sentences.

## 2016-06-09 NOTE — ED Notes (Signed)
Patient returned from XR. RT at bedside for neb treatment.

## 2016-06-09 NOTE — ED Notes (Signed)
Patient requested to be "stuck" for blood in lieu of an IV placed.

## 2016-06-09 NOTE — ED Notes (Signed)
Patient transported to X-ray 

## 2016-06-09 NOTE — ED Provider Notes (Signed)
[facility] Provider Note   CSN: [id number] Arrival date & time: 06/09/16  4098  By signing my name below, I, Bing Neighbors., attest that this documentation has been prepared under the direction and in the presence of No att. providers found. Electronically signed: Bing Neighbors., ED Scribe. 06/09/16. 11:45 PM.   History   Chief Complaint Chief Complaint  Patient presents with  . Shortness of Breath    HPI [name] is a 83 y.o. female with hx of COPD, macular degeneration, hip replacement who presents to the Emergency Department complaining of chronic SOB with worsening over the last 2 months. Pt states that she has also had some numbness to both of her legs over the last several days.  This is not worse with ambulation.  No weakness to the legs.  No arm involvement.  She denies any modifying factors. Pt denies fever, chest pain. Of note, pt denies oxygen use at home. She states that she uses an albuterol inhaler twice daily as needed for SOB. She also had visual floaters and headache last night, but no vision changes today.  No current headache.  Has hx of macular degeneration.  Pt states that she has had difficulty walking in water aerobics for the past x1 month due to SOB. Pt states that she was seen at her PCP x2 months ago for SOB and given doxycycline 100 mg twice daily with relief.    The history is provided by the patient and a relative. No language interpreter was used.    Past Medical History:  Diagnosis Date  . Abnormal heart rhythm    pt not sure what kind.  . Anemia   . Arthritis   . COPD (chronic obstructive pulmonary disease) (HCC)   . Dry skin   . GERD (gastroesophageal reflux disease)   . Hypertension   . Hypothyroidism   . Irritable bowel syndrome   . Kidney disease   . Pneumonia   . Shortness of breath   . Weight loss    10-12 lbs in 2 months (03/2011)    Patient Active Problem List   Diagnosis Date Noted  .  Arthritis, hip 04/20/2014  . Can't get food down 02/01/2014  . H/O cataract extraction 09/02/2013  . Age-related macular degeneration, dry 08/03/2013  . Glaucoma suspect 08/03/2013  . DDD (degenerative disc disease), lumbosacral 09/25/2012  . External hemorrhoid 09/25/2012  . Pain in joint, ankle and foot 08/10/2012  . Contracture of toe of left foot 08/10/2012  . Atherosclerosis of renal artery (HCC) 08/08/2012  . OP (osteoporosis) 08/08/2012  . Rhinitis, nonallergic, chronic 01/19/2012  . GERD (gastroesophageal reflux disease) 01/19/2012  . Adult hypothyroidism 11/14/2011  . Atypical mycobacterial infection of lung (HCC) 07/03/2011  . Lung nodule 07/03/2011  . Osteoarthritis of right hip 04/26/2011  . Aseptic necrosis (HCC) 01/27/2011  . COPD (chronic obstructive pulmonary disease) with chronic bronchitis (HCC) 12/17/2010    Past Surgical History:  Procedure Laterality Date  . ANTERIOR AND POSTERIOR VAGINAL REPAIR    . CARDIAC CATHETERIZATION     6 years ago  . COLONOSCOPY    . ESOPHAGOGASTRODUODENOSCOPY    . EYE SURGERY Bilateral 09/2013   cataract surgery with lens implant  . THYROIDECTOMY     Left  . TOE AMPUTATION Left    2nd toe  . TONSILLECTOMY    . TOTAL HIP ARTHROPLASTY  04/24/2011   Procedure: TOTAL HIP ARTHROPLASTY;  Surgeon: Nestor Lewandowsky, MD;  Location:  [facility] OR;  Service: Orthopedics;  Laterality: Right;  DEPUY/PINNACLE CUP, SUMMIT BASIC STEM  . TOTAL HIP ARTHROPLASTY Left 04/20/2014   Procedure: TOTAL HIP ARTHROPLASTY;  Surgeon: Nestor Lewandowsky, MD;  Location: [facility] OR;  Service: Orthopedics;  Laterality: Left;    OB History    No data available       Home Medications    Prior to Admission medications   Medication Sig Start Date End Date Taking? Authorizing Provider  acetaminophen (TYLENOL) 500 MG tablet Take 1,000 mg by mouth every 8 (eight) hours as needed.    Yes Historical Provider, MD  albuterol (PROAIR HFA) 108 (90 Base) MCG/ACT inhaler Inhale 2 puffs  into the lungs every 6 (six) hours as needed for wheezing or shortness of breath. 05/12/15  Yes Waymon Budge, MD  amLODipine (NORVASC) 5 MG tablet Take 1 tablet by mouth daily. 10/14/10  Yes Historical Provider, MD  aspirin EC 81 MG tablet Take 1 tablet by mouth daily.   Yes Historical Provider, MD  Azelastine-Fluticasone (DYMISTA) 137-50 MCG/ACT SUSP Place 2 sprays into both nostrils at bedtime. 01/15/16  Yes Waymon Budge, MD  benzonatate (TESSALON) 200 MG capsule Take 1 capsule (200 mg total) by mouth 3 (three) times daily as needed for cough. 01/15/16  Yes Waymon Budge, MD  calcium carbonate (OS-CAL) 600 MG TABS Take 600 mg by mouth daily.   Yes Historical Provider, MD  DULoxetine (CYMBALTA) 60 MG capsule Take 60 mg by mouth daily.    Yes Historical Provider, MD  famotidine (PEPCID) 40 MG tablet Take 1 tablet by mouth daily. 10/10/10  Yes Historical Provider, MD  ipratropium (ATROVENT) 0.06 % nasal spray Place 2 sprays into the nose 4 (four) times daily. If needed Patient taking differently: Place 2 sprays into the nose daily as needed. If needed 10/21/14 05/03/17 Yes Waymon Budge, MD  levothyroxine (SYNTHROID, LEVOTHROID) 50 MCG tablet Take 50 mcg by mouth daily before breakfast.   Yes Historical Provider, MD  metoprolol (TOPROL-XL) 50 MG 24 hr tablet Take 50 mg by mouth 2 (two) times daily.    Yes Historical Provider, MD  Multiple Vitamins-Minerals (ICAPS AREDS 2) CAPS Take 1 capsule by mouth 2 (two) times daily.   Yes Historical Provider, MD  tiotropium (SPIRIVA HANDIHALER) 18 MCG inhalation capsule Place 1 capsule (18 mcg total) into inhaler and inhale daily. 03/22/15  Yes Waymon Budge, MD  umeclidinium bromide (INCRUSE ELLIPTA) 62.5 MCG/INH AEPB Inhale 1 puff into the lungs daily. 01/26/16  Yes Waymon Budge, MD  predniSONE (DELTASONE) 20 MG tablet 2 tabs po daily x 4 days 06/09/16   Rolan Bucco, MD    Family History Family History  Problem Relation Age of Onset  . Emphysema  Sister     smoker  . Allergies Brother   . Heart disease Brother   . Rheum arthritis Daughter   . Cancer Mother     GI  . Cancer Brother     throat  . Cancer Sister     unsure    Social History Social History  Substance Use Topics  . Smoking status: Former Smoker    Packs/day: 2.00    Years: 26.00    Types: Cigarettes    Quit date: 03/04/1982  . Smokeless tobacco: Never Used  . Alcohol use 0.6 oz/week    1 Shots of liquor per week     Allergies   Avelox [moxifloxacin hcl in nacl]; Biaxin [clarithromycin]; and Nsaids   Review  of Systems Review of Systems  Constitutional: Negative for chills, diaphoresis, fatigue and fever.  HENT: Negative for congestion, rhinorrhea and sneezing.   Eyes: Positive for visual disturbance.  Respiratory: Positive for cough and shortness of breath. Negative for chest tightness.   Cardiovascular: Negative for chest pain and leg swelling.  Gastrointestinal: Negative for abdominal pain, blood in stool, diarrhea, nausea and vomiting.  Genitourinary: Negative for difficulty urinating, flank pain, frequency and hematuria.  Musculoskeletal: Negative for arthralgias and back pain.  Skin: Negative for rash.  Neurological: Positive for numbness. Negative for dizziness, speech difficulty, weakness and headaches. Seizures: bilateral legs.     Physical Exam Updated Vital Signs BP 139/83 (BP Location: Right Arm)   Pulse 72   Temp 98.9 F (37.2 C) (Oral)   Resp 20   Ht 5' (1.524 m)   Wt 107 lb (48.5 kg)   SpO2 96%   BMI 20.90 kg/m   Physical Exam  Constitutional: She is oriented to person, place, and time. She appears well-developed and well-nourished.  HENT:  Head: Normocephalic and atraumatic.  Eyes: Pupils are equal, round, and reactive to light.  Neck: Normal range of motion. Neck supple.  Cardiovascular: Normal rate, regular rhythm and normal heart sounds.   Pulmonary/Chest: Effort normal. No respiratory distress. She has wheezes. She  has no rales. She exhibits no tenderness.  Mild diminished breath sounds and expiratory wheezes bilaterally  Abdominal: Soft. Bowel sounds are normal. There is no tenderness. There is no rebound and no guarding.  Musculoskeletal: Normal range of motion. She exhibits no edema.  Patient's lower extremities are normal warmth and color. There is no swelling. No pain on palpation. No signs of cellulitis. Pedal pulses are present by Doppler  Lymphadenopathy:    She has no cervical adenopathy.  Neurological: She is alert and oriented to person, place, and time.  Skin: Skin is warm and dry. No rash noted.  Psychiatric: She has a normal mood and affect.     ED Treatments / Results   DIAGNOSTIC STUDIES: Oxygen Saturation is 96% on RA, adequate by my interpretation.   COORDINATION OF CARE: 11:45 PM-Discussed next steps with pt. Pt verbalized understanding and is agreeable with the plan.    Labs (all labs ordered are listed, but only abnormal results are displayed) Labs Reviewed  BASIC METABOLIC PANEL - Abnormal; Notable for the following:       Result Value   Glucose, Bld 103 (*)    BUN 27 (*)    Creatinine, Ser 1.87 (*)    GFR calc non Af Amer 23 (*)    GFR calc Af Amer 27 (*)    All other components within normal limits  CBC WITH DIFFERENTIAL/PLATELET - Abnormal; Notable for the following:    RBC 3.84 (*)    Hemoglobin 10.8 (*)    HCT 33.3 (*)    All other components within normal limits    EKG  EKG Interpretation  Date/Time:  Sunday June 09 2016 20:31:44 EDT Ventricular Rate:  74 PR Interval:    QRS Duration: 90 QT Interval:  401 QTC Calculation: 445 R Axis:   -12 Text Interpretation:  Sinus rhythm Low voltage, extremity leads Baseline wander in lead(s) V1 V2 V4 V6 since last tracing no significant change Confirmed by Laterrian Hevener  MD, Rithika Seel ([id number]) on 06/09/2016 9:06:06 PM       Radiology Dg Chest 2 View  Result Date: 06/09/2016 CLINICAL DATA:  Shortness of breath and  wheezing. Chronic bronchitis . EXAM:  CHEST  2 VIEW COMPARISON:  05/29/2013 FINDINGS: The heart size is within normal limits. Tortuosity and atherosclerotic calcification of thoracic aorta is again noted. Small hiatal hernia again noted. Stable pulmonary hyperinflation. Both lungs are clear. Lower thoracic spine degenerative changes again noted. IMPRESSION: COPD.  No active disease. Stable small hiatal hernia.  Aortic atherosclerosis. Electronically Signed   By: Myles Rosenthal M.D.   On: 06/09/2016 21:32    Procedures Procedures (including critical care time)  Medications Ordered in ED Medications  predniSONE (DELTASONE) tablet 60 mg (60 mg Oral Given 06/09/16 2033)  albuterol (PROVENTIL) (2.5 MG/3ML) 0.083% nebulizer solution 5 mg (5 mg Nebulization Given 06/09/16 2052)     Initial Impression / Assessment and Plan / ED Course  I have reviewed the triage vital signs and the nursing notes.  Pertinent labs & imaging results that were available during my care of the patient were reviewed by me and considered in my medical decision making (see chart for details).     Patient presents with multiple vague complaints. On talking with her son, it sounds like the main reason she came in as she's had worsening shortness breath over last couple months. She is in no distress currently. She had mild wheezing and was given albuterol treatment. I will start her on a prednisone burst. There is no evidence of pneumonia. No change in her sputum production. I encouraged her to follow-up with her pulmonologist.  She also has some numbness in both of her legs that comes and goes. It doesn't seem to be caught location although she does have some diminished pulses in her feet. There is no evidence of acute ischemia. I encouraged her to follow-up with her primary care provider as she may need vascular studies. She doesn't have any other symptoms that would be more consistent with a stroke. No back pain or radicular symptoms.  She has mild elevation in her creatinine above her baseline which per record review is around 1.2. I also advised her to discuss this with her PCP.  She has a history of macular degeneration and had some vision changes last night but her vision is back to normal currently. I advised her to contact her ophthalmologist tomorrow.  She was discharged home in good condition. Return precautions were given.  Final Clinical Impressions(s) / ED Diagnoses   Final diagnoses:  COPD exacerbation (HCC)  Paresthesias    New Prescriptions Discharge Medication List as of 06/09/2016 10:14 PM    START taking these medications   Details  predniSONE (DELTASONE) 20 MG tablet 2 tabs po daily x 4 days, Print       I personally performed the services described in this documentation, which was scribed in my presence.  The recorded information has been reviewed and considered.     Rolan Bucco, MD 06/09/16 [phone]

## 2016-06-09 NOTE — ED Notes (Signed)
ED Provider at bedside. 

## 2016-06-09 NOTE — Discharge Instructions (Signed)
You need to call tomorrow and make an appointment to follow-up with your primary care provider. You also need to call tomorrow to be seen by your ophthalmologist. Return to the emergency department if you have any worsening symptoms.

## 2016-06-09 NOTE — ED Notes (Signed)
Patient c/o ongoing SOB (hx of COPD), reports she gets winded if she has to walk too far. Patient also c/o legs and arms tingling and feeling cold, reports decreased sensation when touch is applied. Patient is A&Ox4, NAD noted. Patient speaking in complete sentences.

## 2016-06-09 NOTE — ED Notes (Signed)
Pt given d/c instructions as per chart. Rx x 1. Verbalizes understanding. No questions. 

## 2016-06-25 ENCOUNTER — Telehealth: Payer: Self-pay | Admitting: Internal Medicine

## 2016-06-25 NOTE — Telephone Encounter (Signed)
Per CY verbally-okay to refill. lmtcb x1 for pt.

## 2016-06-25 NOTE — Telephone Encounter (Signed)
Pt is requesting a refill of the tessalon.   Refill last sent in 01/15/16--#30 with 1 refill. Seen by TP 05/03/16.  CY please advise. Thanks  Allergies  Allergen Reactions  . Avelox [Moxifloxacin Hcl In Nacl] Other (See Comments)    Flushes face  . Biaxin [Clarithromycin]     Diarrhea and bad taste in mouth  . Nsaids     Does not take due to kidney disease    Current Outpatient Prescriptions on File Prior to Visit  Medication Sig Dispense Refill  . acetaminophen (TYLENOL) 500 MG tablet Take 1,000 mg by mouth every 8 (eight) hours as needed.     Marland Kitchen albuterol (PROAIR HFA) 108 (90 Base) MCG/ACT inhaler Inhale 2 puffs into the lungs every 6 (six) hours as needed for wheezing or shortness of breath. 3 Inhaler 3  . amLODipine (NORVASC) 5 MG tablet Take 1 tablet by mouth daily.    Marland Kitchen aspirin EC 81 MG tablet Take 1 tablet by mouth daily.    . Azelastine-Fluticasone (DYMISTA) 137-50 MCG/ACT SUSP Place 2 sprays into both nostrils at bedtime. 1 Bottle 0  . benzonatate (TESSALON) 200 MG capsule Take 1 capsule (200 mg total) by mouth 3 (three) times daily as needed for cough. 30 capsule 1  . calcium carbonate (OS-CAL) 600 MG TABS Take 600 mg by mouth daily.    . DULoxetine (CYMBALTA) 60 MG capsule Take 60 mg by mouth daily.     . famotidine (PEPCID) 40 MG tablet Take 1 tablet by mouth daily.    Marland Kitchen ipratropium (ATROVENT) 0.06 % nasal spray Place 2 sprays into the nose 4 (four) times daily. If needed (Patient taking differently: Place 2 sprays into the nose daily as needed. If needed) 45 mL 3  . levothyroxine (SYNTHROID, LEVOTHROID) 50 MCG tablet Take 50 mcg by mouth daily before breakfast.    . metoprolol (TOPROL-XL) 50 MG 24 hr tablet Take 50 mg by mouth 2 (two) times daily.     . Multiple Vitamins-Minerals (ICAPS AREDS 2) CAPS Take 1 capsule by mouth 2 (two) times daily.    . predniSONE (DELTASONE) 20 MG tablet 2 tabs po daily x 4 days 8 tablet 0  . tiotropium (SPIRIVA HANDIHALER) 18 MCG inhalation  capsule Place 1 capsule (18 mcg total) into inhaler and inhale daily. 90 capsule 3  . umeclidinium bromide (INCRUSE ELLIPTA) 62.5 MCG/INH AEPB Inhale 1 puff into the lungs daily. 30 each 11   No current facility-administered medications on file prior to visit.

## 2016-06-26 ENCOUNTER — Telehealth: Payer: Self-pay | Admitting: Internal Medicine

## 2016-06-26 MED ORDER — BENZONATATE 200 MG PO CAPS: 200.0000 mg | ORAL_CAPSULE | Freq: Three times a day (TID) | ORAL | 1 refills | Status: DC | PRN

## 2016-06-26 NOTE — Telephone Encounter (Signed)
Ok to refill x 2 as noted

## 2016-06-26 NOTE — Telephone Encounter (Signed)
Duplicate msg.

## 2016-06-26 NOTE — Telephone Encounter (Signed)
lmtcb x2 for pt. 

## 2016-06-26 NOTE — Telephone Encounter (Signed)
Pt walked in asking for refill on tessalon  I advised that this was okayed per prior PN  Not sure why it was not sent yet? She asked that we send it to [facility]  I have sent this in and nothing further needed

## 2016-06-26 NOTE — Telephone Encounter (Signed)
Closing this encounter, as there is another message open in regards to refills.

## 2016-06-26 NOTE — Telephone Encounter (Signed)
lmomtcb x1 

## 2016-07-17 ENCOUNTER — Ambulatory Visit (INDEPENDENT_AMBULATORY_CARE_PROVIDER_SITE_OTHER): Payer: Medicare Other | Admitting: Internal Medicine

## 2016-07-17 VITALS — BP 138/70 | HR 60 | Ht 60.0 in | Wt 108.8 lb

## 2016-07-17 DIAGNOSIS — D508 Other iron deficiency anemias: Secondary | ICD-10-CM | POA: Diagnosis not present

## 2016-07-17 DIAGNOSIS — A310 Pulmonary mycobacterial infection: Secondary | ICD-10-CM

## 2016-07-17 DIAGNOSIS — J449 Chronic obstructive pulmonary disease, unspecified: Secondary | ICD-10-CM

## 2016-07-17 MED ORDER — ALBUTEROL SULFATE HFA 108 (90 BASE) MCG/ACT IN AERS: 2.0000 | INHALATION_SPRAY | Freq: Four times a day (QID) | RESPIRATORY_TRACT | 3 refills | Status: DC | PRN

## 2016-07-17 NOTE — Progress Notes (Signed)
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 Nodule, thyroid nodule, CAD. We started triple therapy with clarithromycin 250 mg x2, rifampin 300 mg x2 and Myambutol 400 mg x3, each taken 3 days per week beginning 08/08/2011/ end 03/26/12. Sputum AFB Cx + again for [facility] 11/28/14  -----------------------------------------------------------------------------------  01/15/2016-83 year old female former smoker, retired [employer] fundraiser, followed for COPD, bronchiectasis/+MAIC, ? Nodule Complicated by thyroid nodule, CAD, CKD FOLLOWS FOR: Pt states she has cough-worse in the morning, drippy nose Morning cough when she first gets up and starts moving. Little sputum. No fever or sweat. Complains of postnasal drainage. Ipratropium nasal spray helps some but using just 1 puff once daily. CXR 03/22/2015- Stable nodules mid and lower zones. No acute cardiopulmonary abnormality.   07/17/16-83 year old female former smoker, retired [employer] fundraiser, followed for COPD, bronchiectasis/+MAIC, ? Nodule Complicated by thyroid nodule, CAD, CKD Very easy shortness of breath with exertion-across room. Thinks she is worse than a month ago but continues water aerobics. Notices dyspnea climbing the steps to get into the facility. Occasional spells of coughing. Denies fever, sweat, chest pain. Does feel occasional pains in left upper quadrant. Incruse inhaler did not help, but she thinks Spiriva does. Anemic as of 06/09/16-hemoglobin 10.8. Also renal insufficiency with creatinine 1.87. Legs numb and jump, perhaps related to known vertebral compression fracture.   ROS-See HPI   + = pos Constitutional:   No-   weight loss, night sweats, fevers, chills, fatigue, lassitude. HEENT:   No-  headaches, difficulty swallowing, tooth/dental problems, sore throat,       No-  sneezing, itching, ear ache, nasal congestion, +post nasal drip,  CV:  No-   chest pain, orthopnea, PND, swelling in lower extremities, anasarca,  dizziness, palpitations Resp: +  shortness of breath with exertion, Not or at rest.           + cough,  + non-productive cough,  No- coughing up of blood.             in color of mucus.  No- wheezing.   Skin:   No- rash or lesions. GI:  No-heartburn, indigestion, no-abdominal pain, nausea, vomiting, GU: . MS:  No-   joint pain or swelling.   Neuro-     nothing unusual Psych:  No- change in mood or affect. No depression or anxiety.  No memory loss.  OBJ General- Alert, Oriented, Affect-appropriate, Distress- none acute; petite elderly woman. She appears well. Skin- rash-none,excoriation- none.  Lymphadenopathy- none Head- atraumatic            Eyes- Gross vision intact, PERRLA, conjunctivae clear secretions            Ears- +hard of hearing            Nose- Clear, no-Septal dev, mucus, polyps, erosion, perforation             Throat- Mallampati II , mucosa clear , drainage- none  tonsils- atrophic Neck- flexible , trachea midline, no stridor , thyroid+ nodule R, carotid no bruit Chest - symmetrical excursion , unlabored           Heart/CV- RRR , murmur +2 systolic , no gallop  , no rub, nl s1 s2                           - JVD- none , edema- none, stasis changes- none, varices- none           Lung- +  few crackles left base, unlabored, cough-none , dullness-none, rub- none           Chest wall-+ kyphosis and loss of vertical height Abd-  Br/ Gen/ Rectal- Not done, not indicated Extrem- cyanosis- none, clubbing, none, atrophy- none, strength- nl Neuro- grossly intact to observation

## 2016-07-17 NOTE — Patient Instructions (Addendum)
Order- walk test on room air     Dx dyspnea on exertion  Script sent for Ventolin to mail order

## 2016-07-21 ENCOUNTER — Encounter: Payer: Self-pay | Admitting: Internal Medicine

## 2016-07-21 DIAGNOSIS — D649 Anemia, unspecified: Secondary | ICD-10-CM | POA: Insufficient documentation

## 2016-07-21 NOTE — Assessment & Plan Note (Signed)
I don't think this has changed very much. Recent imaging showed no change to suggest active infection. Okay to specify Ventolin rescue inhaler she uses occasionally

## 2016-07-21 NOTE — Assessment & Plan Note (Signed)
I don't know proposed mechanism but hemoglobin of 10.8 recorded 06/09/16. I explained that anemia can contribute to dyspnea.

## 2016-07-21 NOTE — Assessment & Plan Note (Signed)
She denies fever or sweats and chest x-rays have been stable. I don't think we are dealing with a significant active process right now.

## 2016-07-22 ENCOUNTER — Telehealth: Payer: Self-pay | Admitting: Internal Medicine

## 2016-07-22 DIAGNOSIS — J9611 Chronic respiratory failure with hypoxia: Secondary | ICD-10-CM

## 2016-07-22 DIAGNOSIS — J449 Chronic obstructive pulmonary disease, unspecified: Secondary | ICD-10-CM

## 2016-07-22 NOTE — Telephone Encounter (Signed)
lmtcb X1 for pt  

## 2016-07-24 NOTE — Telephone Encounter (Signed)
Spoke with pt, [facility] that does not make sense to her and does not wish to keep this- pt is requesting an order for a POC only.  Also, pt requesting clarification if she needs to wear this qhs or only with exertion.    CY please advise if ok to order a POC for pt.  Thanks.

## 2016-07-24 NOTE — Telephone Encounter (Signed)
[facility] Pt is aware and voiced her understanding. Nothing further needed.

## 2016-07-24 NOTE — Telephone Encounter (Signed)
Please order [facility]ME [facility]-  Patient does not want home-fill O2.    Please replace with O2 2L sleep and exertion 2L POC per patient request for dx COPD mixed type, chronic hypoxic respiratory failure.

## 2016-08-01 ENCOUNTER — Telehealth: Payer: Self-pay | Admitting: Internal Medicine

## 2016-08-01 NOTE — Telephone Encounter (Signed)
Sent message to [facility] to see what the hold up is Tobe SosSally E Ottinger

## 2016-08-01 NOTE — Telephone Encounter (Signed)
Message back from [place]jason [email or web address]  this is being taken care of today could not reach pt Deanna SosSally E Ottinger

## 2016-10-15 ENCOUNTER — Telehealth: Payer: Self-pay | Admitting: Internal Medicine

## 2016-10-15 DIAGNOSIS — J449 Chronic obstructive pulmonary disease, unspecified: Secondary | ICD-10-CM

## 2016-10-15 NOTE — Telephone Encounter (Signed)
Patient returned call, CB is [phone].

## 2016-10-15 NOTE — Telephone Encounter (Signed)
lmtcb for pt on both numbers.  

## 2016-10-15 NOTE — Telephone Encounter (Signed)
Patient is aware of CY's recs. [facility]. She verbalized understanding. Nothing else needed at time of call.

## 2016-10-15 NOTE — Telephone Encounter (Signed)
Spoke with patient. She stated that she has not used her oxygen tanks in nearly 3 months. She wants [facility] to come by and pick up the tanks.   CY, are ok with us placing this order? Please advise. Thanks!

## 2016-10-15 NOTE — Telephone Encounter (Signed)
Ok to dc oxygen since patient is not using it.

## 2016-12-27 ENCOUNTER — Telehealth: Payer: Self-pay | Admitting: Internal Medicine

## 2016-12-27 MED ORDER — UMECLIDINIUM BROMIDE 62.5 MCG/INH IN AEPB: 1.0000 | INHALATION_SPRAY | Freq: Every day | RESPIRATORY_TRACT | 3 refills | Status: DC

## 2016-12-27 NOTE — Telephone Encounter (Signed)
Spoke with the pt  She states needing refill on her incruse inhaler sent to [facility]  Rx was sent Nothing further needed per pt

## 2017-01-17 ENCOUNTER — Ambulatory Visit (INDEPENDENT_AMBULATORY_CARE_PROVIDER_SITE_OTHER): Payer: Medicare Other | Admitting: Internal Medicine

## 2017-01-17 ENCOUNTER — Encounter: Payer: Self-pay | Admitting: Internal Medicine

## 2017-01-17 VITALS — BP 128/72 | HR 33

## 2017-01-17 DIAGNOSIS — R079 Chest pain, unspecified: Secondary | ICD-10-CM | POA: Insufficient documentation

## 2017-01-17 DIAGNOSIS — J449 Chronic obstructive pulmonary disease, unspecified: Secondary | ICD-10-CM | POA: Diagnosis not present

## 2017-01-17 DIAGNOSIS — R0789 Other chest pain: Secondary | ICD-10-CM

## 2017-01-17 DIAGNOSIS — R001 Bradycardia, unspecified: Secondary | ICD-10-CM | POA: Diagnosis not present

## 2017-01-17 MED ORDER — IPRATROPIUM BROMIDE 0.06 % NA SOLN: 2.0000 | Freq: Four times a day (QID) | NASAL | 3 refills | Status: DC

## 2017-01-17 MED ORDER — FAMOTIDINE 40 MG PO TABS: 40.0000 mg | ORAL_TABLET | Freq: Every day | ORAL | 0 refills | Status: AC

## 2017-01-17 MED ORDER — BENZONATATE 200 MG PO CAPS: 200.0000 mg | ORAL_CAPSULE | Freq: Three times a day (TID) | ORAL | 4 refills | Status: AC | PRN

## 2017-01-17 NOTE — Assessment & Plan Note (Signed)
I do not think there has been a change in status.  We discussed, and decided not to repeat a CXR.  She will continue present meds and keep PCP appointment.

## 2017-01-17 NOTE — Assessment & Plan Note (Signed)
Right upper anterior chest and right shoulder pain last night lasting about an hour and relieved by Tylenol.  EKG today without acute changes.  This records sinus rhythm heart rate 66/min.  Nurse reported slower pulse to palpation and pulse oximeter but that probably reflected low pulse pressure on arrival.  I emphasized that if she has more of this discomfort she needs to report it to her PCP immediately, or go to an ED.

## 2017-01-17 NOTE — Patient Instructions (Signed)
Keep appointments with your other doctors as planned  [facility]  Refills sent for the atrovent nasal spray, tessalon and famotadine. Get your primary doctor to extend the refill for famotadine.   Please call if we can help with your breathing

## 2017-01-17 NOTE — Progress Notes (Signed)
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 Nodule, thyroid nodule, CAD. We started triple therapy with clarithromycin 250 mg x2, rifampin 300 mg x2 and Myambutol 400 mg x3, each taken 3 days per week beginning 08/08/2011/ end 03/26/12. 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 Nodule Complicated by thyroid nodule, CAD, CKD Very easy shortness of breath with exertion-across room. Thinks she is worse than a month ago but continues water aerobics. Notices dyspnea climbing the steps to get into the facility. Occasional spells of coughing. Denies fever, sweat, chest pain. Does feel occasional pains in left upper quadrant. Incruse inhaler did not help, but she thinks Spiriva does. Anemic as of 06/09/16-hemoglobin 10.8. Also renal insufficiency with creatinine 1.87. Legs numb and jump, perhaps related to known vertebral compression fracture.  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 Pt states her breathing has gotten worse(SOB) since last visit. HR today by palpation and pulse-ox= 33>> EKG. Had R CP last night- resolved. She feels a little out of sorts/weak today but cannot be more specific.  Denies pain or shortness of breath.  A little lightheaded if she stands quickly, without syncope or palpitation.  Pending appointments with her PCP and nephrologist.  Coughing a bit more than usual, mostly dry or white sputum, no fever or purulent sputum.  Inhalers okay.  Asks refill Tessalon.  Describes the chest pain last night as not exertional, involving right upper anterior chest wall and right  shoulder, lasting about an hour, resolved by Tylenol and gone when she woke in the morning. EKG- RSR, 1degAVB, 66/ min. CXR 06/09/16- IMPRESSION: COPD.  No active disease. Stable small hiatal hernia.  Aortic atherosclerosis.  ROS-See HPI   + = pos Constitutional:   No-   weight loss, night sweats, fevers, chills, fatigue, lassitude. HEENT:   No-  headaches, difficulty swallowing, tooth/dental problems, sore throat,       No-  sneezing, itching, ear ache, nasal congestion, +post nasal drip,  CV:  +chest pain, orthopnea, PND, swelling in lower extremities, anasarca, dizziness, palpitations Resp: +  shortness of breath with exertion, Not or at rest.           +productive  cough,  + non-productive cough,  No- coughing up of blood.            No change in color of mucus.  No- wheezing.   Skin:   No- rash or lesions. GI:  No-heartburn, indigestion, no-abdominal pain, nausea, vomiting, GU: . MS:  No-   joint pain or swelling.   Neuro-     nothing unusual Psych:  No- change in mood or affect. No depression or anxiety.  No memory loss.  OBJ General- Alert, Oriented, Affect-appropriate, Distress- none acute; petite elderly woman.  Skin- rash-none,excoriation- none.  Lymphadenopathy- none Head- atraumatic            Eyes- Gross vision intact, PERRLA, conjunctivae clear secretions            Ears- +hard of hearing            Nose- Clear, no-Septal dev, mucus, polyps, erosion, perforation             Throat- Mallampati II , mucosa  clear , drainage- none  tonsils- atrophic Neck- flexible , trachea midline, no stridor , thyroid+ nodule R, carotid no bruit Chest - symmetrical excursion , unlabored           Heart/CV- RRR , murmur +2 systolic , no gallop  , no rub, nl s1 s2                           - JVD- none , edema- none, stasis changes- none, varices- none           Lung- + few crackles scattered, unlabored, cough-none , dullness-none, rub- none           Chest wall-+ kyphosis and loss of  vertical height Abd-  Br/ Gen/ Rectal- Not done, not indicated Extrem- cyanosis- none, clubbing, none, atrophy- none, strength- nl Neuro- grossly intact to observation

## 2017-07-17 ENCOUNTER — Ambulatory Visit (INDEPENDENT_AMBULATORY_CARE_PROVIDER_SITE_OTHER): Payer: Medicare Other | Admitting: Internal Medicine

## 2017-07-17 ENCOUNTER — Encounter: Payer: Self-pay | Admitting: Internal Medicine

## 2017-07-17 VITALS — BP 136/76 | HR 71 | Ht 60.0 in | Wt 104.0 lb

## 2017-07-17 DIAGNOSIS — F329 Major depressive disorder, single episode, unspecified: Secondary | ICD-10-CM | POA: Diagnosis not present

## 2017-07-17 DIAGNOSIS — J4489 Other specified chronic obstructive pulmonary disease: Secondary | ICD-10-CM

## 2017-07-17 DIAGNOSIS — F32A Depression, unspecified: Secondary | ICD-10-CM

## 2017-07-17 DIAGNOSIS — J449 Chronic obstructive pulmonary disease, unspecified: Secondary | ICD-10-CM | POA: Diagnosis not present

## 2017-07-17 DIAGNOSIS — F419 Anxiety disorder, unspecified: Secondary | ICD-10-CM

## 2017-07-17 NOTE — Progress Notes (Signed)
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 Nodule, thyroid nodule, CAD. We started triple therapy with clarithromycin 250 mg x2, rifampin 300 mg x2 and Myambutol 400 mg x3, each taken 3 days per week beginning 08/08/2011/ end 03/26/12. 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 Pt states her breathing has gotten worse(SOB) since last visit. HR today by palpation and pulse-ox= 33>> EKG. Had R CP last night- resolved. She feels a little out of sorts/weak today but cannot be more specific.  Denies pain or shortness of breath.  A little lightheaded if she stands quickly, without syncope or palpitation.  Pending appointments with her PCP and nephrologist.  Coughing a bit more than usual, mostly dry or white sputum, no fever or purulent sputum.  Inhalers okay.  Asks refill Tessalon.  Describes the chest pain last night as not exertional, involving right upper anterior chest wall and right shoulder, lasting about an hour, resolved by Tylenol and gone when she woke in the morning. EKG- RSR, 1degAVB, 66/ min. CXR 06/09/16- IMPRESSION: COPD.  No active disease. Stable small hiatal hernia.  Aortic atherosclerosis.  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 Nodule, Complicated by thyroid nodule, CAD, CKD, glaucoma, HBP O2 2L ----COPD: Pt notes recent falls-was checked out by PCP-no broken bones. Pt states her breathing has been doing well overall.  Incruse, albuterol hfa,  She comes in today asking for psychiatric referral for anxiety and depression-" someone who can prescribe".  Describes a  number of situational stress issues related to contractual difficulties with assisted living.  She may mostly need a Child psychotherapist.  Refers to death of son-not recent. Breathing has been comfortable with no acute issues.  Minimal cough now, little sputum, no fever.  Some dyspnea on exertion only if more active than usual.  ROS-See HPI   + = positive  Constitutional:   No-   weight loss, night sweats, fevers, chills, fatigue, lassitude. HEENT:   No-  headaches, difficulty swallowing, tooth/dental problems, sore throat,       No-  sneezing, itching, ear ache, nasal congestion, +post nasal drip,  CV:  +chest pain, orthopnea, PND, swelling in lower extremities, anasarca, dizziness, palpitations Resp: +  shortness of breath with exertion, Not or at rest.           +productive  cough,  + non-productive cough,  No- coughing up of blood.            No change in color of mucus.  No- wheezing.   Skin:   No- rash or lesions. GI:  No-heartburn, indigestion, no-abdominal pain, nausea, vomiting, GU: . MS:  No-   joint pain or swelling.   Neuro-     nothing unusual Psych:  No- change in mood or affect. No depression or anxiety.  No memory loss.  OBJ General- Alert, Oriented, Affect-appropriate, Distress- none acute; petite elderly woman.  Skin- rash-none,excoriation- none.  Lymphadenopathy- none Head- atraumatic            Eyes- Gross vision intact, PERRLA, conjunctivae clear secretions            Ears- +hard of hearing  Nose- Clear, no-Septal dev, mucus, polyps, erosion, perforation             Throat- Mallampati II , mucosa clear , drainage- none  tonsils- atrophic Neck- flexible , trachea midline, no stridor , thyroid+ nodule R, carotid no bruit Chest - symmetrical excursion , unlabored           Heart/CV- RRR , murmur +2 systolic , no gallop  , no rub, nl s1 s2                           - JVD- none , edema- none, stasis changes- none, varices- none           Lung- + few crackles  scattered, unlabored, cough-none , dullness-none, rub- none           Chest wall-+ kyphosis and loss of vertical height Abd-  Br/ Gen/ Rectal- Not done, not indicated Extrem- cyanosis- none, clubbing, none, atrophy- none, strength- nl Neuro- grossly intact to observation

## 2017-07-17 NOTE — Assessment & Plan Note (Signed)
Clinically stable with little complaint of cough and fairly quiet chest on exam now.  She is probably at her baseline. She will let us know if this status changes.

## 2017-07-17 NOTE — Assessment & Plan Note (Signed)
She is asking specifically for psychiatric referral.  What she is describing to me sounds like situational depression with needs for support navigating issues of finance and assisted living as she gets older.  We are giving contact information for behavioral medicine.  She will want to discuss this with her PCP as well.

## 2017-07-17 NOTE — Patient Instructions (Signed)
We can continue current meds. Please call for refills when needed.  Referral to Behavioral Health  Dx anxiety/ depression  Please call if we can help

## 2017-08-28 ENCOUNTER — Encounter: Payer: Self-pay | Admitting: Physical Therapy

## 2017-08-28 ENCOUNTER — Other Ambulatory Visit: Payer: Self-pay

## 2017-08-28 ENCOUNTER — Ambulatory Visit: Payer: Medicare Other | Attending: Orthopedic Surgery | Admitting: Physical Therapy

## 2017-08-28 DIAGNOSIS — M25611 Stiffness of right shoulder, not elsewhere classified: Secondary | ICD-10-CM | POA: Insufficient documentation

## 2017-08-28 DIAGNOSIS — M25511 Pain in right shoulder: Secondary | ICD-10-CM | POA: Insufficient documentation

## 2017-08-28 DIAGNOSIS — R2681 Unsteadiness on feet: Secondary | ICD-10-CM | POA: Insufficient documentation

## 2017-08-28 DIAGNOSIS — R29898 Other symptoms and signs involving the musculoskeletal system: Secondary | ICD-10-CM | POA: Insufficient documentation

## 2017-08-28 DIAGNOSIS — M6281 Muscle weakness (generalized): Secondary | ICD-10-CM | POA: Diagnosis present

## 2017-08-28 NOTE — Therapy (Signed)
[facility] [address]  [address] [place], [place], [address] Phone: [phone]   Fax:  [phone]  Physical Therapy Evaluation  Patient Details  Name: [name] MRN: [id number] Date of Birth: [birth year] Referring Provider: Lunette Stands, MD   Encounter Date: 08/28/2017  PT End of Session - 08/28/17 0959    Visit Number  1    Number of Visits  13    Date for PT Re-Evaluation  10/09/17    Authorization Type  Medicare & UMR    PT Start Time  0802    PT Stop Time  0852    PT Time Calculation (min)  50 min    Activity Tolerance  Patient limited by pain;Patient tolerated treatment well    Behavior During Therapy  [facility] for tasks assessed/performed       Past Medical History:  Diagnosis Date  . Abnormal heart rhythm    pt not sure what kind.  . Anemia   . Arthritis   . COPD (chronic obstructive pulmonary disease) (HCC)   . Dry skin   . GERD (gastroesophageal reflux disease)   . Hypertension   . Hypothyroidism   . Irritable bowel syndrome   . Kidney disease   . Pneumonia   . Shortness of breath   . Weight loss    10-12 lbs in 2 months (03/2011)    Past Surgical History:  Procedure Laterality Date  . ANTERIOR AND POSTERIOR VAGINAL REPAIR    . CARDIAC CATHETERIZATION     6 years ago  . COLONOSCOPY    . ESOPHAGOGASTRODUODENOSCOPY    . EYE SURGERY Bilateral 09/2013   cataract surgery with lens implant  . THYROIDECTOMY     Left  . TOE AMPUTATION Left    2nd toe  . TONSILLECTOMY    . TOTAL HIP ARTHROPLASTY  04/24/2011   Procedure: TOTAL HIP ARTHROPLASTY;  Surgeon: Nestor Lewandowsky, MD;  Location: [facility] OR;  Service: Orthopedics;  Laterality: Right;  DEPUY/PINNACLE CUP, SUMMIT BASIC STEM  . TOTAL HIP ARTHROPLASTY Left 04/20/2014   Procedure: TOTAL HIP ARTHROPLASTY;  Surgeon: Nestor Lewandowsky, MD;  Location: [facility] OR;  Service: Orthopedics;  Laterality: Left;    There were no vitals filed for this visit.   Subjective  Assessment - 08/28/17 0805    Subjective  Patient reports she had a fall 6 weeks ago- fell on R shoulder and ribs. Had instant pain- went to urgent care where they x-rayed ribs but found no fracture. Sports MD x-rayed R shoulder- patient does not recall results but reports they diagnosed her with shoulder sprain. Reports since the time of her fall symptoms have gotten better. Reports intermittent catching and pain in anterior R shoulder radiating to R hand, trouble reaching overhead to press garage button, pushing down the toaster, vacuuming, tucking in sheets. Denies N/T. Pain at best 0/10, 5-6/10.    Pertinent History  B THA, PVD, COPD, thyroid nodule, CAD, bronchiectasis     Limitations  House hold activities;Lifting    Diagnostic tests  per-patient had xray 08/20/17 but not sure of results     Patient Stated Goals  pressing garage button without pain    Currently in Pain?  Yes    Pain Score  3     Pain Location  Shoulder    Pain Orientation  Right;Anterior    Pain Descriptors / Indicators  Sharp    Pain Type  Acute pain    Aggravating  Factors   reaching overhead, pushing    Pain Relieving Factors  tramadol          [facility] PT Assessment - 08/28/17 0826      Assessment   Medical Diagnosis  R shoulder sprain    Referring Provider  Lunette Stands, MD    Onset Date/Surgical Date  07/17/17    Hand Dominance  Right    Next MD Visit  -- end of July    Prior Therapy  Yes- for back      Precautions   Precautions  None      Restrictions   Weight Bearing Restrictions  No      Balance Screen   Has the patient fallen in the past 6 months  Yes    How many times?  4-5 turning or stepping backwards    Has the patient had a decrease in activity level because of a fear of falling?   Yes    Is the patient reluctant to leave their home because of a fear of falling?   Yes somewhat      Home Environment   Living Environment  Private residence    Living Arrangements  Alone    Available Help at  Discharge  Neighbor    Type of Home  House townhome    Home Access  Stairs to enter    Entrance Stairs-Number of Steps  1    Entrance Stairs-Rails  None    Home Layout  One level    Home Equipment  [place] - single point;Walker - 4 wheels      Prior Function   Level of Independence  Independent    Vocation  Retired      Copy Status  Within Functional Limits for tasks assessed      Observation/Other Assessments   Focus on Therapeutic Outcomes (FOTO)   Shoulder: 53 (47% limited, 39% predicted)      Sensation   Light Touch  Appears Intact reports decreased in lateral 4 digits on L hand      Coordination   Gross Motor Movements are Fluid and Coordinated  Yes decreased speed      Posture/Postural Control   Posture/Postural Control  Postural limitations    Postural Limitations  Rounded Shoulders;Forward head;Increased thoracic kyphosis severely kyphotic      ROM / Strength   AROM / PROM / Strength  AROM;PROM;Strength      AROM   AROM Assessment Site  Shoulder    Right/Left Shoulder  Left;Right    Right Shoulder Flexion  135 Degrees    Right Shoulder ABduction  85 Degrees    Right Shoulder Internal Rotation  52 Degrees    Right Shoulder External Rotation  72 Degrees    Left Shoulder Flexion  135 Degrees    Left Shoulder ABduction  160 Degrees    Left Shoulder Internal Rotation  40 Degrees    Left Shoulder External Rotation  79 Degrees      PROM   PROM Assessment Site  Shoulder    Right/Left Shoulder  Right;Left    Right Shoulder Flexion  138 Degrees pain    Right Shoulder ABduction  110 Degrees pain    Right Shoulder Internal Rotation  55 Degrees    Right Shoulder External Rotation  84 Degrees    Left Shoulder Flexion  152 Degrees    Left Shoulder ABduction  164 Degrees    Left Shoulder Internal Rotation  43 Degrees  Left Shoulder External Rotation  83 Degrees      Strength   Strength Assessment Site  Shoulder    Right/Left Shoulder   Right;Left    Right Shoulder Flexion  4/5    Right Shoulder ABduction  3/5 pain and crunching     Right Shoulder Internal Rotation  3+/5 pain in ant shoulder and ERs    Right Shoulder External Rotation  4-/5    Left Shoulder Flexion  4+/5    Left Shoulder ABduction  4+/5    Left Shoulder Internal Rotation  3+/5    Left Shoulder External Rotation  4-/5      Palpation   Palpation comment  TTP in superior biceps tendon      Transfers   Transfers  Sit to Supine    Sit to Supine  6: Modified independent (Device/Increase time) difficulty and decreased speed      Ambulation/Gait   Ambulation/Gait  Yes    Ambulation/Gait Assistance  6: Modified independent (Device/Increase time)    Assistive device  None    Gait Pattern  Step-through pattern;Decreased step length - right;Decreased step length - left severely decreased speed    Gait velocity  decreased speed                Objective measurements completed on examination: See above findings.              PT Education - 08/28/17 0959    Education Details  prognosis, POC, HEP    Person(s) Educated  Patient    Methods  Explanation;Demonstration;Tactile cues;Verbal cues;Handout    Comprehension  Returned demonstration;Verbalized understanding       PT Short Term Goals - 08/28/17 1008      PT SHORT TERM GOAL #1   Title  Patient to be independent with initial HEP.    Time  3    Period  Weeks    Status  New    Target Date  10/09/17        PT Long Term Goals - 08/28/17 1008      PT LONG TERM GOAL #1   Title  Patient to be independent with advanced HEP.    Time  6    Period  Weeks    Status  New    Target Date  10/09/17      PT LONG TERM GOAL #2   Title  Patient to demonstrate >=4/5 strength in R shoulder without pain limiting.    Time  6    Period  Weeks    Status  New    Target Date  10/09/17      PT LONG TERM GOAL #3   Title  Patient to demonstrate R shoulder AROM/PROM [facility] and symmetrical to  opposite side.    Time  6    Period  Weeks    Status  New    Target Date  10/09/17      PT LONG TERM GOAL #4   Title  Patient to report tolerance of R shoulder overhead reaching to press garage button at home.     Time  6    Period  Weeks    Status  New    Target Date  10/09/17      PT LONG TERM GOAL #5   Title  Patient to demonstrate TUG <14 sec with LRAD without evidence of instability to decreased risk of falls.     Time  6    Period  Weeks  Status  New    Target Date  10/09/17             Plan - 08/28/17 1002    Clinical Impression Statement  Patient is an 84y/o F presenting to OPPT with c/o R shoulder pain after a fall to her R side 6 weeks ago. Reports sports MD x-rayed R shoulder- patient does not recall results but reports they diagnosed her with shoulder sprain. Current symptoms include intermittent catching and pain in anterior R shoulder radiating to R hand, trouble reaching overhead to press garage button, pushing down the toaster, vacuuming, tucking in sheets. Patient also recalls 4-5 falls in the last 6 months. Today with tenderness to palpation at R biceps tendon, decreased R shoulder AROM/PROM with most pain in abduction, and decreased R shoulder strength, also with severely kyphotic posture that likely exacerbates shoulder pain. Patient educated on gentle HEP for shoulder AAROM and scapular strengthening; advised not to push into painful range. Also advised patient to use SPC to avoid falls. Patient reported understanding.    Clinical Presentation  Stable    Clinical Decision Making  Low    Rehab Potential  Good    Clinical Impairments Affecting Rehab Potential  B THA, PVD, COPD, thyroid nodule, CAD, bronchiectasis     PT Frequency  2x / week    PT Duration  6 weeks    PT Treatment/Interventions  ADLs/Self Care Home Management;Cryotherapy;Electrical Stimulation;Moist Heat;Ultrasound;Gait training;Stair training;Functional mobility training;Therapeutic  activities;Therapeutic exercise;Manual techniques;Patient/family education;Balance training;Neuromuscular re-education;Passive range of motion;Dry needling;Energy conservation;Splinting;Taping;Vasopneumatic Device    PT Next Visit Plan  reassess HEP, TUG    Consulted and Agree with Plan of Care  Patient       Patient will benefit from skilled therapeutic intervention in order to improve the following deficits and impairments:  Pain, Impaired UE functional use, Decreased strength, Decreased activity tolerance, Decreased balance, Decreased mobility, Difficulty walking, Improper body mechanics, Decreased range of motion, Impaired flexibility, Postural dysfunction  Visit Diagnosis: Acute pain of right shoulder  Stiffness of right shoulder, not elsewhere classified  Muscle weakness (generalized)  Unsteadiness on feet  Other symptoms and signs involving the musculoskeletal system     Problem List Patient Active Problem List   Diagnosis Date Noted  . Anxiety and depression 07/17/2017  . Chest pain of uncertain etiology 01/17/2017  . Anemia 07/21/2016  . Arthritis, hip 04/20/2014  . Can't get food down 02/01/2014  . H/O cataract extraction 09/02/2013  . Age-related macular degeneration, dry 08/03/2013  . Glaucoma suspect 08/03/2013  . DDD (degenerative disc disease), lumbosacral 09/25/2012  . External hemorrhoid 09/25/2012  . Pain in joint, ankle and foot 08/10/2012  . Contracture of toe of left foot 08/10/2012  . Atherosclerosis of renal artery (HCC) 08/08/2012  . OP (osteoporosis) 08/08/2012  . Rhinitis, nonallergic, chronic 01/19/2012  . GERD (gastroesophageal reflux disease) 01/19/2012  . Adult hypothyroidism 11/14/2011  . Atypical mycobacterial infection of lung (HCC) 07/03/2011  . Lung nodule 07/03/2011  . Osteoarthritis of right hip 04/26/2011  . Aseptic necrosis (HCC) 01/27/2011  . COPD (chronic obstructive pulmonary disease) with chronic bronchitis (HCC) 12/17/2010     Anette Guarneri, PT, DPT 08/28/17 10:14 AM   [facility] [address]  [address] [place], [place], [address] Phone: [phone]   Fax:  [phone]  Name: DALISHA SHIVELY MRN: [id number] Date of Birth: January 13, 1929

## 2017-09-02 ENCOUNTER — Ambulatory Visit: Payer: Medicare Other | Attending: Orthopedic Surgery

## 2017-09-02 DIAGNOSIS — M25511 Pain in right shoulder: Secondary | ICD-10-CM | POA: Insufficient documentation

## 2017-09-02 DIAGNOSIS — M6281 Muscle weakness (generalized): Secondary | ICD-10-CM | POA: Diagnosis present

## 2017-09-02 DIAGNOSIS — R29898 Other symptoms and signs involving the musculoskeletal system: Secondary | ICD-10-CM | POA: Insufficient documentation

## 2017-09-02 DIAGNOSIS — R2681 Unsteadiness on feet: Secondary | ICD-10-CM | POA: Diagnosis present

## 2017-09-02 DIAGNOSIS — M25611 Stiffness of right shoulder, not elsewhere classified: Secondary | ICD-10-CM | POA: Insufficient documentation

## 2017-09-02 NOTE — Therapy (Signed)
[facility] [address]  [address] [place], [place], [address] Phone: [phone]   Fax:  [phone]  Physical Therapy Treatment  Patient Details  Name: [name] MRN: [id number] Date of Birth: 1928/05/24 Referring Provider: Lunette Stands, MD   Encounter Date: 09/02/2017  PT End of Session - 09/02/17 0931    Visit Number  2    Number of Visits  13    Date for PT Re-Evaluation  10/09/17    Authorization Type  Medicare & UMR    PT Start Time  [phone]    PT Stop Time  1023 ended session with 10 min moist heat    PT Time Calculation (min)  60 min    Activity Tolerance  Patient limited by pain;Patient tolerated treatment well    Behavior During Therapy  [facility] for tasks assessed/performed       Past Medical History:  Diagnosis Date  . Abnormal heart rhythm    pt not sure what kind.  . Anemia   . Arthritis   . COPD (chronic obstructive pulmonary disease) (HCC)   . Dry skin   . GERD (gastroesophageal reflux disease)   . Hypertension   . Hypothyroidism   . Irritable bowel syndrome   . Kidney disease   . Pneumonia   . Shortness of breath   . Weight loss    10-12 lbs in 2 months (03/2011)    Past Surgical History:  Procedure Laterality Date  . ANTERIOR AND POSTERIOR VAGINAL REPAIR    . CARDIAC CATHETERIZATION     6 years ago  . COLONOSCOPY    . ESOPHAGOGASTRODUODENOSCOPY    . EYE SURGERY Bilateral 09/2013   cataract surgery with lens implant  . THYROIDECTOMY     Left  . TOE AMPUTATION Left    2nd toe  . TONSILLECTOMY    . TOTAL HIP ARTHROPLASTY  04/24/2011   Procedure: TOTAL HIP ARTHROPLASTY;  Surgeon: Nestor Lewandowsky, MD;  Location: [facility] OR;  Service: Orthopedics;  Laterality: Right;  DEPUY/PINNACLE CUP, SUMMIT BASIC STEM  . TOTAL HIP ARTHROPLASTY Left 04/20/2014   Procedure: TOTAL HIP ARTHROPLASTY;  Surgeon: Nestor Lewandowsky, MD;  Location: [facility] OR;  Service: Orthopedics;  Laterality: Left;    There were no vitals filed  for this visit.  Subjective Assessment - 09/02/17 0927    Subjective  Has had some increase pain at R shoulder with HEP performance.      Pertinent History  B THA, PVD, COPD, thyroid nodule, CAD, bronchiectasis     Diagnostic tests  per-patient had xray 08/20/17 but not sure of results     Patient Stated Goals  pressing garage button without pain    Currently in Pain?  No/denies    Pain Score  0-No pain up to 4-5/10     Pain Location  Shoulder    Pain Orientation  Right;Anterior    Pain Descriptors / Indicators  Nagging "Catching" and "hard" pain     Pain Type  Acute pain    Aggravating Factors   HEP, reaching overhead     Multiple Pain Sites  Yes    Pain Score  0 up to 5/10 average     Pain Location  Back    Pain Orientation  Right;Left    Pain Descriptors / Indicators  Sharp "pulling" sometimes sharp     Pain Type  Chronic pain    Aggravating Factors   turning over in bed  Pain Relieving Factors  sitting,                        [facility] Adult PT Treatment/Exercise - 09/02/17 0946      Self-Care   Self-Care  Other Self-Care Comments    Other Self-Care Comments   discussion of pt. tolerance for HEP       Shoulder Exercises: Supine   External Rotation  Right;AAROM;10 reps Mild pain increase     External Rotation Limitations  wand     Flexion  Right;AAROM;10 reps Mild pain increase     Flexion Limitations  wand     ABduction  Right;AAROM;10 reps Mild pain increase     ABduction Limitations  wand       Shoulder Exercises: Seated   Retraction  Both;10 reps    Retraction Limitations  5" hold; tc for scap. retraction     Row  Both;10 reps;Theraband    Theraband Level (Shoulder Row)  Level 2 (Red)    Row Limitations  cues for full scap. retraction       Modalities   Modalities  Moist Heat      Moist Heat Therapy   Number Minutes Moist Heat  10 Minutes    Moist Heat Location  Shoulder R      Manual Therapy   Manual Therapy  Soft tissue  mobilization;Myofascial release    Manual therapy comments  seated     Soft tissue mobilization  STM to shoulder complex, R UT in areas of tenderness    Myofascial Release  TPR to R anterior shoulder in area of most tenderness; good response                PT Short Term Goals - 09/02/17 0932      PT SHORT TERM GOAL #1   Title  Patient to be independent with initial HEP.    Time  3    Period  Weeks    Status  On-going        PT Long Term Goals - 09/02/17 0932      PT LONG TERM GOAL #1   Title  Patient to be independent with advanced HEP.    Time  6    Period  Weeks    Status  On-going      PT LONG TERM GOAL #2   Title  Patient to demonstrate >=4/5 strength in R shoulder without pain limiting.    Time  6    Period  Weeks    Status  On-going      PT LONG TERM GOAL #3   Title  Patient to demonstrate R shoulder AROM/PROM WFL and symmetrical to opposite side.    Time  6    Period  Weeks    Status  On-going      PT LONG TERM GOAL #4   Title  Patient to report tolerance of R shoulder overhead reaching to press garage button at home.     Time  6    Period  Weeks    Status  On-going      PT LONG TERM GOAL #5   Title  Patient to demonstrate TUG <14 sec with LRAD without evidence of instability to decreased risk of falls.     Time  6    Period  Weeks    Status  On-going            Plan - 09/02/17 1610  Clinical Impression Statement  [name] noting limited tolerance for HEP wand activities at home.  Improved tolerance after cueing to avoid excessive muscular activation with R shoulder AAROM HEP today.  Some tenderness in R shoulder complex and R UT today, which responded well to manual therapy.  Did require some cueing with scapular strengthening for proper scapular motion however this improved following skilled instruction.  Ended session with some R shoulder pain thus applied moist heat with good relief following this.  Will continue to progress toward goals.       Clinical Impairments Affecting Rehab Potential  B THA, PVD, COPD, thyroid nodule, CAD, bronchiectasis     PT Treatment/Interventions  ADLs/Self Care Home Management;Cryotherapy;Electrical Stimulation;Moist Heat;Ultrasound;Gait training;Stair training;Functional mobility training;Therapeutic activities;Therapeutic exercise;Manual techniques;Patient/family education;Balance training;Neuromuscular re-education;Passive range of motion;Dry needling;Energy conservation;Splinting;Taping;Vasopneumatic Device    Consulted and Agree with Plan of Care  Patient       Patient will benefit from skilled therapeutic intervention in order to improve the following deficits and impairments:  Pain, Impaired UE functional use, Decreased strength, Decreased activity tolerance, Decreased balance, Decreased mobility, Difficulty walking, Improper body mechanics, Decreased range of motion, Impaired flexibility, Postural dysfunction  Visit Diagnosis: Acute pain of right shoulder  Stiffness of right shoulder, not elsewhere classified  Muscle weakness (generalized)     Problem List Patient Active Problem List   Diagnosis Date Noted  . Anxiety and depression 07/17/2017  . Chest pain of uncertain etiology 01/17/2017  . Anemia 07/21/2016  . Arthritis, hip 04/20/2014  . Can't get food down 02/01/2014  . H/O cataract extraction 09/02/2013  . Age-related macular degeneration, dry 08/03/2013  . Glaucoma suspect 08/03/2013  . DDD (degenerative disc disease), lumbosacral 09/25/2012  . External hemorrhoid 09/25/2012  . Pain in joint, ankle and foot 08/10/2012  . Contracture of toe of left foot 08/10/2012  . Atherosclerosis of renal artery (HCC) 08/08/2012  . OP (osteoporosis) 08/08/2012  . Rhinitis, nonallergic, chronic 01/19/2012  . GERD (gastroesophageal reflux disease) 01/19/2012  . Adult hypothyroidism 11/14/2011  . Atypical mycobacterial infection of lung (HCC) 07/03/2011  . Lung nodule 07/03/2011  .  Osteoarthritis of right hip 04/26/2011  . Aseptic necrosis (HCC) 01/27/2011  . COPD (chronic obstructive pulmonary disease) with chronic bronchitis (HCC) 12/17/2010    Kermit BaloMicah Shereka Lafortune, PTA 09/02/17 1:12 PM   [facility] [place] [facility] [address]  [address] [place], [place], [address] Phone: [phone]   Fax:  [phone]  Name: [name] MRN: [id number] Date of Birth: 12/07/1928

## 2017-09-10 ENCOUNTER — Ambulatory Visit: Payer: Medicare Other

## 2017-09-10 DIAGNOSIS — R29898 Other symptoms and signs involving the musculoskeletal system: Secondary | ICD-10-CM

## 2017-09-10 DIAGNOSIS — M25511 Pain in right shoulder: Secondary | ICD-10-CM | POA: Diagnosis not present

## 2017-09-10 DIAGNOSIS — M6281 Muscle weakness (generalized): Secondary | ICD-10-CM

## 2017-09-10 DIAGNOSIS — M25611 Stiffness of right shoulder, not elsewhere classified: Secondary | ICD-10-CM

## 2017-09-10 DIAGNOSIS — R2681 Unsteadiness on feet: Secondary | ICD-10-CM

## 2017-09-10 NOTE — Therapy (Addendum)
[facility] Outpatient Rehabilitation [facility] [address]  [address] [place], [place], [address] Phone: [phone]   Fax:  [phone]  Physical Therapy Treatment  Patient Details  Name: [name] MRN: [id number] Date of Birth: [birth year] Referring Provider: Lunette Stands, MD   Encounter Date: 09/10/2017  PT End of Session - 09/10/17 1024    Visit Number  3    Number of Visits  13    Date for PT Re-Evaluation  10/09/17    Authorization Type  Medicare & UMR    PT Start Time  1021    PT Stop Time  1102    PT Time Calculation (min)  41 min    Activity Tolerance  Patient limited by pain;Patient tolerated treatment well    Behavior During Therapy  [facility] for tasks assessed/performed       Past Medical History:  Diagnosis Date  . Abnormal heart rhythm    pt not sure what kind.  . Anemia   . Arthritis   . COPD (chronic obstructive pulmonary disease) (HCC)   . Dry skin   . GERD (gastroesophageal reflux disease)   . Hypertension   . Hypothyroidism   . Irritable bowel syndrome   . Kidney disease   . Pneumonia   . Shortness of breath   . Weight loss    10-12 lbs in 2 months (03/2011)    Past Surgical History:  Procedure Laterality Date  . ANTERIOR AND POSTERIOR VAGINAL REPAIR    . CARDIAC CATHETERIZATION     6 years ago  . COLONOSCOPY    . ESOPHAGOGASTRODUODENOSCOPY    . EYE SURGERY Bilateral 09/2013   cataract surgery with lens implant  . THYROIDECTOMY     Left  . TOE AMPUTATION Left    2nd toe  . TONSILLECTOMY    . TOTAL HIP ARTHROPLASTY  04/24/2011   Procedure: TOTAL HIP ARTHROPLASTY;  Surgeon: Nestor Lewandowsky, MD;  Location: [facility] OR;  Service: Orthopedics;  Laterality: Right;  DEPUY/PINNACLE CUP, SUMMIT BASIC STEM  . TOTAL HIP ARTHROPLASTY Left 04/20/2014   Procedure: TOTAL HIP ARTHROPLASTY;  Surgeon: Nestor Lewandowsky, MD;  Location: [facility] OR;  Service: Orthopedics;  Laterality: Left;    There were no vitals filed for this visit.  Subjective  Assessment - 09/10/17 1023    Subjective  Notes, "I can't do that exercise where I pull the dowel over my head, because it hurts too much".      Pertinent History  B THA, PVD, COPD, thyroid nodule, CAD, bronchiectasis     Diagnostic tests  per-patient had xray 08/20/17 but not sure of results     Patient Stated Goals  pressing garage button without pain    Currently in Pain?  No/denies    Pain Score  0-No pain Pain rising up to 8/10 with flexion wand exercise     Pain Location  Shoulder    Pain Orientation  Right;Anterior    Pain Descriptors / Indicators  Nagging    Aggravating Factors   HEP, reaching overhead     Multiple Pain Sites  No    Pain Score  -- up to 10/10 at worst          [facility] PT Assessment - 09/10/17 0001      Standardized Balance Assessment   Standardized Balance Assessment  Timed Up and Go Test      Timed Up and Go Test   TUG  Normal TUG  Normal TUG (seconds)  17.46                   [facility] Adult PT Treatment/Exercise - 09/10/17 1032      Shoulder Exercises: Supine   External Rotation  Right;AAROM;10 reps    External Rotation Limitations  wand     Flexion  Right;AAROM;5 reps poor tolerance due to R shoulder pain     Flexion Limitations  wand     ABduction  Right;AAROM;10 reps required guidance for proper motion     ABduction Limitations  wand       Shoulder Exercises: Seated   Flexion  Right;AAROM;10 reps    Flexion Limitations  with red p-ball roll-outs     Other Seated Exercises  Seated R flexion table slide x 10 reps    Other Seated Exercises  Seated R scaption table slide x 10 reps       Manual Therapy   Manual Therapy  Soft tissue mobilization;Myofascial release    Manual therapy comments  Supine     Soft tissue mobilization  STM to posterior/inferior shoulder, and anterior shoulder in area of tenderness    Myofascial Release  TPR to R posterior/inferior shoulder              PT Education - 09/10/17 1301    Education Details   HEP update     Person(s) Educated  Patient    Methods  Explanation;Demonstration;Verbal cues;Handout    Comprehension  Verbalized understanding;Returned demonstration;Verbal cues required;Need further instruction       PT Short Term Goals - 09/02/17 0932      PT SHORT TERM GOAL #1   Title  Patient to be independent with initial HEP.    Time  3    Period  Weeks    Status  On-going        PT Long Term Goals - 09/02/17 0932      PT LONG TERM GOAL #1   Title  Patient to be independent with advanced HEP.    Time  6    Period  Weeks    Status  On-going      PT LONG TERM GOAL #2   Title  Patient to demonstrate >=4/5 strength in R shoulder without pain limiting.    Time  6    Period  Weeks    Status  On-going      PT LONG TERM GOAL #3   Title  Patient to demonstrate R shoulder AROM/PROM WFL and symmetrical to opposite side.    Time  6    Period  Weeks    Status  On-going      PT LONG TERM GOAL #4   Title  Patient to report tolerance of R shoulder overhead reaching to press garage button at home.     Time  6    Period  Weeks    Status  On-going      PT LONG TERM GOAL #5   Title  Patient to demonstrate TUG <14 sec with LRAD without evidence of instability to decreased risk of falls.     Time  6    Period  Weeks    Status  On-going            Plan - 09/10/17 1213    Clinical Impression Statement  [name] seen to start session reporting continued poor tolerance for flexion wand HEP activity thus instructed pt. to stop performing this activity.  HEP updated with  ROM activities that pt. could perform with better tolerance in today's session.  [name] requiring increased time between activities in session due to slow movement pattern and increased time with positional changes.  Tolerated all gentle AAROM activities well today.  Did have continues tenderness in shoulder complex thus addressed this with STM/TPR in areas of tenderness with good response.  Ended session pain free  thus modalities deferred.     Clinical Impairments Affecting Rehab Potential  B THA, PVD, COPD, thyroid nodule, CAD, bronchiectasis     PT Treatment/Interventions  ADLs/Self Care Home Management;Cryotherapy;Electrical Stimulation;Moist Heat;Ultrasound;Gait training;Stair training;Functional mobility training;Therapeutic activities;Therapeutic exercise;Manual techniques;Patient/family education;Balance training;Neuromuscular re-education;Passive range of motion;Dry needling;Energy conservation;Splinting;Taping;Vasopneumatic Device    PT Next Visit Plan  Reassess updated HEP    Consulted and Agree with Plan of Care  Patient       Patient will benefit from skilled therapeutic intervention in order to improve the following deficits and impairments:  Pain, Impaired UE functional use, Decreased strength, Decreased activity tolerance, Decreased balance, Decreased mobility, Difficulty walking, Improper body mechanics, Decreased range of motion, Impaired flexibility, Postural dysfunction  Visit Diagnosis: Acute pain of right shoulder  Stiffness of right shoulder, not elsewhere classified  Muscle weakness (generalized)  Unsteadiness on feet  Other symptoms and signs involving the musculoskeletal system     Problem List Patient Active Problem List   Diagnosis Date Noted  . Anxiety and depression 07/17/2017  . Chest pain of uncertain etiology 01/17/2017  . Anemia 07/21/2016  . Arthritis, hip 04/20/2014  . Can't get food down 02/01/2014  . H/O cataract extraction 09/02/2013  . Age-related macular degeneration, dry 08/03/2013  . Glaucoma suspect 08/03/2013  . DDD (degenerative disc disease), lumbosacral 09/25/2012  . External hemorrhoid 09/25/2012  . Pain in joint, ankle and foot 08/10/2012  . Contracture of toe of left foot 08/10/2012  . Atherosclerosis of renal artery (HCC) 08/08/2012  . OP (osteoporosis) 08/08/2012  . Rhinitis, nonallergic, chronic 01/19/2012  . GERD (gastroesophageal  reflux disease) 01/19/2012  . Adult hypothyroidism 11/14/2011  . Atypical mycobacterial infection of lung (HCC) 07/03/2011  . Lung nodule 07/03/2011  . Osteoarthritis of right hip 04/26/2011  . Aseptic necrosis (HCC) 01/27/2011  . COPD (chronic obstructive pulmonary disease) with chronic bronchitis (HCC) 12/17/2010    Kermit Balo, PTA 09/10/17 1:01 PM   [facility] [address]  [address] [place], [place], [address] Phone: [phone]   Fax:  [phone]  Name: [name] MRN: [id number] Date of Birth: May 24, 1928

## 2017-09-12 ENCOUNTER — Ambulatory Visit: Payer: Medicare Other | Admitting: Physical Therapy

## 2017-09-15 ENCOUNTER — Ambulatory Visit: Payer: Medicare Other

## 2017-09-15 DIAGNOSIS — M25611 Stiffness of right shoulder, not elsewhere classified: Secondary | ICD-10-CM

## 2017-09-15 DIAGNOSIS — R2681 Unsteadiness on feet: Secondary | ICD-10-CM

## 2017-09-15 DIAGNOSIS — M25511 Pain in right shoulder: Secondary | ICD-10-CM

## 2017-09-15 DIAGNOSIS — M6281 Muscle weakness (generalized): Secondary | ICD-10-CM

## 2017-09-15 DIAGNOSIS — R29898 Other symptoms and signs involving the musculoskeletal system: Secondary | ICD-10-CM

## 2017-09-15 NOTE — Therapy (Signed)
[facility] [address]  [address] [place], [place], [address] Phone: [phone]   Fax:  [phone]  Physical Therapy Treatment  Patient Details  Name: [name] MRN: [id number] Date of Birth: 1929-02-24 Referring Provider: Lunette Stands, MD   Encounter Date: 09/15/2017  PT End of Session - 09/15/17 0905    Visit Number  4    Number of Visits  13    Date for PT Re-Evaluation  10/09/17    Authorization Type  Medicare & UMR    PT Start Time  0854    PT Stop Time  0932    PT Time Calculation (min)  38 min    Activity Tolerance  Patient tolerated treatment well    Behavior During Therapy  [facility] for tasks assessed/performed       Past Medical History:  Diagnosis Date  . Abnormal heart rhythm    pt not sure what kind.  . Anemia   . Arthritis   . COPD (chronic obstructive pulmonary disease) (HCC)   . Dry skin   . GERD (gastroesophageal reflux disease)   . Hypertension   . Hypothyroidism   . Irritable bowel syndrome   . Kidney disease   . Pneumonia   . Shortness of breath   . Weight loss    10-12 lbs in 2 months (03/2011)    Past Surgical History:  Procedure Laterality Date  . ANTERIOR AND POSTERIOR VAGINAL REPAIR    . CARDIAC CATHETERIZATION     6 years ago  . COLONOSCOPY    . ESOPHAGOGASTRODUODENOSCOPY    . EYE SURGERY Bilateral 09/2013   cataract surgery with lens implant  . THYROIDECTOMY     Left  . TOE AMPUTATION Left    2nd toe  . TONSILLECTOMY    . TOTAL HIP ARTHROPLASTY  04/24/2011   Procedure: TOTAL HIP ARTHROPLASTY;  Surgeon: Nestor Lewandowsky, MD;  Location: [facility] OR;  Service: Orthopedics;  Laterality: Right;  DEPUY/PINNACLE CUP, SUMMIT BASIC STEM  . TOTAL HIP ARTHROPLASTY Left 04/20/2014   Procedure: TOTAL HIP ARTHROPLASTY;  Surgeon: Nestor Lewandowsky, MD;  Location: [facility] OR;  Service: Orthopedics;  Laterality: Left;    There were no vitals filed for this visit.  Subjective Assessment - 09/15/17 0900    Subjective  Pt. noting she has some pain in R anterior shoulder while using shoulder which extends into R chest area.      Pertinent History  B THA, PVD, COPD, thyroid nodule, CAD, bronchiectasis     Diagnostic tests  per-patient had xray 08/20/17 but not sure of results     Patient Stated Goals  pressing garage button without pain    Currently in Pain?  Yes    Pain Score  1     Pain Location  Shoulder    Pain Orientation  Right;Anterior    Pain Descriptors / Indicators  Tightness    Pain Type  Acute pain    Pain Radiating Towards  extending into R superior chest     Aggravating Factors   exercises relieve     Multiple Pain Sites  No                       [facility] Adult PT Treatment/Exercise - 09/15/17 0908      Shoulder Exercises: Supine   External Rotation  Right;AAROM;15 reps    External Rotation Limitations  wand     Flexion  Right;AAROM;15  reps    Flexion Limitations  wand     ABduction  Right;AAROM;15 reps    ABduction Limitations  wand       Shoulder Exercises: Sidelying   External Rotation  10 reps;Right;AROM tolerated well    External Rotation Limitations  Cues for scapular squeeze     ABduction  10 reps;Right;AROM tolerated well    ABduction Limitations  0-90 dg      Shoulder Exercises: Pulleys   Flexion  2 minutes    Flexion Limitations  cueing for proper motion       Manual Therapy   Manual Therapy  Soft tissue mobilization;Myofascial release;Passive ROM    Manual therapy comments  Supine     Soft tissue mobilization  STM to R pec in area of tendernes; R anterior shoulder    Passive ROM  R shoulder PROM all directions to tolerance; manual R pec stretch                PT Short Term Goals - 09/02/17 0932      PT SHORT TERM GOAL #1   Title  Patient to be independent with initial HEP.    Time  3    Period  Weeks    Status  On-going        PT Long Term Goals - 09/02/17 0932      PT LONG TERM GOAL #1   Title  Patient to be  independent with advanced HEP.    Time  6    Period  Weeks    Status  On-going      PT LONG TERM GOAL #2   Title  Patient to demonstrate >=4/5 strength in R shoulder without pain limiting.    Time  6    Period  Weeks    Status  On-going      PT LONG TERM GOAL #3   Title  Patient to demonstrate R shoulder AROM/PROM WFL and symmetrical to opposite side.    Time  6    Period  Weeks    Status  On-going      PT LONG TERM GOAL #4   Title  Patient to report tolerance of R shoulder overhead reaching to press garage button at home.     Time  6    Period  Weeks    Status  On-going      PT LONG TERM GOAL #5   Title  Patient to demonstrate TUG <14 sec with LRAD without evidence of instability to decreased risk of falls.     Time  6    Period  Weeks    Status  On-going            Plan - 09/15/17 0907    Clinical Impression Statement  Pt. noting improved tolerance for overhead reaching to garage door button.  Able to demo much improved tolerance for AAROM activities in session today and instructed to return to performing HEP wand flexion activities at home.  Pt. still required increased time for change in positioning and with slow movement pattern requiring increased time for performance of activities in session.  Does note she feels like she is improving with therapy.  Will continue to progress.      Clinical Impairments Affecting Rehab Potential  B THA, PVD, COPD, thyroid nodule, CAD, bronchiectasis     PT Treatment/Interventions  ADLs/Self Care Home Management;Cryotherapy;Electrical Stimulation;Moist Heat;Ultrasound;Gait training;Stair training;Functional mobility training;Therapeutic activities;Therapeutic exercise;Manual techniques;Patient/family education;Balance training;Neuromuscular re-education;Passive range of motion;Dry needling;Energy  conservation;Splinting;Taping;Vasopneumatic Device    Consulted and Agree with Plan of Care  Patient       Patient will benefit from skilled  therapeutic intervention in order to improve the following deficits and impairments:  Pain, Impaired UE functional use, Decreased strength, Decreased activity tolerance, Decreased balance, Decreased mobility, Difficulty walking, Improper body mechanics, Decreased range of motion, Impaired flexibility, Postural dysfunction  Visit Diagnosis: Acute pain of right shoulder  Stiffness of right shoulder, not elsewhere classified  Muscle weakness (generalized)  Unsteadiness on feet  Other symptoms and signs involving the musculoskeletal system     Problem List Patient Active Problem List   Diagnosis Date Noted  . Anxiety and depression 07/17/2017  . Chest pain of uncertain etiology 01/17/2017  . Anemia 07/21/2016  . Arthritis, hip 04/20/2014  . Can't get food down 02/01/2014  . H/O cataract extraction 09/02/2013  . Age-related macular degeneration, dry 08/03/2013  . Glaucoma suspect 08/03/2013  . DDD (degenerative disc disease), lumbosacral 09/25/2012  . External hemorrhoid 09/25/2012  . Pain in joint, ankle and foot 08/10/2012  . Contracture of toe of left foot 08/10/2012  . Atherosclerosis of renal artery (HCC) 08/08/2012  . OP (osteoporosis) 08/08/2012  . Rhinitis, nonallergic, chronic 01/19/2012  . GERD (gastroesophageal reflux disease) 01/19/2012  . Adult hypothyroidism 11/14/2011  . Atypical mycobacterial infection of lung (HCC) 07/03/2011  . Lung nodule 07/03/2011  . Osteoarthritis of right hip 04/26/2011  . Aseptic necrosis (HCC) 01/27/2011  . COPD (chronic obstructive pulmonary disease) with chronic bronchitis (HCC) 12/17/2010    Kermit BaloMicah Darnella Zeiter, PTA 09/15/17 12:30 PM   [facility] [address]  [address] [place], [place], [address] Phone: [phone]   Fax:  [phone]  Name: Orvan JulySarah A Valko MRN: [id number] Date of Birth: 14-Mar-1928

## 2017-09-18 ENCOUNTER — Encounter: Payer: Self-pay | Admitting: Physical Therapy

## 2017-09-18 ENCOUNTER — Ambulatory Visit: Payer: Medicare Other | Admitting: Physical Therapy

## 2017-09-18 DIAGNOSIS — R2681 Unsteadiness on feet: Secondary | ICD-10-CM

## 2017-09-18 DIAGNOSIS — R29898 Other symptoms and signs involving the musculoskeletal system: Secondary | ICD-10-CM

## 2017-09-18 DIAGNOSIS — M25611 Stiffness of right shoulder, not elsewhere classified: Secondary | ICD-10-CM

## 2017-09-18 DIAGNOSIS — M6281 Muscle weakness (generalized): Secondary | ICD-10-CM

## 2017-09-18 DIAGNOSIS — M25511 Pain in right shoulder: Secondary | ICD-10-CM | POA: Diagnosis not present

## 2017-09-18 NOTE — Therapy (Signed)
[facility] [address]  [address] [place], [place], [address] Phone: [phone]   Fax:  [phone]  Physical Therapy Treatment  Patient Details  Name: [name] MRN: [id number] Date of Birth: 10-Feb-1929 Referring Provider: Lunette Stands, MD   Encounter Date: 09/18/2017  PT End of Session - 09/18/17 0933    Visit Number  5    Number of Visits  13    Date for PT Re-Evaluation  10/09/17    Authorization Type  Medicare & UMR    PT Start Time  0837    PT Stop Time  0926    PT Time Calculation (min)  49 min    Activity Tolerance  Patient tolerated treatment well    Behavior During Therapy  [facility] for tasks assessed/performed       Past Medical History:  Diagnosis Date  . Abnormal heart rhythm    pt not sure what kind.  . Anemia   . Arthritis   . COPD (chronic obstructive pulmonary disease) (HCC)   . Dry skin   . GERD (gastroesophageal reflux disease)   . Hypertension   . Hypothyroidism   . Irritable bowel syndrome   . Kidney disease   . Pneumonia   . Shortness of breath   . Weight loss    10-12 lbs in 2 months (03/2011)    Past Surgical History:  Procedure Laterality Date  . ANTERIOR AND POSTERIOR VAGINAL REPAIR    . CARDIAC CATHETERIZATION     6 years ago  . COLONOSCOPY    . ESOPHAGOGASTRODUODENOSCOPY    . EYE SURGERY Bilateral 09/2013   cataract surgery with lens implant  . THYROIDECTOMY     Left  . TOE AMPUTATION Left    2nd toe  . TONSILLECTOMY    . TOTAL HIP ARTHROPLASTY  04/24/2011   Procedure: TOTAL HIP ARTHROPLASTY;  Surgeon: Nestor Lewandowsky, MD;  Location: [facility] OR;  Service: Orthopedics;  Laterality: Right;  DEPUY/PINNACLE CUP, SUMMIT BASIC STEM  . TOTAL HIP ARTHROPLASTY Left 04/20/2014   Procedure: TOTAL HIP ARTHROPLASTY;  Surgeon: Nestor Lewandowsky, MD;  Location: [facility] OR;  Service: Orthopedics;  Laterality: Left;    There were no vitals filed for this visit.  Subjective Assessment - 09/18/17 0839    Subjective  Reports she had to miss one session because her dog died. Reports she had to press garage button today with improvement in pain levels.    Pertinent History  B THA, PVD, COPD, thyroid nodule, CAD, bronchiectasis     Diagnostic tests  per-patient had xray 08/20/17 but not sure of results     Patient Stated Goals  pressing garage button without pain    Currently in Pain?  Yes    Pain Score  3     Pain Location  Shoulder    Pain Orientation  Right;Anterior    Pain Descriptors / Indicators  Tightness    Pain Type  Acute pain                       [facility] Adult PT Treatment/Exercise - 09/18/17 0001      Exercises   Exercises  Shoulder      Shoulder Exercises: Supine   External Rotation  AAROM;Both;10 reps    External Rotation Limitations  wand; TCs to keep elbows by side    Flexion  Both;10 reps;AAROM;Limitations    Flexion Limitations  wand; cues to slow down  ABduction  Right;AAROM;10 reps;Limitations    ABduction Limitations  wand; TC/VCs for form      Shoulder Exercises: Seated   Retraction  Both;10 reps    Retraction Limitations  10x3" hold    Row  Both;10 reps;Theraband    Theraband Level (Shoulder Row)  Level 2 (Red)    Row Limitations  cues for full scap. retraction     External Rotation  --    Flexion  Both;10 reps;AAROM    Flexion Limitations  rolling forward into flexion with orange pball; pt with good tolerance "it feelts good"    Abduction  AAROM;Right;10 reps;Limitations    ABduction Limitations  rolling orang pball into abduction to tolerance; advised to decrease range for better tolerance      Shoulder Exercises: Sidelying   External Rotation  10 reps;Right;AROM    External Rotation Limitations  Cues for scapular squeeze       Shoulder Exercises: Standing   External Rotation  Strengthening;Right;10 reps;Theraband;Limitations    Theraband Level (Shoulder External Rotation)  Level 1 (Yellow)    External Rotation Weight (lbs)  ER step  outs with dowel under elbow; 10x with CGA for balance    Internal Rotation  Strengthening;Right;10 reps;Theraband;Limitations    Theraband Level (Shoulder Internal Rotation)  Level 1 (Yellow)    Internal Rotation Weight (lbs)  IR step outs with dowel under elbow; CGA for balance      Shoulder Exercises: Pulleys   Flexion  3 minutes    Scaption  3 minutes      Manual Therapy   Manual Therapy  Soft tissue mobilization;Joint mobilization    Manual therapy comments  Supine     Joint Mobilization  R shoulder posterior grade II mobs; to tolerance    Soft tissue mobilization  R pec, deltoid, infraspinatus; tenderness in supraspinatus insertion; soft tissue restriction in pec               PT Short Term Goals - 09/02/17 0932      PT SHORT TERM GOAL #1   Title  Patient to be independent with initial HEP.    Time  3    Period  Weeks    Status  On-going        PT Long Term Goals - 09/02/17 0932      PT LONG TERM GOAL #1   Title  Patient to be independent with advanced HEP.    Time  6    Period  Weeks    Status  On-going      PT LONG TERM GOAL #2   Title  Patient to demonstrate >=4/5 strength in R shoulder without pain limiting.    Time  6    Period  Weeks    Status  On-going      PT LONG TERM GOAL #3   Title  Patient to demonstrate R shoulder AROM/PROM WFL and symmetrical to opposite side.    Time  6    Period  Weeks    Status  On-going      PT LONG TERM GOAL #4   Title  Patient to report tolerance of R shoulder overhead reaching to press garage button at home.     Time  6    Period  Weeks    Status  On-going      PT LONG TERM GOAL #5   Title  Patient to demonstrate TUG <14 sec with LRAD without evidence of instability to decreased risk of falls.  Time  6    Period  Weeks    Status  On-going            Plan - 09/18/17 0933    Clinical Impression Statement  [facility] and no new complaints. Reports improved tolerance of  overhead reaching with R shoulder to press garage door button. Tolerated STM to R shoulder- patient with mild soft tissue restriction in R pec and tenderness in supraspinatus insertion. Also tolerated gentle grade II posterior shoulder mobs. Patient with good tolerance of sidelying ER, plan to attempt with hand weight next visit. Patient with good tolerance and relief with AAROM flexion rolling ball forward. Addition of isometric R shoulder ER/IR step outs with yellow TB and CGA for balance. Patient requiring frequent tactile cues to promote upright posture as she is severely kyphotic at baseline. Advised patient to continue using her [facility] for ambulation as she reports she feels unsteady on her feet. Patient reported understanding. Patient without c/o pain at end of session.     PT Treatment/Interventions  ADLs/Self Care Home Management;Cryotherapy;Electrical Stimulation;Moist Heat;Ultrasound;Gait training;Stair training;Functional mobility training;Therapeutic activities;Therapeutic exercise;Manual techniques;Patient/family education;Balance training;Neuromuscular re-education;Passive range of motion;Dry needling;Energy conservation;Splinting;Taping;Vasopneumatic Device    PT Next Visit Plan  attempt sidelying ER with 1#    Consulted and Agree with Plan of Care  Patient       Patient will benefit from skilled therapeutic intervention in order to improve the following deficits and impairments:  Pain, Impaired UE functional use, Decreased strength, Decreased activity tolerance, Decreased balance, Decreased mobility, Difficulty walking, Improper body mechanics, Decreased range of motion, Impaired flexibility, Postural dysfunction  Visit Diagnosis: Acute pain of right shoulder  Stiffness of right shoulder, not elsewhere classified  Muscle weakness (generalized)  Unsteadiness on feet  Other symptoms and signs involving the musculoskeletal system     Problem List Patient Active Problem List    Diagnosis Date Noted  . Anxiety and depression 07/17/2017  . Chest pain of uncertain etiology 01/17/2017  . Anemia 07/21/2016  . Arthritis, hip 04/20/2014  . Can't get food down 02/01/2014  . H/O cataract extraction 09/02/2013  . Age-related macular degeneration, dry 08/03/2013  . Glaucoma suspect 08/03/2013  . DDD (degenerative disc disease), lumbosacral 09/25/2012  . External hemorrhoid 09/25/2012  . Pain in joint, ankle and foot 08/10/2012  . Contracture of toe of left foot 08/10/2012  . Atherosclerosis of renal artery (HCC) 08/08/2012  . OP (osteoporosis) 08/08/2012  . Rhinitis, nonallergic, chronic 01/19/2012  . GERD (gastroesophageal reflux disease) 01/19/2012  . Adult hypothyroidism 11/14/2011  . Atypical mycobacterial infection of lung (HCC) 07/03/2011  . Lung nodule 07/03/2011  . Osteoarthritis of right hip 04/26/2011  . Aseptic necrosis (HCC) 01/27/2011  . COPD (chronic obstructive pulmonary disease) with chronic bronchitis (HCC) 12/17/2010    Anette GuarneriYevgeniya Roczen Waymire, PT, DPT 09/18/17 9:49 AM   [facility] [address]  [address] [place], [place], [address] Phone: [phone]   Fax:  [phone]  Name: [name] MRN: [id number] Date of Birth: 22-Mar-1928

## 2017-09-22 ENCOUNTER — Ambulatory Visit: Payer: Medicare Other

## 2017-09-22 DIAGNOSIS — R29898 Other symptoms and signs involving the musculoskeletal system: Secondary | ICD-10-CM

## 2017-09-22 DIAGNOSIS — M25511 Pain in right shoulder: Secondary | ICD-10-CM

## 2017-09-22 DIAGNOSIS — M25611 Stiffness of right shoulder, not elsewhere classified: Secondary | ICD-10-CM

## 2017-09-22 DIAGNOSIS — R2681 Unsteadiness on feet: Secondary | ICD-10-CM

## 2017-09-22 DIAGNOSIS — M6281 Muscle weakness (generalized): Secondary | ICD-10-CM

## 2017-09-22 NOTE — Therapy (Signed)
[facility] [address]  [address] [place], [place], [address] Phone: [phone]   Fax:  [phone]  Physical Therapy Treatment  Patient Details  Name: [name] MRN: [id number] Date of Birth: 1928/03/18 Referring Provider: Lunette Stands, MD   Encounter Date: 09/22/2017  PT End of Session - 09/22/17 0950    Visit Number  6    Number of Visits  13    Date for PT Re-Evaluation  10/09/17    Authorization Type  Medicare & UMR    PT Start Time  [phone] pt. arrived late     PT Stop Time  1015    PT Time Calculation (min)  33 min    Activity Tolerance  Patient tolerated treatment well    Behavior During Therapy  [facility] for tasks assessed/performed       Past Medical History:  Diagnosis Date  . Abnormal heart rhythm    pt not sure what kind.  . Anemia   . Arthritis   . COPD (chronic obstructive pulmonary disease) (HCC)   . Dry skin   . GERD (gastroesophageal reflux disease)   . Hypertension   . Hypothyroidism   . Irritable bowel syndrome   . Kidney disease   . Pneumonia   . Shortness of breath   . Weight loss    10-12 lbs in 2 months (03/2011)    Past Surgical History:  Procedure Laterality Date  . ANTERIOR AND POSTERIOR VAGINAL REPAIR    . CARDIAC CATHETERIZATION     6 years ago  . COLONOSCOPY    . ESOPHAGOGASTRODUODENOSCOPY    . EYE SURGERY Bilateral 09/2013   cataract surgery with lens implant  . THYROIDECTOMY     Left  . TOE AMPUTATION Left    2nd toe  . TONSILLECTOMY    . TOTAL HIP ARTHROPLASTY  04/24/2011   Procedure: TOTAL HIP ARTHROPLASTY;  Surgeon: Nestor Lewandowsky, MD;  Location: [facility] OR;  Service: Orthopedics;  Laterality: Right;  DEPUY/PINNACLE CUP, SUMMIT BASIC STEM  . TOTAL HIP ARTHROPLASTY Left 04/20/2014   Procedure: TOTAL HIP ARTHROPLASTY;  Surgeon: Nestor Lewandowsky, MD;  Location: [facility] OR;  Service: Orthopedics;  Laterality: Left;    There were no vitals filed for this visit.  Subjective Assessment -  09/22/17 0946    Subjective  Has had difficulty sleeping after difficult phone call with TV provider yesterday.      Pertinent History  B THA, PVD, COPD, thyroid nodule, CAD, bronchiectasis     Diagnostic tests  per-patient had xray 08/20/17 but not sure of results     Patient Stated Goals  pressing garage button without pain    Currently in Pain?  Yes    Pain Score  2  Rises to 6/10 at worst     Pain Location  Shoulder    Pain Orientation  Right;Anterior    Pain Descriptors / Indicators  Tightness    Pain Type  Acute pain    Pain Onset  More than a month ago    Pain Frequency  Intermittent    Multiple Pain Sites  No                       [facility] Adult PT Treatment/Exercise - 09/22/17 0958      Shoulder Exercises: Supine   Flexion  Right;10 reps;Strengthening;AROM      Shoulder Exercises: Standing   External Rotation  Strengthening;Right;Theraband;Limitations;12 reps  Theraband Level (Shoulder External Rotation)  Level 1 (Yellow)    External Rotation Weight (lbs)  --    Internal Rotation  Strengthening;Right;Theraband;Limitations;12 reps    Theraband Level (Shoulder Internal Rotation)  Level 1 (Yellow)    Row  15 reps;Strengthening;Theraband    Theraband Level (Shoulder Row)  Level 1 (Yellow)    Row Limitations  cues for scapular retraction       Shoulder Exercises: Pulleys   Flexion  3 minutes    Scaption  3 minutes      Manual Therapy   Manual Therapy  Joint mobilization;Passive ROM    Manual therapy comments  Supine     Passive ROM  R shoulder PROM all directions to tolerance                PT Short Term Goals - 09/02/17 0932      PT SHORT TERM GOAL #1   Title  Patient to be independent with initial HEP.    Time  3    Period  Weeks    Status  On-going        PT Long Term Goals - 09/02/17 0932      PT LONG TERM GOAL #1   Title  Patient to be independent with advanced HEP.    Time  6    Period  Weeks    Status  On-going      PT  LONG TERM GOAL #2   Title  Patient to demonstrate >=4/5 strength in R shoulder without pain limiting.    Time  6    Period  Weeks    Status  On-going      PT LONG TERM GOAL #3   Title  Patient to demonstrate R shoulder AROM/PROM WFL and symmetrical to opposite side.    Time  6    Period  Weeks    Status  On-going      PT LONG TERM GOAL #4   Title  Patient to report tolerance of R shoulder overhead reaching to press garage button at home.     Time  6    Period  Weeks    Status  On-going      PT LONG TERM GOAL #5   Title  Patient to demonstrate TUG <14 sec with LRAD without evidence of instability to decreased risk of falls.     Time  6    Period  Weeks    Status  On-going            Plan - 09/22/17 0950    Clinical Impression Statement  Pt. arriving to session late thus treatment time limited.  [name] reporting she has been very frustrated today and had increased R shoulder pain after getting poor sleep following difficult phone call with TV provider yesterday.  Noted improvement in R shoulder pain following ROM and gentle RTC strengthening in today's session.  Does still present with R anterior shoulder tenderness and still having "catching" pain with gravity minimized flexion activities.  Does note that she leaves therapy feeling looser at R shoulder.  Will continue to progress toward goals.      Clinical Impairments Affecting Rehab Potential  B THA, PVD, COPD, thyroid nodule, CAD, bronchiectasis     PT Treatment/Interventions  ADLs/Self Care Home Management;Cryotherapy;Electrical Stimulation;Moist Heat;Ultrasound;Gait training;Stair training;Functional mobility training;Therapeutic activities;Therapeutic exercise;Manual techniques;Patient/family education;Balance training;Neuromuscular re-education;Passive range of motion;Dry needling;Energy conservation;Splinting;Taping;Vasopneumatic Device    Consulted and Agree with Plan of Care  Patient  Patient will benefit from  skilled therapeutic intervention in order to improve the following deficits and impairments:  Pain, Impaired UE functional use, Decreased strength, Decreased activity tolerance, Decreased balance, Decreased mobility, Difficulty walking, Improper body mechanics, Decreased range of motion, Impaired flexibility, Postural dysfunction  Visit Diagnosis: Acute pain of right shoulder  Stiffness of right shoulder, not elsewhere classified  Muscle weakness (generalized)  Unsteadiness on feet  Other symptoms and signs involving the musculoskeletal system     Problem List Patient Active Problem List   Diagnosis Date Noted  . Anxiety and depression 07/17/2017  . Chest pain of uncertain etiology 01/17/2017  . Anemia 07/21/2016  . Arthritis, hip 04/20/2014  . Can't get food down 02/01/2014  . H/O cataract extraction 09/02/2013  . Age-related macular degeneration, dry 08/03/2013  . Glaucoma suspect 08/03/2013  . DDD (degenerative disc disease), lumbosacral 09/25/2012  . External hemorrhoid 09/25/2012  . Pain in joint, ankle and foot 08/10/2012  . Contracture of toe of left foot 08/10/2012  . Atherosclerosis of renal artery (HCC) 08/08/2012  . OP (osteoporosis) 08/08/2012  . Rhinitis, nonallergic, chronic 01/19/2012  . GERD (gastroesophageal reflux disease) 01/19/2012  . Adult hypothyroidism 11/14/2011  . Atypical mycobacterial infection of lung (HCC) 07/03/2011  . Lung nodule 07/03/2011  . Osteoarthritis of right hip 04/26/2011  . Aseptic necrosis (HCC) 01/27/2011  . COPD (chronic obstructive pulmonary disease) with chronic bronchitis (HCC) 12/17/2010    Kermit Balo, PTA 09/22/17 12:16 PM   [facility] [address]  [address] [place], [place], [address] Phone: [phone]   Fax:  [phone]  Name: [name] MRN: [id number] Date of Birth: 06-11-1928

## 2017-09-25 ENCOUNTER — Ambulatory Visit: Payer: Medicare Other | Admitting: Physical Therapy

## 2017-09-25 ENCOUNTER — Encounter: Payer: Self-pay | Admitting: Physical Therapy

## 2017-09-25 DIAGNOSIS — M25511 Pain in right shoulder: Secondary | ICD-10-CM | POA: Diagnosis not present

## 2017-09-25 DIAGNOSIS — M6281 Muscle weakness (generalized): Secondary | ICD-10-CM

## 2017-09-25 DIAGNOSIS — R29898 Other symptoms and signs involving the musculoskeletal system: Secondary | ICD-10-CM

## 2017-09-25 DIAGNOSIS — R2681 Unsteadiness on feet: Secondary | ICD-10-CM

## 2017-09-25 DIAGNOSIS — M25611 Stiffness of right shoulder, not elsewhere classified: Secondary | ICD-10-CM

## 2017-09-25 NOTE — Therapy (Signed)
[facility] [address]  [place], [place], [address] Phone: [phone]   Fax:  [phone]  Physical Therapy Treatment  Patient Details  Name: [name] MRN: [id number] Date of Birth: [birth year] Referring Provider: Almedia Balls, MD   Encounter Date: 09/25/2017  PT End of Session - 09/25/17 1027    Visit Number  7    Number of Visits  13    Date for PT Re-Evaluation  10/09/17    Authorization Type  Medicare & UMR    PT Start Time  0925    PT Stop Time  1019    PT Time Calculation (min)  54 min    Activity Tolerance  Patient tolerated treatment well    Behavior During Therapy  [facility] for tasks assessed/performed       Past Medical History:  Diagnosis Date  . Abnormal heart rhythm    pt not sure what kind.  . Anemia   . Arthritis   . COPD (chronic obstructive pulmonary disease) ([place])   . Dry skin   . GERD (gastroesophageal reflux disease)   . Hypertension   . Hypothyroidism   . Irritable bowel syndrome   . Kidney disease   . Pneumonia   . Shortness of breath   . Weight loss    10-12 lbs in 2 months (03/2011)    Past Surgical History:  Procedure Laterality Date  . ANTERIOR AND POSTERIOR VAGINAL REPAIR    . CARDIAC CATHETERIZATION     6 years ago  . COLONOSCOPY    . ESOPHAGOGASTRODUODENOSCOPY    . EYE SURGERY Bilateral 09/2013   cataract surgery with lens implant  . THYROIDECTOMY     Left  . TOE AMPUTATION Left    2nd toe  . TONSILLECTOMY    . TOTAL HIP ARTHROPLASTY  04/24/2011   Procedure: TOTAL HIP ARTHROPLASTY;  Surgeon: Kerin Salen, MD;  Location: [place];  Service: Orthopedics;  Laterality: Right;  DEPUY/PINNACLE CUP, SUMMIT BASIC STEM  . TOTAL HIP ARTHROPLASTY Left 04/20/2014   Procedure: TOTAL HIP ARTHROPLASTY;  Surgeon: Kerin Salen, MD;  Location: [place];  Service: Orthopedics;  Laterality: Left;    There were no vitals filed for this visit.  Subjective Assessment - 09/25/17 0926     Subjective  Patient reports that she [place]. But using her other one today. Reports R shoulder is hurting more today than before, but strength is better now. Night pain is worse and radiates down to R hand. Able to reach sun visor on passenger's side now. Reports 50% improvement since inital eval.    Pertinent History  B THA, PVD, COPD, thyroid nodule, CAD, bronchiectasis     Diagnostic tests  per-patient had xray 08/20/17 but not sure of results     Patient Stated Goals  pressing garage button without pain    Currently in Pain?  Yes    Pain Score  3     Pain Location  Shoulder    Pain Orientation  Right    Pain Descriptors / Indicators  Tightness    Pain Type  Acute pain         [facility] PT Assessment - 09/25/17 0001      AROM   Right/Left Shoulder  Right    Right Shoulder Flexion  135 Degrees    Right Shoulder ABduction  150 Degrees    Right Shoulder Internal Rotation  27 Degrees  Right Shoulder External Rotation  78 Degrees      PROM   Right/Left Shoulder  Right    Right Shoulder Flexion  173 Degrees    Right Shoulder ABduction  165 Degrees    Right Shoulder Internal Rotation  60 Degrees    Right Shoulder External Rotation  84 Degrees report of "popping" but no pain      Strength   Right/Left Shoulder  Right    Right Shoulder Flexion  4/5    Right Shoulder ABduction  3+/5    Right Shoulder Internal Rotation  3+/5 pain    Right Shoulder External Rotation  3+/5 pain      Timed Up and Go Test   TUG  Normal TUG    Normal TUG (seconds)  17.3                   [facility] Adult PT Treatment/Exercise - 09/25/17 0001      Shoulder Exercises: Supine   Flexion  AROM;Right;10 reps;Limitations    Flexion Limitations  supine scaption with thumb up      Shoulder Exercises: Sidelying   External Rotation  10 reps;Right;AROM    External Rotation Limitations  Cues for scapular squeeze and dowel under elbow    ABduction  10 reps;Right;AROM     ABduction Limitations  to tolerance; thumb up      Shoulder Exercises: Standing   Horizontal ABduction  Strengthening;Both;10 reps;Theraband;Limitations    Theraband Level (Shoulder Horizontal ABduction)  Level 1 (Yellow)    Horizontal ABduction Limitations  VCs to avoid valsalva; standing with back flat against wall    External Rotation  Strengthening;Right;Theraband;Limitations;12 reps    Theraband Level (Shoulder External Rotation)  Level 1 (Yellow)    External Rotation Weight (lbs)  dowel under elbow    Internal Rotation  Strengthening;Right;Theraband;Limitations;12 reps    Theraband Level (Shoulder Internal Rotation)  Level 1 (Yellow)    Internal Rotation Weight (lbs)  dowel under elbow    Row  15 reps;Strengthening;Theraband    Theraband Level (Shoulder Row)  Level 2 (Red)    Row Limitations  cues for scapular retraction     Other Standing Exercises  doorway 90/90 R pec stretch 2x 20"    Other Standing Exercises  corner pec stretch B UE 20 sec      Shoulder Exercises: Pulleys   Flexion  3 minutes    Scaption  3 minutes      Manual Therapy   Manual Therapy  Soft tissue mobilization;Passive ROM    Manual therapy comments  Supine     Soft tissue mobilization  R biceps and anterior delt- mildly TTP in anterior shoulder    Passive ROM  R shoulder PROM all directions to tolerance              PT Education - 09/25/17 1026    Education Details  addition to HEP- advised to perform with stable surface nearby to avoid risk of falls and avoid pushing into pain as this is patient's tendency    Person(s) Educated  Patient    Methods  Explanation;Demonstration;Tactile cues;Verbal cues;Handout    Comprehension  Verbalized understanding;Returned demonstration       PT Short Term Goals - 09/25/17 0935      PT SHORT TERM GOAL #1   Title  Patient to be independent with initial HEP.    Time  3    Period  Weeks    Status  Achieved  PT Long Term Goals - 09/25/17 0935       PT LONG TERM GOAL #1   Title  Patient to be independent with advanced HEP.    Time  6    Period  Weeks    Status  On-going reports inconsistency for the past 10 days d/t dog dying      PT LONG TERM GOAL #2   Title  Patient to demonstrate >=4/5 strength in R shoulder without pain limiting.    Time  6    Period  Weeks    Status  Partially Met improvements demonstrated in R shoulder ABD      PT LONG TERM GOAL #3   Title  Patient to demonstrate R shoulder AROM/PROM WFL and symmetrical to opposite side.    Time  6    Period  Weeks    Status  On-going improvements demonstrated in R shoulder ABD and ER AROM, flexion, ABD, IR PROM      PT LONG TERM GOAL #4   Title  Patient to report tolerance of R shoulder overhead reaching to press garage button at home.     Time  6    Period  Weeks    Status  Achieved reports able to perform this at this time, much improved      PT LONG TERM GOAL #5   Title  Patient to demonstrate TUG <14 sec with LRAD without evidence of instability to decreased risk of falls.     Time  6    Period  Weeks    Status  On-going scored 17.3 sec without AD            Plan - 09/25/17 1027    Clinical Impression Statement  Patient arrived to session with no new complaints. Reports 50% overall improvement in R shoulder since start of POC. Reports improvement in functional overhead reaching tasks, however notes increase in night pain with pain radiating to R hand. Updated goals this date- patient demonstrated improvements in R shoulder abduction and ER AROM, however marked decrease in ER this date. Improvements also in flexion, abduction, ER PROM and abduction strength. Patient has met functional overhead reaching goal at this time, however TUG testing nearly unchanged at this time. Plan to focus on balance training in coming visits to address fall risk. Patient with good tolerance of supine and sidelying AROM; no pain noted. Focused on correction of kyphotic and rounded  shoulders posturing this session with patient reporting decrease in LBP after session. Added standing corner pec stretch to HEP and advised patient to perform with sturdy object nearby to avoid falls. Patient reported understanding.     Clinical Impairments Affecting Rehab Potential  B THA, PVD, COPD, thyroid nodule, CAD, bronchiectasis     PT Treatment/Interventions  ADLs/Self Care Home Management;Cryotherapy;Electrical Stimulation;Moist Heat;Ultrasound;Gait training;Stair training;Functional mobility training;Therapeutic activities;Therapeutic exercise;Manual techniques;Patient/family education;Balance training;Neuromuscular re-education;Passive range of motion;Dry needling;Energy conservation;Splinting;Taping;Vasopneumatic Device    PT Next Visit Plan  balance training- speed and turning    Consulted and Agree with Plan of Care  Patient       Patient will benefit from skilled therapeutic intervention in order to improve the following deficits and impairments:  Pain, Impaired UE functional use, Decreased strength, Decreased activity tolerance, Decreased balance, Decreased mobility, Difficulty walking, Improper body mechanics, Decreased range of motion, Impaired flexibility, Postural dysfunction  Visit Diagnosis: Acute pain of right shoulder  Stiffness of right shoulder, not elsewhere classified  Muscle weakness (generalized)  Unsteadiness on feet  Other symptoms and signs involving the musculoskeletal system     Problem List Patient Active Problem List   Diagnosis Date Noted  . Anxiety and depression 07/17/2017  . Chest pain of uncertain etiology 50/72/2575  . Anemia 07/21/2016  . Arthritis, hip 04/20/2014  . Can't get food down 02/01/2014  . H/O cataract extraction 09/02/2013  . Age-related macular degeneration, dry 08/03/2013  . Glaucoma suspect 08/03/2013  . DDD (degenerative disc disease), lumbosacral 09/25/2012  . External hemorrhoid 09/25/2012  . Pain in joint, ankle and  foot 08/10/2012  . Contracture of toe of left foot 08/10/2012  . Atherosclerosis of renal artery ([place]) 08/08/2012  . OP (osteoporosis) 08/08/2012  . Rhinitis, nonallergic, chronic 01/19/2012  . GERD (gastroesophageal reflux disease) 01/19/2012  . Adult hypothyroidism 11/14/2011  . Atypical mycobacterial infection of lung ([place]) 07/03/2011  . Lung nodule 07/03/2011  . Osteoarthritis of right hip 04/26/2011  . Aseptic necrosis ([place]) 01/27/2011  . COPD (chronic obstructive pulmonary disease) with chronic bronchitis ([place]) 12/17/2010     Janene Harvey, PT, DPT 09/25/17 10:35 AM   [facility] [address]  [place], [place], [address] Phone: [phone]   Fax:  [phone]  Name: SHELMA EIBEN MRN: [id number] Date of Birth: December 01, 1928

## 2017-09-29 ENCOUNTER — Ambulatory Visit: Payer: Medicare Other | Admitting: Physical Therapy

## 2017-10-02 ENCOUNTER — Ambulatory Visit: Payer: Medicare Other | Attending: Orthopedic Surgery | Admitting: Physical Therapy

## 2017-10-02 ENCOUNTER — Encounter: Payer: Self-pay | Admitting: Physical Therapy

## 2017-10-02 DIAGNOSIS — R2681 Unsteadiness on feet: Secondary | ICD-10-CM | POA: Insufficient documentation

## 2017-10-02 DIAGNOSIS — M25611 Stiffness of right shoulder, not elsewhere classified: Secondary | ICD-10-CM | POA: Insufficient documentation

## 2017-10-02 DIAGNOSIS — R29898 Other symptoms and signs involving the musculoskeletal system: Secondary | ICD-10-CM | POA: Diagnosis present

## 2017-10-02 DIAGNOSIS — M25511 Pain in right shoulder: Secondary | ICD-10-CM | POA: Diagnosis present

## 2017-10-02 DIAGNOSIS — M6281 Muscle weakness (generalized): Secondary | ICD-10-CM | POA: Diagnosis present

## 2017-10-02 NOTE — Therapy (Signed)
[facility] [address]  [place], [place], [address] Phone: [phone]   Fax:  [phone]  Physical Therapy Treatment  Patient Details  Name: [name] MRN: [id number] Date of Birth: 10/12/1928 Referring Provider: Almedia Balls, MD   Encounter Date: 10/02/2017  PT End of Session - 10/02/17 1041    Visit Number  8    Number of Visits  13    Date for PT Re-Evaluation  10/09/17    Authorization Type  Medicare & UMR    PT Start Time  0932    PT Stop Time  1016    PT Time Calculation (min)  44 min    Equipment Utilized During Treatment  Gait belt    Activity Tolerance  Patient tolerated treatment well    Behavior During Therapy  [facility] for tasks assessed/performed       Past Medical History:  Diagnosis Date  . Abnormal heart rhythm    pt not sure what kind.  . Anemia   . Arthritis   . COPD (chronic obstructive pulmonary disease) ([place])   . Dry skin   . GERD (gastroesophageal reflux disease)   . Hypertension   . Hypothyroidism   . Irritable bowel syndrome   . Kidney disease   . Pneumonia   . Shortness of breath   . Weight loss    10-12 lbs in 2 months (03/2011)    Past Surgical History:  Procedure Laterality Date  . ANTERIOR AND POSTERIOR VAGINAL REPAIR    . CARDIAC CATHETERIZATION     6 years ago  . COLONOSCOPY    . ESOPHAGOGASTRODUODENOSCOPY    . EYE SURGERY Bilateral 09/2013   cataract surgery with lens implant  . THYROIDECTOMY     Left  . TOE AMPUTATION Left    2nd toe  . TONSILLECTOMY    . TOTAL HIP ARTHROPLASTY  04/24/2011   Procedure: TOTAL HIP ARTHROPLASTY;  Surgeon: Kerin Salen, MD;  Location: [facility];  Service: Orthopedics;  Laterality: Right;  DEPUY/PINNACLE CUP, SUMMIT BASIC STEM  . TOTAL HIP ARTHROPLASTY Left 04/20/2014   Procedure: TOTAL HIP ARTHROPLASTY;  Surgeon: Kerin Salen, MD;  Location: [facility];  Service: Orthopedics;  Laterality: Left;    There were no vitals filed for this  visit.  Subjective Assessment - 10/02/17 0934    Subjective  Patient reports she forgot about her MD appointment this week. Forgot her cane today. Had an outbreak of something earlier this week. PA things she has shingles or herpes on her backside. Also reports R shoulder pain that radiated to R side of chest, painful with breathing.     Pertinent History  B THA, PVD, COPD, thyroid nodule, CAD, bronchiectasis     Diagnostic tests  per-patient had xray 08/20/17 but not sure of results     Patient Stated Goals  pressing garage button without pain    Currently in Pain?  No/denies                       [facility] Adult PT Treatment/Exercise - 10/02/17 0001      Ambulation/Gait   Ambulation/Gait Assistance  4: Min guard    Ambulation Distance (Feet)  500 Feet    Assistive device  None;Straight cane    Gait Comments  gait training with increased speed, turning, stepping over obstacles, figure 8, grapevine; with CGA and cues for foot placement, upright trunk  Shoulder Exercises: Supine   Protraction  Strengthening;Both;15 reps;Weights;Limitations    Protraction Weight (lbs)  1    Protraction Limitations  serratus punches      Shoulder Exercises: Sidelying   External Rotation  Right;AROM;15 reps;Weights;Limitations    External Rotation Weight (lbs)  1    External Rotation Limitations  Cues for scapular squeeze and dowel under elbow    ABduction  10 reps;Right;AROM;Weights;Limitations    ABduction Weight (lbs)  1    ABduction Limitations  to tolerance; thumb up; cues for scap squeeze      Shoulder Exercises: Pulleys   Flexion  3 minutes    Scaption  3 minutes      Shoulder Exercises: Stretch   Other Shoulder Stretches  R ER stretch in doorway with CGA; 2x20"    Other Shoulder Stretches  R pec stretch 90/90 in doorway; 2x30" to tolerance with CGA      Manual Therapy   Manual Therapy  Soft tissue mobilization;Passive ROM    Manual therapy comments  Supine     Soft tissue  mobilization  R biceps and anterior delt- TTP and soft tissue restriction    Passive ROM  R shoulder PROM all directions to tolerance                PT Short Term Goals - 09/25/17 0935      PT SHORT TERM GOAL #1   Title  Patient to be independent with initial HEP.    Time  3    Period  Weeks    Status  Achieved        PT Long Term Goals - 09/25/17 0935      PT LONG TERM GOAL #1   Title  Patient to be independent with advanced HEP.    Time  6    Period  Weeks    Status  On-going reports inconsistency for the past 10 days d/t dog dying      PT LONG TERM GOAL #2   Title  Patient to demonstrate >=4/5 strength in R shoulder without pain limiting.    Time  6    Period  Weeks    Status  Partially Met improvements demonstrated in R shoulder ABD      PT LONG TERM GOAL #3   Title  Patient to demonstrate R shoulder AROM/PROM WFL and symmetrical to opposite side.    Time  6    Period  Weeks    Status  On-going improvements demonstrated in R shoulder ABD and ER AROM, flexion, ABD, IR PROM      PT LONG TERM GOAL #4   Title  Patient to report tolerance of R shoulder overhead reaching to press garage button at home.     Time  6    Period  Weeks    Status  Achieved reports able to perform this at this time, much improved      PT LONG TERM GOAL #5   Title  Patient to demonstrate TUG <14 sec with LRAD without evidence of instability to decreased risk of falls.     Time  6    Period  Weeks    Status  On-going scored 17.3 sec without AD            Plan - 10/02/17 1041    Clinical Impression Statement  Patient arrived to session ambulating without SPC- reports she forgot it. Additional report of increased pain in R shoulder, intermittently radiating to R pec,  worse with breathing. Patient missed last MD follow up appointment; advised patient to call and schedule another appointment to rule out pulmonary involvement. Patient reported understanding. Tolerated STM to R R biceps  and anterior delt- TTP and soft tissue restriction in these areas but tolerable. Also good tolerance of R PROM this date. Performed R shoulder stretching in doorway- patient requiring CGA to maintain balance and she has a tendency for narrow BOS. Addressed balance and decreased gait speed this session, focusing on gait training with speed, turning, and obstacle challenges without SPC and with CGA. Patient with no evidence of imbalance however maintaining kyphotic posturing throughout- mild improvement with cueing. Also worked on [employer]- patient with difficulty sequencing and narrow BOS, mildly improved with cueing. Advised patient not to perform these activities at home d/t risk of falls and to bring [facility] to appointment next time. Patient reported understanding.     Clinical Impairments Affecting Rehab Potential  B THA, PVD, COPD, thyroid nodule, CAD, bronchiectasis     PT Treatment/Interventions  ADLs/Self Care Home Management;Cryotherapy;Electrical Stimulation;Moist Heat;Ultrasound;Gait training;Stair training;Functional mobility training;Therapeutic activities;Therapeutic exercise;Manual techniques;Patient/family education;Balance training;Neuromuscular re-education;Passive range of motion;Dry needling;Energy conservation;Splinting;Taping;Vasopneumatic Device    Consulted and Agree with Plan of Care  Patient       Patient will benefit from skilled therapeutic intervention in order to improve the following deficits and impairments:  Pain, Impaired UE functional use, Decreased strength, Decreased activity tolerance, Decreased balance, Decreased mobility, Difficulty walking, Improper body mechanics, Decreased range of motion, Impaired flexibility, Postural dysfunction  Visit Diagnosis: Acute pain of right shoulder  Stiffness of right shoulder, not elsewhere classified  Muscle weakness (generalized)  Unsteadiness on feet  Other symptoms and signs involving the musculoskeletal  system     Problem List Patient Active Problem List   Diagnosis Date Noted  . Anxiety and depression 07/17/2017  . Chest pain of uncertain etiology 01/01/2810  . Anemia 07/21/2016  . Arthritis, hip 04/20/2014  . Can't get food down 02/01/2014  . H/O cataract extraction 09/02/2013  . Age-related macular degeneration, dry 08/03/2013  . Glaucoma suspect 08/03/2013  . DDD (degenerative disc disease), lumbosacral 09/25/2012  . External hemorrhoid 09/25/2012  . Pain in joint, ankle and foot 08/10/2012  . Contracture of toe of left foot 08/10/2012  . Atherosclerosis of renal artery ([place]) 08/08/2012  . OP (osteoporosis) 08/08/2012  . Rhinitis, nonallergic, chronic 01/19/2012  . GERD (gastroesophageal reflux disease) 01/19/2012  . Adult hypothyroidism 11/14/2011  . Atypical mycobacterial infection of lung ([place]) 07/03/2011  . Lung nodule 07/03/2011  . Osteoarthritis of right hip 04/26/2011  . Aseptic necrosis ([place]) 01/27/2011  . COPD (chronic obstructive pulmonary disease) with chronic bronchitis ([place]) 12/17/2010   Janene Harvey, PT, DPT 10/02/17 11:52 AM   [facility] [address]  [place], [place], [address] Phone: [phone]   Fax:  [phone]  Name: TANIEKA POWNALL MRN: [id number] Date of Birth: [birth year]

## 2017-10-06 ENCOUNTER — Ambulatory Visit: Payer: Medicare Other

## 2017-10-06 DIAGNOSIS — M25511 Pain in right shoulder: Secondary | ICD-10-CM

## 2017-10-06 DIAGNOSIS — R29898 Other symptoms and signs involving the musculoskeletal system: Secondary | ICD-10-CM

## 2017-10-06 DIAGNOSIS — R2681 Unsteadiness on feet: Secondary | ICD-10-CM

## 2017-10-06 DIAGNOSIS — M25611 Stiffness of right shoulder, not elsewhere classified: Secondary | ICD-10-CM

## 2017-10-06 DIAGNOSIS — M6281 Muscle weakness (generalized): Secondary | ICD-10-CM

## 2017-10-06 NOTE — Therapy (Signed)
[facility] [address]  [place], [place], [address] Phone: [phone]   Fax:  [phone]  Physical Therapy Treatment  Patient Details  Name: [name] MRN: [id number] Date of Birth: Jan 11, 1929 Referring Provider: Almedia Balls, MD   Encounter Date: 10/06/2017  PT End of Session - 10/06/17 0942    Visit Number  9    Number of Visits  13    Date for PT Re-Evaluation  10/09/17    Authorization Type  Medicare & UMR    PT Start Time  0931    PT Stop Time  1014    PT Time Calculation (min)  43 min    Equipment Utilized During Treatment  Gait belt    Activity Tolerance  Patient tolerated treatment well    Behavior During Therapy  [facility] for tasks assessed/performed       Past Medical History:  Diagnosis Date  . Abnormal heart rhythm    pt not sure what kind.  . Anemia   . Arthritis   . COPD (chronic obstructive pulmonary disease) ([place])   . Dry skin   . GERD (gastroesophageal reflux disease)   . Hypertension   . Hypothyroidism   . Irritable bowel syndrome   . Kidney disease   . Pneumonia   . Shortness of breath   . Weight loss    10-12 lbs in 2 months (03/2011)    Past Surgical History:  Procedure Laterality Date  . ANTERIOR AND POSTERIOR VAGINAL REPAIR    . CARDIAC CATHETERIZATION     6 years ago  . COLONOSCOPY    . ESOPHAGOGASTRODUODENOSCOPY    . EYE SURGERY Bilateral 09/2013   cataract surgery with lens implant  . THYROIDECTOMY     Left  . TOE AMPUTATION Left    2nd toe  . TONSILLECTOMY    . TOTAL HIP ARTHROPLASTY  04/24/2011   Procedure: TOTAL HIP ARTHROPLASTY;  Surgeon: Kerin Salen, MD;  Location: [facility];  Service: Orthopedics;  Laterality: Right;  DEPUY/PINNACLE CUP, SUMMIT BASIC STEM  . TOTAL HIP ARTHROPLASTY Left 04/20/2014   Procedure: TOTAL HIP ARTHROPLASTY;  Surgeon: Kerin Salen, MD;  Location: [facility];  Service: Orthopedics;  Laterality: Left;    There were no vitals filed for this  visit.  Subjective Assessment - 10/06/17 0941    Subjective  Pt. reporting R shoulder is feeling better today.      Pertinent History  B THA, PVD, COPD, thyroid nodule, CAD, bronchiectasis     Diagnostic tests  per-patient had xray 08/20/17 but not sure of results     Patient Stated Goals  pressing garage button without pain    Currently in Pain?  No/denies    Pain Score  0-No pain up to 5/10 pain last night     Multiple Pain Sites  No                       [facility] Adult PT Treatment/Exercise - 10/06/17 1218      High Level Balance   High Level Balance Activities  Side stepping 2 x 50 ft; cues required to increased LE clearance       Knee/Hip Exercises: Standing   Other Standing Knee Exercises  Alternating 1/2 foam bolster step over and back with SPC and CGA from therapist x 10 reps each LE To focus on improving LE clearance       Shoulder Exercises: Supine  External Rotation  Right;Theraband;Strengthening;15 reps    Theraband Level (Shoulder External Rotation)  Level 1 (Yellow)    Internal Rotation  Right;15 reps;Theraband;Strengthening    Theraband Level (Shoulder Internal Rotation)  Level 1 (Yellow)      Shoulder Exercises: Seated   Row  15 reps;Both;Theraband;Strengthening    Theraband Level (Shoulder Row)  Level 2 (Red)    Row Limitations  cues for full scap. retraction     External Rotation  Right;15 reps;Theraband    Theraband Level (Shoulder External Rotation)  Level 1 (Yellow)    Internal Rotation  Right;15 reps;Theraband    Theraband Level (Shoulder Internal Rotation)  Level 1 (Yellow)      Manual Therapy   Manual Therapy  Soft tissue mobilization;Passive ROM    Manual therapy comments  Supine     Soft tissue mobilization  R shoulder STM to shoulder complex and anterior shoulder over area of most tenderness             PT Education - 10/06/17 1227    Education Details  HEP update     Person(s) Educated  Patient    Methods   Explanation;Demonstration;Verbal cues;Handout    Comprehension  Verbalized understanding;Returned demonstration;Verbal cues required;Need further instruction       PT Short Term Goals - 09/25/17 0935      PT SHORT TERM GOAL #1   Title  Patient to be independent with initial HEP.    Time  3    Period  Weeks    Status  Achieved        PT Long Term Goals - 09/25/17 0935      PT LONG TERM GOAL #1   Title  Patient to be independent with advanced HEP.    Time  6    Period  Weeks    Status  On-going reports inconsistency for the past 10 days d/t dog dying      PT LONG TERM GOAL #2   Title  Patient to demonstrate >=4/5 strength in R shoulder without pain limiting.    Time  6    Period  Weeks    Status  Partially Met improvements demonstrated in R shoulder ABD      PT LONG TERM GOAL #3   Title  Patient to demonstrate R shoulder AROM/PROM WFL and symmetrical to opposite side.    Time  6    Period  Weeks    Status  On-going improvements demonstrated in R shoulder ABD and ER AROM, flexion, ABD, IR PROM      PT LONG TERM GOAL #4   Title  Patient to report tolerance of R shoulder overhead reaching to press garage button at home.     Time  6    Period  Weeks    Status  Achieved reports able to perform this at this time, much improved      PT LONG TERM GOAL #5   Title  Patient to demonstrate TUG <14 sec with LRAD without evidence of instability to decreased risk of falls.     Time  6    Period  Weeks    Status  On-going scored 17.3 sec without AD            Plan - 10/06/17 1222    Clinical Impression Statement  Gay Filler seen to start session today ambulating without SPC and with continued narrow BOS.  With notable upper body sway and overt LOB today x 2 requiring therapist CGA to  correct.  Pt. strongly encouraged to ambulate with SPC when at home and in community for improved safety.  Session focusing on gentle RTC strengthening and standing activities to promote improved LE  clearance to decrease fall risk.  Pt. able to perform shoulder IR/ER with yellow TB well today thus HEP updated with these activities.  Will continue to progress toward goals.      PT Treatment/Interventions  ADLs/Self Care Home Management;Cryotherapy;Electrical Stimulation;Moist Heat;Ultrasound;Gait training;Stair training;Functional mobility training;Therapeutic activities;Therapeutic exercise;Manual techniques;Patient/family education;Balance training;Neuromuscular re-education;Passive range of motion;Dry needling;Energy conservation;Splinting;Taping;Vasopneumatic Device    PT Next Visit Plan  10th visit progress note     Consulted and Agree with Plan of Care  Patient       Patient will benefit from skilled therapeutic intervention in order to improve the following deficits and impairments:  Pain, Impaired UE functional use, Decreased strength, Decreased activity tolerance, Decreased balance, Decreased mobility, Difficulty walking, Improper body mechanics, Decreased range of motion, Impaired flexibility, Postural dysfunction  Visit Diagnosis: Acute pain of right shoulder  Stiffness of right shoulder, not elsewhere classified  Muscle weakness (generalized)  Unsteadiness on feet  Other symptoms and signs involving the musculoskeletal system     Problem List Patient Active Problem List   Diagnosis Date Noted  . Anxiety and depression 07/17/2017  . Chest pain of uncertain etiology 79/81/0254  . Anemia 07/21/2016  . Arthritis, hip 04/20/2014  . Can't get food down 02/01/2014  . H/O cataract extraction 09/02/2013  . Age-related macular degeneration, dry 08/03/2013  . Glaucoma suspect 08/03/2013  . DDD (degenerative disc disease), lumbosacral 09/25/2012  . External hemorrhoid 09/25/2012  . Pain in joint, ankle and foot 08/10/2012  . Contracture of toe of left foot 08/10/2012  . Atherosclerosis of renal artery ([place]) 08/08/2012  . OP (osteoporosis) 08/08/2012  . Rhinitis,  nonallergic, chronic 01/19/2012  . GERD (gastroesophageal reflux disease) 01/19/2012  . Adult hypothyroidism 11/14/2011  . Atypical mycobacterial infection of lung ([place]) 07/03/2011  . Lung nodule 07/03/2011  . Osteoarthritis of right hip 04/26/2011  . Aseptic necrosis ([place]) 01/27/2011  . COPD (chronic obstructive pulmonary disease) with chronic bronchitis ([place]) 12/17/2010    Bess Harvest, PTA 10/06/17 12:28 PM   [facility] [address]  [place], [place], [address] Phone: [phone]   Fax:  [phone]  Name: JAYLENA HOLLOWAY MRN: [id number] Date of Birth: Jun 26, 1928

## 2017-10-09 ENCOUNTER — Ambulatory Visit: Payer: Medicare Other | Admitting: Physical Therapy

## 2017-10-09 DIAGNOSIS — M6281 Muscle weakness (generalized): Secondary | ICD-10-CM

## 2017-10-09 DIAGNOSIS — R2681 Unsteadiness on feet: Secondary | ICD-10-CM

## 2017-10-09 DIAGNOSIS — M25511 Pain in right shoulder: Secondary | ICD-10-CM | POA: Diagnosis not present

## 2017-10-09 DIAGNOSIS — R29898 Other symptoms and signs involving the musculoskeletal system: Secondary | ICD-10-CM

## 2017-10-09 DIAGNOSIS — M25611 Stiffness of right shoulder, not elsewhere classified: Secondary | ICD-10-CM

## 2017-10-09 NOTE — Therapy (Addendum)
[facility] [address]  [place], [place], [address] Phone: [phone]   Fax:  [phone]  Physical Therapy Progress Note  Patient Details  Name: [name] MRN: [id number] Date of Birth: November 07, 1928 Referring Provider: Almedia Balls, MD   Progress Note Reporting Period 08/28/17 to 10/09/17  See note below for Objective Data and Assessment of Progress/Goals.     Encounter Date: 10/09/2017  PT End of Session - 10/09/17 1238    Visit Number  10    Number of Visits  13    Date for PT Re-Evaluation  10/09/17    Authorization Type  Medicare & U[employer]    PT Start Time  0932    PT Stop Time  1015    PT Time Calculation (min)  43 min    Equipment Utilized During Treatment  Gait belt    Activity Tolerance  Patient tolerated treatment well    Behavior During Therapy  WFL for tasks assessed/performed       Past Medical History:  Diagnosis Date  . Abnormal heart rhythm    pt not sure what kind.  . Anemia   . Arthritis   . COPD (chronic obstructive pulmonary disease) ([place])   . Dry skin   . GERD (gastroesophageal reflux disease)   . Hypertension   . Hypothyroidism   . Irritable bowel syndrome   . Kidney disease   . Pneumonia   . Shortness of breath   . Weight loss    10-12 lbs in 2 months (03/2011)    Past Surgical History:  Procedure Laterality Date  . ANTERIOR AND POSTERIOR VAGINAL REPAIR    . CARDIAC CATHETERIZATION     6 years ago  . COLONOSCOPY    . ESOPHAGOGASTRODUODENOSCOPY    . EYE SURGERY Bilateral 09/2013   cataract surgery with lens implant  . THYROIDECTOMY     Left  . TOE AMPUTATION Left    2nd toe  . TONSILLECTOMY    . TOTAL HIP ARTHROPLASTY  04/24/2011   Procedure: TOTAL HIP ARTHROPLASTY;  Surgeon: Kerin Salen, MD;  Location: [facility];  Service: Orthopedics;  Laterality: Right;  DEPUY/PINNACLE CUP, SUMMIT BASIC STEM  . TOTAL HIP ARTHROPLASTY Left 04/20/2014   Procedure: TOTAL HIP  ARTHROPLASTY;  Surgeon: Kerin Salen, MD;  Location: [facility];  Service: Orthopedics;  Laterality: Left;    There were no vitals filed for this visit.  Subjective Assessment - 10/09/17 0933    Subjective  Reported that she vacuumed this week and had a lot of pain in R shoulder after this. That day she had breathing pain. Has not had it since. Reports 60% improvement. Still notes difficulty with vacuuming, moving it. Reports improvement in reaching garage button, using hand.    Pertinent History  B THA, PVD, COPD, thyroid nodule, CAD, bronchiectasis     Diagnostic tests  per-patient had xray 08/20/17 but not sure of results     Patient Stated Goals  pressing garage button without pain    Currently in Pain?  No/denies         [facility] PT Assessment - 10/09/17 0001      Observation/Other Assessments   Focus on Therapeutic Outcomes (FOTO)   shoulder: 59 (41% limited, 39% predicted)      AROM   Right/Left Shoulder  Right    Right Shoulder Flexion  135 Degrees    Right Shoulder ABduction  109 Degrees    Right  Shoulder Internal Rotation  20 Degrees    Right Shoulder External Rotation  75 Degrees      PROM   Right/Left Shoulder  Right    Right Shoulder Flexion  173 Degrees    Right Shoulder ABduction  163 Degrees    Right Shoulder Internal Rotation  73 Degrees    Right Shoulder External Rotation  80 Degrees      Strength   Right/Left Shoulder  Right    Right Shoulder Flexion  4/5    Right Shoulder ABduction  4/5    Right Shoulder Internal Rotation  3+/5    Right Shoulder External Rotation  3+/5                   [facility] Adult PT Treatment/Exercise - 10/09/17 0001      Balance   Balance Assessed  Yes      Neuro Re-ed    Neuro Re-ed Details   tandem walking, lateral and anterior stepping over obstacle, sidestepping x 12 min      Shoulder Exercises: Supine   Protraction  Strengthening;Both;15 reps;Weights;Limitations    Protraction Weight (lbs)  1    Protraction  Limitations  serratus punches    ABduction  Strengthening;Both;Theraband;Limitations;15 reps    Theraband Level (Shoulder ABduction)  Level 1 (Yellow)    ABduction Limitations  B horizontal ABD with yellow TB      Shoulder Exercises: Sidelying   External Rotation  Right;AROM;15 reps;Weights;Limitations    External Rotation Weight (lbs)  1    External Rotation Limitations  Cues for scapular squeeze and dowel under elbow    ABduction  10 reps;Right;AROM;Weights;Limitations    ABduction Weight (lbs)  0    ABduction Limitations  thumb up; manual cues for scap squeeze; unable to tolerate 1#      Shoulder Exercises: Pulleys   Flexion  3 minutes    Scaption  3 minutes      Manual Therapy   Manual Therapy  Passive ROM    Passive ROM  R shoulder PROM all directions to tolerance - mild pain at end range IR and ER               PT Short Term Goals - 10/09/17 3662      PT SHORT TERM GOAL #1   Title  Patient to be independent with initial HEP.    Time  3    Period  Weeks    Status  Achieved        PT Long Term Goals - 10/09/17 9476      PT LONG TERM GOAL #1   Title  Patient to be independent with advanced HEP.    Time  6    Period  Weeks    Status  Achieved      PT LONG TERM GOAL #2   Title  Patient to demonstrate >=4/5 strength in R shoulder without pain limiting.    Time  6    Period  Weeks    Status  Partially Met      PT LONG TERM GOAL #3   Title  Patient to demonstrate R shoulder AROM/PROM WFL and symmetrical to opposite side.    Time  6    Period  Weeks    Status  Not Met      PT LONG TERM GOAL #4   Title  Patient to report tolerance of R shoulder overhead reaching to press garage button at home.  Time  6    Period  Weeks    Status  Achieved      PT LONG TERM GOAL #5   Title  Patient to demonstrate TUG <14 sec with LRAD without evidence of instability to decreased risk of falls.     Time  6    Period  Weeks    Status  Not Met             Plan - 10/09/17 1242    Clinical Impression Statement  Patient arrived to session with report of 60% improvement since initial eval. Reports improvement in ability to reach garage button overhead and use her R hand. Still noting difficulty with vacuuming. Patient still reporting night pain and breathing pain on R side. Updated goals- patient reporting compliance with HEP, demonstrated increased strength in R shoulder ABD, increased R shoulder IR, small improvement in TUG score demonstrated. Patient however demonstrating decreased R shoulder PROM this session. Spoke with patient about plateau in improvement- patient agreeable to follow up with MD and be put on 30 day hold at this time. Worked on RTC strengthening and balance activities this session. Patient with instability with tandem stance and hesitancy throughout. Advised patient not to perform these exercises at home to avoid risk of falls. Patient to be placed on 30 day hold at this time d/t plateau in progress. Patient agreeable.     Clinical Impairments Affecting Rehab Potential  B THA, PVD, COPD, thyroid nodule, CAD, bronchiectasis     PT Next Visit Plan  30 day hold at this time    Consulted and Agree with Plan of Care  Patient       Patient will benefit from skilled therapeutic intervention in order to improve the following deficits and impairments:  Pain, Impaired UE functional use, Decreased strength, Decreased activity tolerance, Decreased balance, Decreased mobility, Difficulty walking, Improper body mechanics, Decreased range of motion, Impaired flexibility, Postural dysfunction  Visit Diagnosis: Acute pain of right shoulder  Stiffness of right shoulder, not elsewhere classified  Muscle weakness (generalized)  Unsteadiness on feet  Other symptoms and signs involving the musculoskeletal system     Problem List Patient Active Problem List   Diagnosis Date Noted  . Anxiety and depression 07/17/2017  . Chest  pain of uncertain etiology 23/53/6144  . Anemia 07/21/2016  . Arthritis, hip 04/20/2014  . Can't get food down 02/01/2014  . H/O cataract extraction 09/02/2013  . Age-related macular degeneration, dry 08/03/2013  . Glaucoma suspect 08/03/2013  . DDD (degenerative disc disease), lumbosacral 09/25/2012  . External hemorrhoid 09/25/2012  . Pain in joint, ankle and foot 08/10/2012  . Contracture of toe of left foot 08/10/2012  . Atherosclerosis of renal artery ([place] [place]) 08/08/2012  . OP (osteoporosis) 08/08/2012  . Rhinitis, nonallergic, chronic 01/19/2012  . GERD (gastroesophageal reflux disease) 01/19/2012  . Adult hypothyroidism 11/14/2011  . Atypical mycobacterial infection of lung ([place]) 07/03/2011  . Lung nodule 07/03/2011  . Osteoarthritis of right hip 04/26/2011  . Aseptic necrosis ([place]) 01/27/2011  . COPD (chronic obstructive pulmonary disease) with chronic bronchitis ([place]) 12/17/2010    Janene Harvey, PT, DPT 10/09/17 1:02 PM   [facility] [address]  [place], [place], [address] Phone: [phone]   Fax:  [phone]  Name: BIRDIE FETTY MRN: [id number] Date of Birth: Dec 17, 1928  PHYSICAL THERAPY DISCHARGE SUMMARY  Visits from Start of Care: 10  Current functional level related to goals / functional outcomes:  See above clinical summary; patient placed on hold d/t plateau in progress   Remaining deficits: Decreased strength and ROM in R shoulder, decreased gait speed   Education / Equipment: HEP  Plan: Patient agrees to discharge.  Patient goals were partially met. Patient is being discharged due to not returning since the last visit.  ?????     Janene Harvey, PT, DPT 11/12/17 12:52 PM

## 2018-01-06 ENCOUNTER — Emergency Department (HOSPITAL_BASED_OUTPATIENT_CLINIC_OR_DEPARTMENT_OTHER): Payer: Medicare Other

## 2018-01-06 ENCOUNTER — Emergency Department (HOSPITAL_BASED_OUTPATIENT_CLINIC_OR_DEPARTMENT_OTHER)
Admission: EM | Admit: 2018-01-06 | Discharge: 2018-01-06 | Disposition: A | Discharge status: Home / Self Care | Payer: Medicare Other | Attending: Emergency Medicine | Admitting: Emergency Medicine

## 2018-01-06 ENCOUNTER — Encounter (HOSPITAL_BASED_OUTPATIENT_CLINIC_OR_DEPARTMENT_OTHER): Payer: Self-pay | Admitting: Emergency Medicine

## 2018-01-06 ENCOUNTER — Other Ambulatory Visit: Payer: Self-pay

## 2018-01-06 DIAGNOSIS — S0990XA Unspecified injury of head, initial encounter: Secondary | ICD-10-CM | POA: Diagnosis present

## 2018-01-06 DIAGNOSIS — S80211A Abrasion, right knee, initial encounter: Secondary | ICD-10-CM | POA: Insufficient documentation

## 2018-01-06 DIAGNOSIS — W19XXXA Unspecified fall, initial encounter: Secondary | ICD-10-CM

## 2018-01-06 DIAGNOSIS — Y999 Unspecified external cause status: Secondary | ICD-10-CM | POA: Insufficient documentation

## 2018-01-06 DIAGNOSIS — S01112A Laceration without foreign body of left eyelid and periocular area, initial encounter: Secondary | ICD-10-CM | POA: Diagnosis not present

## 2018-01-06 DIAGNOSIS — Y9289 Other specified places as the place of occurrence of the external cause: Secondary | ICD-10-CM | POA: Diagnosis not present

## 2018-01-06 DIAGNOSIS — J449 Chronic obstructive pulmonary disease, unspecified: Secondary | ICD-10-CM | POA: Insufficient documentation

## 2018-01-06 DIAGNOSIS — Z79899 Other long term (current) drug therapy: Secondary | ICD-10-CM | POA: Insufficient documentation

## 2018-01-06 DIAGNOSIS — Z87891 Personal history of nicotine dependence: Secondary | ICD-10-CM | POA: Insufficient documentation

## 2018-01-06 DIAGNOSIS — Y93K1 Activity, walking an animal: Secondary | ICD-10-CM | POA: Diagnosis not present

## 2018-01-06 DIAGNOSIS — E039 Hypothyroidism, unspecified: Secondary | ICD-10-CM | POA: Diagnosis not present

## 2018-01-06 DIAGNOSIS — S80212A Abrasion, left knee, initial encounter: Secondary | ICD-10-CM | POA: Diagnosis not present

## 2018-01-06 DIAGNOSIS — Z7982 Long term (current) use of aspirin: Secondary | ICD-10-CM | POA: Insufficient documentation

## 2018-01-06 DIAGNOSIS — W01198A Fall on same level from slipping, tripping and stumbling with subsequent striking against other object, initial encounter: Secondary | ICD-10-CM | POA: Diagnosis not present

## 2018-01-06 DIAGNOSIS — S80219A Abrasion, unspecified knee, initial encounter: Secondary | ICD-10-CM

## 2018-01-06 MED ORDER — LIDOCAINE-EPINEPHRINE (PF) 2 %-1:200000 IJ SOLN
INTRAMUSCULAR | Status: AC
  Administered 2018-01-06: 10 mL
  Filled 2018-01-06: qty 10

## 2018-01-06 MED ORDER — LIDOCAINE-EPINEPHRINE 2 %-1:100000 IJ SOLN
20.0000 mL | Freq: Once | INTRAMUSCULAR | Status: DC
  Filled 2018-01-06: qty 20

## 2018-01-06 NOTE — ED Provider Notes (Signed)
[facility] EMERGENCY DEPARTMENT Provider Note   CSN: [id number] Arrival date & time: 01/06/18  1027     History   Chief Complaint Chief Complaint  Patient presents with  . Fall    HPI [name] is a 84 y.o. female.  84 yo F with a chief complaint of a fall.  The patient was walking her dog and she lost her balance and fell.  Struck her face on the ground.  Complaining mostly of left facial pain.  She is not ambulated since then it called 911 and was brought here.  She denies any change in her vision.  Denies double vision.  Denies chest pain or abdominal pain or back pain.  Has chronic spasms to the left thigh which is not changed.  The history is provided by the patient.  Fall  This is a new problem. Associated symptoms include headaches. Pertinent negatives include no chest pain and no shortness of breath.  Illness  This is a new problem. The current episode started 2 days ago. The problem occurs constantly. The problem has not changed since onset.Associated symptoms include headaches. Pertinent negatives include no chest pain and no shortness of breath. Nothing aggravates the symptoms. Nothing relieves the symptoms. She has tried nothing for the symptoms. The treatment provided no relief.    Past Medical History:  Diagnosis Date  . Abnormal heart rhythm    pt not sure what kind.  . Anemia   . Arthritis   . COPD (chronic obstructive pulmonary disease) ([facility])   . Dry skin   . GERD (gastroesophageal reflux disease)   . Hypertension   . Hypothyroidism   . Irritable bowel syndrome   . Kidney disease   . Pneumonia   . Shortness of breath   . Weight loss    10-12 lbs in 2 months (03/2011)    Patient Active Problem List   Diagnosis Date Noted  . Anxiety and depression 07/17/2017  . Chest pain of uncertain etiology 01/17/2017  . Anemia 07/21/2016  . Arthritis, hip 04/20/2014  . Can't get food down 02/01/2014  . H/O cataract extraction 09/02/2013  .  Age-related macular degeneration, dry 08/03/2013  . Glaucoma suspect 08/03/2013  . DDD (degenerative disc disease), lumbosacral 09/25/2012  . External hemorrhoid 09/25/2012  . Pain in joint, ankle and foot 08/10/2012  . Contracture of toe of left foot 08/10/2012  . Atherosclerosis of renal artery ([facility]) 08/08/2012  . OP (osteoporosis) 08/08/2012  . Rhinitis, nonallergic, chronic 01/19/2012  . GERD (gastroesophageal reflux disease) 01/19/2012  . Adult hypothyroidism 11/14/2011  . Atypical mycobacterial infection of lung ([facility]) 07/03/2011  . Lung nodule 07/03/2011  . Osteoarthritis of right hip 04/26/2011  . Aseptic necrosis ([facility]) 01/27/2011  . COPD (chronic obstructive pulmonary disease) with chronic bronchitis ([facility]) 12/17/2010    Past Surgical History:  Procedure Laterality Date  . ANTERIOR AND POSTERIOR VAGINAL REPAIR    . CARDIAC CATHETERIZATION     6 years ago  . COLONOSCOPY    . ESOPHAGOGASTRODUODENOSCOPY    . EYE SURGERY Bilateral 09/2013   cataract surgery with lens implant  . THYROIDECTOMY     Left  . TOE AMPUTATION Left    2nd toe  . TONSILLECTOMY    . TOTAL HIP ARTHROPLASTY  04/24/2011   Procedure: TOTAL HIP ARTHROPLASTY;  Surgeon: Nestor Lewandowsky, MD;  Location: [facility];  Service: Orthopedics;  Laterality: Right;  DEPUY/PINNACLE CUP, SUMMIT BASIC STEM  . TOTAL HIP ARTHROPLASTY Left 04/20/2014  Procedure: TOTAL HIP ARTHROPLASTY;  Surgeon: Nestor Lewandowsky, MD;  Location: [facility];  Service: Orthopedics;  Laterality: Left;     OB History   None      Home Medications    Prior to Admission medications   Medication Sig Start Date End Date Taking? Authorizing Provider  acetaminophen (TYLENOL) 500 MG tablet Take 1,000 mg by mouth every 8 (eight) hours as needed.     [provider]  albuterol (PROVENTIL HFA;VENTOLIN HFA) 108 (90 Base) MCG/ACT inhaler Inhale 2 puffs into the lungs every 6 (six) hours as needed for wheezing or shortness of breath. 07/17/16   Jetty Duhamel  D, MD  amLODipine (NORVASC) 5 MG tablet Take 1 tablet by mouth daily. 10/14/10   [provider]  aspirin EC 81 MG tablet Take 1 tablet by mouth daily.    [provider]  benzonatate (TESSALON) 200 MG capsule Take 1 capsule (200 mg total) 3 (three) times daily as needed by mouth for cough. 01/17/17   Jetty Duhamel D, MD  calcium carbonate (OS-CAL) 600 MG TABS Take 600 mg by mouth daily.    [provider]  DULoxetine (CYMBALTA) 60 MG capsule Take 60 mg by mouth 3 (three) times a week.     [provider]  famotidine (PEPCID) 40 MG tablet Take 1 tablet (40 mg total) daily by mouth. 01/17/17   Young, Joni Fears D, MD  ipratropium (ATROVENT) 0.06 % nasal spray Place 2 sprays 4 (four) times daily into the nose. If needed 01/17/17 07/31/19  Waymon Budge, MD  levothyroxine (SYNTHROID, LEVOTHROID) 50 MCG tablet Take 50 mcg by mouth daily before breakfast.    [provider]  metoprolol (TOPROL-XL) 50 MG 24 hr tablet Take 50 mg by mouth 2 (two) times daily.     [provider]  Multiple Vitamins-Minerals (ICAPS AREDS 2) CAPS Take 1 capsule by mouth 2 (two) times daily.    [provider]  umeclidinium bromide (INCRUSE ELLIPTA) 62.5 MCG/INH AEPB Inhale 1 puff into the lungs daily. 12/27/16   Waymon Budge, MD    Family History Family History  Problem Relation Age of Onset  . Emphysema Sister        smoker  . Allergies Brother   . Heart disease Brother   . Rheum arthritis Daughter   . Cancer Mother        GI  . Cancer Brother        throat  . Cancer Sister        unsure    Social History Social History   Tobacco Use  . Smoking status: Former Smoker    Packs/day: 2.00    Years: 26.00    Pack years: 52.00    Types: Cigarettes    Last attempt to quit: 03/04/1982    Years since quitting: 35.8  . Smokeless tobacco: Never Used  Substance Use Topics  . Alcohol use: Yes    Alcohol/week: 1.0 standard drinks    Types: 1 Shots  of liquor per week  . Drug use: No     Allergies   Avelox [moxifloxacin hcl in nacl]; Biaxin [clarithromycin]; and Nsaids   Review of Systems Review of Systems  Constitutional: Negative for chills and fever.  HENT: Negative for congestion and rhinorrhea.   Eyes: Negative for redness and visual disturbance.  Respiratory: Negative for shortness of breath and wheezing.   Cardiovascular: Negative for chest pain and palpitations.  Gastrointestinal: Negative for nausea and vomiting.  Genitourinary: Negative for dysuria and urgency.  Musculoskeletal: Negative for arthralgias and myalgias.  Skin: Positive for wound. Negative for pallor.  Neurological: Positive for headaches. Negative for dizziness.     Physical Exam Updated Vital Signs BP 106/78 (BP Location: Right Arm)   Pulse 67   Temp 97.9 F (36.6 C) (Oral)   Resp 14   Ht 5' (1.524 m)   Wt 49 kg   SpO2 96%   BMI 21.09 kg/m   Physical Exam  Constitutional: She is oriented to person, place, and time. She appears well-developed and well-nourished. No distress.  HENT:  Head: Normocephalic.  3 similar laceration to the left eyelid.  Extraocular motor is intact.  Small chip to the left lateral incisor no exposed pulp.  Teeth appear to fit together normally.  No pain to the jaw.  No midline spinal tenderness able to rotate her head 45 degrees in either direction without pain.  Superficial laceration to the inside of the lower lip.  Eyes: Pupils are equal, round, and reactive to light. EOM are normal.  Neck: Normal range of motion. Neck supple.  Cardiovascular: Normal rate and regular rhythm. Exam reveals no gallop and no friction rub.  No murmur heard. Pulmonary/Chest: Effort normal. She has no wheezes. She has no rales.  Abdominal: Soft. She exhibits no distension. There is no tenderness.  Musculoskeletal: She exhibits no edema or tenderness.  Small abrasions to bilateral knees.  Full range of motion of bilateral knees  without pain.  Internal and external rotation of the legs without tenderness.  Palpated from head to toe without any other noted areas of bony tenderness.  Neurological: She is alert and oriented to person, place, and time.  Skin: Skin is warm and dry. She is not diaphoretic.  Psychiatric: She has a normal mood and affect. Her behavior is normal.  Nursing note and vitals reviewed.    ED Treatments / Results  Labs (all labs ordered are listed, but only abnormal results are displayed) Labs Reviewed - No data to display  EKG None  Radiology Ct Head Wo Contrast  Result Date: 01/06/2018 CLINICAL DATA:  Fall with blunt trauma to maxillofacial structures. Initial encounter. EXAM: CT HEAD WITHOUT CONTRAST CT MAXILLOFACIAL WITHOUT CONTRAST TECHNIQUE: Multidetector CT imaging of the head and maxillofacial structures were performed using the standard protocol without intravenous contrast. Multiplanar CT image reconstructions of the maxillofacial structures were also generated. COMPARISON:  01/16/2011 head CT FINDINGS: CT HEAD FINDINGS Brain: No evidence of acute infarction, hemorrhage, hydrocephalus, extra-axial collection or mass lesion/mass effect. Intermittent mild high-density appearance of the falx is stable. No parafalcine collection noted. Cerebral volume loss and mild for age chronic small vessel ischemia. Vascular: Atherosclerotic calcification Skull: Negative CT MAXILLOFACIAL FINDINGS Osseous: Negative for fracture or mandibular dislocation. Orbits: Soft tissue swelling superficial to the left orbit. No evidence of postseptal injury. Bilateral cataract resection. Sinuses: Negative Soft tissues: Swelling is noted above. No opaque foreign body or hematoma IMPRESSION: 1. No evidence of intracranial injury or facial fracture. 2. Left periorbital contusion.  No evidence of postseptal injury. Electronically Signed   By: Marnee Spring M.D.   On: 01/06/2018 11:27   Ct Maxillofacial Wo  Contrast  Result Date: 01/06/2018 CLINICAL DATA:  Fall with blunt trauma to maxillofacial structures. Initial encounter. EXAM: CT HEAD WITHOUT CONTRAST CT MAXILLOFACIAL WITHOUT CONTRAST TECHNIQUE: Multidetector CT imaging of the head and maxillofacial structures were performed using the standard protocol without intravenous contrast. Multiplanar CT image reconstructions of the maxillofacial structures  were also generated. COMPARISON:  01/16/2011 head CT FINDINGS: CT HEAD FINDINGS Brain: No evidence of acute infarction, hemorrhage, hydrocephalus, extra-axial collection or mass lesion/mass effect. Intermittent mild high-density appearance of the falx is stable. No parafalcine collection noted. Cerebral volume loss and mild for age chronic small vessel ischemia. Vascular: Atherosclerotic calcification Skull: Negative CT MAXILLOFACIAL FINDINGS Osseous: Negative for fracture or mandibular dislocation. Orbits: Soft tissue swelling superficial to the left orbit. No evidence of postseptal injury. Bilateral cataract resection. Sinuses: Negative Soft tissues: Swelling is noted above. No opaque foreign body or hematoma IMPRESSION: 1. No evidence of intracranial injury or facial fracture. 2. Left periorbital contusion.  No evidence of postseptal injury. Electronically Signed   By: Marnee Spring M.D.   On: 01/06/2018 11:27    Procedures .Marland KitchenLaceration Repair Date/Time: 01/06/2018 12:31 PM Performed by: Melene Plan, DO Authorized by: Melene Plan, DO   Consent:    Consent obtained:  Verbal   Consent given by:  Patient   Risks discussed:  Infection, pain, poor cosmetic result and poor wound healing   Alternatives discussed:  No treatment and delayed treatment Anesthesia (see MAR for exact dosages):    Anesthesia method:  Local infiltration   Local anesthetic:  Lidocaine 2% WITH epi Laceration details:    Location:  Face   Face location:  L upper eyelid   Extent:  Superficial   Length (cm):  4 Repair type:     Repair type:  Simple Pre-procedure details:    Preparation:  Patient was prepped and draped in usual sterile fashion Exploration:    Hemostasis achieved with:  Epinephrine and direct pressure   Wound exploration: entire depth of wound probed and visualized     Contaminated: no   Treatment:    Area cleansed with:  Saline   Amount of cleaning:  Standard   Irrigation volume:  20   Irrigation method:  Syringe   Visualized foreign bodies/material removed: no   Skin repair:    Repair method:  Sutures   Suture size:  5-0   Suture material:  Fast-absorbing gut   Suture technique:  Simple interrupted   Number of sutures:  6 Approximation:    Approximation:  Close Post-procedure details:    Dressing:  Open (no dressing)   Patient tolerance of procedure:  Tolerated well, no immediate complications   (including critical care time)  Medications Ordered in ED Medications  lidocaine-EPINEPHrine (XYLOCAINE W/EPI) 2 %-1:100000 (with pres) injection 20 mL ( Infiltration Canceled Entry 01/06/18 1153)  lidocaine-EPINEPHrine (XYLOCAINE W/EPI) 2 %-1:200000 (PF) injection (10 mLs  Given 01/06/18 1207)     Initial Impression / Assessment and Plan / ED Course  I have reviewed the triage vital signs and the nursing notes.  Pertinent labs & imaging results that were available during my care of the patient were reviewed by me and considered in my medical decision making (see chart for details).     84 yo F with a likely mechanical fall.  Patient has obvious signs of head and facial trauma.  Will obtain a CT of the head and the face.  Repair the wound at bedside.  CT the head viewed by me without intracranial hemorrhage.  CT face without fracture.  Discharge home.   12:32 PM:  I have discussed the diagnosis/risks/treatment options with the patient and family and believe the pt to be eligible for discharge home to follow-up with PCP. We also discussed returning to the ED immediately if new or worsening sx  occur.  We discussed the sx which are most concerning (e.g., sudden worsening pain, fever, inability to tolerate by mouth ) that necessitate immediate return. Medications administered to the patient during their visit and any new prescriptions provided to the patient are listed below.  Medications given during this visit Medications  lidocaine-EPINEPHrine (XYLOCAINE W/EPI) 2 %-1:100000 (with pres) injection 20 mL ( Infiltration Canceled Entry 01/06/18 1153)  lidocaine-EPINEPHrine (XYLOCAINE W/EPI) 2 %-1:200000 (PF) injection (10 mLs  Given 01/06/18 1207)      The patient appears reasonably screen and/or stabilized for discharge and I doubt any other medical condition or other [facility] requiring further screening, evaluation, or treatment in the ED at this time prior to discharge.       Final Clinical Impressions(s) / ED Diagnoses   Final diagnoses:  Fall, initial encounter  Left eyelid laceration, initial encounter  Abrasion of knee, unspecified laterality, initial encounter    ED Discharge Orders    None       Melene Plan, DO 01/06/18 1232

## 2018-01-06 NOTE — ED Triage Notes (Signed)
Per EMS, pt from home , was walking her dog , tripped, fell facing down , hit concrete, left eyebrow lac, hematoma same area. No loc, no neck nor back injury. No blood thinners intake. Alert and oriented x 4. Lives alone.

## 2018-01-06 NOTE — Discharge Instructions (Signed)
Return for redness, drainage fever, confusion

## 2018-01-19 ENCOUNTER — Ambulatory Visit: Payer: Medicare Other | Admitting: Internal Medicine

## 2018-01-26 ENCOUNTER — Emergency Department (HOSPITAL_BASED_OUTPATIENT_CLINIC_OR_DEPARTMENT_OTHER)
Admission: EM | Admit: 2018-01-26 | Discharge: 2018-01-26 | Disposition: A | Discharge status: Home / Self Care | Payer: Medicare Other | Attending: Emergency Medicine | Admitting: Emergency Medicine

## 2018-01-26 ENCOUNTER — Encounter (HOSPITAL_BASED_OUTPATIENT_CLINIC_OR_DEPARTMENT_OTHER): Payer: Self-pay

## 2018-01-26 ENCOUNTER — Other Ambulatory Visit: Payer: Self-pay

## 2018-01-26 DIAGNOSIS — I10 Essential (primary) hypertension: Secondary | ICD-10-CM | POA: Insufficient documentation

## 2018-01-26 DIAGNOSIS — Z23 Encounter for immunization: Secondary | ICD-10-CM | POA: Diagnosis not present

## 2018-01-26 DIAGNOSIS — Z79899 Other long term (current) drug therapy: Secondary | ICD-10-CM | POA: Insufficient documentation

## 2018-01-26 DIAGNOSIS — Z87891 Personal history of nicotine dependence: Secondary | ICD-10-CM | POA: Insufficient documentation

## 2018-01-26 DIAGNOSIS — W228XXA Striking against or struck by other objects, initial encounter: Secondary | ICD-10-CM | POA: Diagnosis not present

## 2018-01-26 DIAGNOSIS — J449 Chronic obstructive pulmonary disease, unspecified: Secondary | ICD-10-CM | POA: Insufficient documentation

## 2018-01-26 DIAGNOSIS — S51812A Laceration without foreign body of left forearm, initial encounter: Secondary | ICD-10-CM | POA: Diagnosis present

## 2018-01-26 DIAGNOSIS — Y999 Unspecified external cause status: Secondary | ICD-10-CM | POA: Diagnosis not present

## 2018-01-26 DIAGNOSIS — Z7982 Long term (current) use of aspirin: Secondary | ICD-10-CM | POA: Diagnosis not present

## 2018-01-26 DIAGNOSIS — Y939 Activity, unspecified: Secondary | ICD-10-CM | POA: Diagnosis not present

## 2018-01-26 DIAGNOSIS — Y929 Unspecified place or not applicable: Secondary | ICD-10-CM | POA: Diagnosis not present

## 2018-01-26 DIAGNOSIS — E039 Hypothyroidism, unspecified: Secondary | ICD-10-CM | POA: Insufficient documentation

## 2018-01-26 MED ORDER — TETANUS-DIPHTH-ACELL PERTUSSIS 5-2.5-18.5 LF-MCG/0.5 IM SUSP
0.5000 mL | Freq: Once | INTRAMUSCULAR | Status: AC
  Administered 2018-01-26: 0.5 mL via INTRAMUSCULAR

## 2018-01-26 MED ORDER — TETANUS-DIPHTH-ACELL PERTUSSIS 5-2.5-18.5 LF-MCG/0.5 IM SUSP
INTRAMUSCULAR | Status: AC
  Filled 2018-01-26: qty 0.5

## 2018-01-26 NOTE — ED Notes (Signed)
Dermabond given to PA Gerkas.

## 2018-01-26 NOTE — ED Triage Notes (Addendum)
Pt states she scrapped left FA on metal today-bulky dsg applied by nurse first is intact with no bleed through-reports as a skin tear that will require sutures-NAD-to triage in w/c

## 2018-01-26 NOTE — ED Provider Notes (Signed)
[facility] EMERGENCY DEPARTMENT Provider Note   CSN: [id number] Arrival date & time: 01/26/18  1608     History   Chief Complaint Chief Complaint  Patient presents with  . Arm Injury    HPI [name] is a 84 y.o. female who presents with a left forearm laceration.  She states that she scraped her skin on a metal grabber just prior to arrival. She controlled the bleeding at home. Nothing makes it better or worse. She denies significant pain. No elbow or wrist pain. Tetanus is not up to date.   HPI  Past Medical History:  Diagnosis Date  . Abnormal heart rhythm    pt not sure what kind.  . Anemia   . Arthritis   . COPD (chronic obstructive pulmonary disease) (HCC)   . Dry skin   . GERD (gastroesophageal reflux disease)   . Hypertension   . Hypothyroidism   . Irritable bowel syndrome   . Kidney disease   . Pneumonia   . Shortness of breath   . Weight loss    10-12 lbs in 2 months (03/2011)    Patient Active Problem List   Diagnosis Date Noted  . Anxiety and depression 07/17/2017  . Chest pain of uncertain etiology 01/17/2017  . Anemia 07/21/2016  . Arthritis, hip 04/20/2014  . Can't get food down 02/01/2014  . H/O cataract extraction 09/02/2013  . Age-related macular degeneration, dry 08/03/2013  . Glaucoma suspect 08/03/2013  . DDD (degenerative disc disease), lumbosacral 09/25/2012  . External hemorrhoid 09/25/2012  . Pain in joint, ankle and foot 08/10/2012  . Contracture of toe of left foot 08/10/2012  . Atherosclerosis of renal artery (HCC) 08/08/2012  . OP (osteoporosis) 08/08/2012  . Rhinitis, nonallergic, chronic 01/19/2012  . GERD (gastroesophageal reflux disease) 01/19/2012  . Adult hypothyroidism 11/14/2011  . Atypical mycobacterial infection of lung (HCC) 07/03/2011  . Lung nodule 07/03/2011  . Osteoarthritis of right hip 04/26/2011  . Aseptic necrosis (HCC) 01/27/2011  . COPD (chronic obstructive pulmonary disease) with chronic  bronchitis (HCC) 12/17/2010    Past Surgical History:  Procedure Laterality Date  . ANTERIOR AND POSTERIOR VAGINAL REPAIR    . CARDIAC CATHETERIZATION     6 years ago  . COLONOSCOPY    . ESOPHAGOGASTRODUODENOSCOPY    . EYE SURGERY Bilateral 09/2013   cataract surgery with lens implant  . THYROIDECTOMY     Left  . TOE AMPUTATION Left    2nd toe  . TONSILLECTOMY    . TOTAL HIP ARTHROPLASTY  04/24/2011   Procedure: TOTAL HIP ARTHROPLASTY;  Surgeon: Nestor LewandowskyFrank J Rowan, MD;  Location: [facility] OR;  Service: Orthopedics;  Laterality: Right;  DEPUY/PINNACLE CUP, SUMMIT BASIC STEM  . TOTAL HIP ARTHROPLASTY Left 04/20/2014   Procedure: TOTAL HIP ARTHROPLASTY;  Surgeon: Nestor LewandowskyFrank J Rowan, MD;  Location: [facility] OR;  Service: Orthopedics;  Laterality: Left;     OB History   None      Home Medications    Prior to Admission medications   Medication Sig Start Date End Date Taking? Authorizing Provider  acetaminophen (TYLENOL) 500 MG tablet Take 1,000 mg by mouth every 8 (eight) hours as needed.     [provider]  albuterol (PROVENTIL HFA;VENTOLIN HFA) 108 (90 Base) MCG/ACT inhaler Inhale 2 puffs into the lungs every 6 (six) hours as needed for wheezing or shortness of breath. 07/17/16   Jetty DuhamelYoung, Clinton D, MD  amLODipine (NORVASC) 5 MG tablet Take 1 tablet by mouth daily.  10/14/10   [provider]  aspirin EC 81 MG tablet Take 1 tablet by mouth daily.    [provider]  benzonatate (TESSALON) 200 MG capsule Take 1 capsule (200 mg total) 3 (three) times daily as needed by mouth for cough. 01/17/17   Jetty Duhamel D, MD  calcium carbonate (OS-CAL) 600 MG TABS Take 600 mg by mouth daily.    [provider]  DULoxetine (CYMBALTA) 60 MG capsule Take 60 mg by mouth 3 (three) times a week.     [provider]  famotidine (PEPCID) 40 MG tablet Take 1 tablet (40 mg total) daily by mouth. 01/17/17   Young, Joni Fears D, MD  ipratropium (ATROVENT) 0.06 % nasal spray Place 2 sprays  4 (four) times daily into the nose. If needed 01/17/17 07/31/19  Waymon Budge, MD  levothyroxine (SYNTHROID, LEVOTHROID) 50 MCG tablet Take 50 mcg by mouth daily before breakfast.    [provider]  metoprolol (TOPROL-XL) 50 MG 24 hr tablet Take 50 mg by mouth 2 (two) times daily.     [provider]  Multiple Vitamins-Minerals (ICAPS AREDS 2) CAPS Take 1 capsule by mouth 2 (two) times daily.    [provider]  umeclidinium bromide (INCRUSE ELLIPTA) 62.5 MCG/INH AEPB Inhale 1 puff into the lungs daily. 12/27/16   Waymon Budge, MD    Family History Family History  Problem Relation Age of Onset  . Emphysema Sister        smoker  . Allergies Brother   . Heart disease Brother   . Rheum arthritis Daughter   . Cancer Mother        GI  . Cancer Brother        throat  . Cancer Sister        unsure    Social History Social History   Tobacco Use  . Smoking status: Former Smoker    Packs/day: 2.00    Years: 26.00    Pack years: 52.00    Types: Cigarettes    Last attempt to quit: 03/04/1982    Years since quitting: 35.9  . Smokeless tobacco: Never Used  Substance Use Topics  . Alcohol use: Yes    Alcohol/week: 1.0 standard drinks    Types: 1 Shots of liquor per week  . Drug use: No     Allergies   Avelox [moxifloxacin hcl in nacl]; Biaxin [clarithromycin]; and Nsaids   Review of Systems Review of Systems  Musculoskeletal: Negative for arthralgias and myalgias.  Skin: Positive for wound.  Neurological: Negative for weakness and numbness.  All other systems reviewed and are negative.    Physical Exam Updated Vital Signs BP 112/82 (BP Location: Right Arm)   Pulse 75   Temp 98.6 F (37 C) (Oral)   Resp 18   Ht 5' (1.524 m)   Wt 47.6 kg   SpO2 95%   BMI 20.51 kg/m   Physical Exam  Constitutional: She is oriented to person, place, and time. She appears well-developed and well-nourished. No distress.  Mildly irritable  HENT:    Head: Normocephalic and atraumatic.  Eyes: Pupils are equal, round, and reactive to light. Conjunctivae are normal. Right eye exhibits no discharge. Left eye exhibits no discharge. No scleral icterus.  Neck: Normal range of motion.  Cardiovascular: Normal rate.  Pulmonary/Chest: Effort normal. No respiratory distress.  Abdominal: She exhibits no distension.  Musculoskeletal:  Left forearm: 8cm V shaped skin flap over the posterior mid forearm with  mild bruising and swelling. Bleeding is controlled  Neurological: She is alert and oriented to person, place, and time.  Skin: Skin is warm and dry.  Psychiatric: She has a normal mood and affect. Her behavior is normal.  Nursing note and vitals reviewed.    ED Treatments / Results  Labs (all labs ordered are listed, but only abnormal results are displayed) Labs Reviewed - No data to display  EKG None  Radiology No results found.  Procedures .Marland KitchenLaceration Repair Date/Time: 01/26/2018 7:13 PM Performed by: Bethel Born, PA-C Authorized by: Bethel Born, PA-C   Consent:    Consent obtained:  Verbal   Consent given by:  Patient   Risks discussed:  Infection, pain, poor cosmetic result and need for additional repair   Alternatives discussed:  No treatment Anesthesia (see MAR for exact dosages):    Anesthesia method:  None Laceration details:    Location:  Shoulder/arm   Shoulder/arm location:  L lower arm   Length (cm):  8   Depth (mm):  3 Repair type:    Repair type:  Simple Pre-procedure details:    Preparation:  Patient was prepped and draped in usual sterile fashion Exploration:    Hemostasis achieved with:  Direct pressure   Wound exploration: wound explored through full range of motion and entire depth of wound probed and visualized     Wound extent: no fascia violation noted, no muscle damage noted, no tendon damage noted and no vascular damage noted     Contaminated: no   Treatment:    Area cleansed  with:  Shur-Clens   Amount of cleaning:  Standard   Irrigation solution:  Sterile water   Irrigation volume:  30cc   Irrigation method:  Pressure wash   Visualized foreign bodies/material removed: no   Skin repair:    Repair method:  Tissue adhesive Approximation:    Approximation:  Loose Post-procedure details:    Dressing:  Non-adherent dressing   Patient tolerance of procedure:  Tolerated well, no immediate complications   (including critical care time)    Medications Ordered in ED Medications  Tdap (BOOSTRIX) 5-2.5-18.5 LF-MCG/0.5 injection (has no administration in time range)  Tdap (BOOSTRIX) injection 0.5 mL (0.5 mLs Intramuscular Given 01/26/18 1739)     Initial Impression / Assessment and Plan / ED Course  I have reviewed the triage vital signs and the nursing notes.  Pertinent labs & imaging results that were available during my care of the patient were reviewed by me and considered in my medical decision making (see chart for details).  84 year old female with left forearm laceration after bumping into a metal grabber just prior to arrival.  Laceration is a total of 8 cm.  Different types of repair were discussed with the patient.  She does not want stitches.  I think this is prudent because her skin is so thin.  Dermabond was applied and skin was loosely brought together.  A nonadherent dressing was applied over the Dermabond.  Patient was advised to have this rechecked by her doctor in a week.  She was offered imaging of her forearm but declined stating that it does not hurt and she does not want this done tonight.  Final Clinical Impressions(s) / ED Diagnoses   Final diagnoses:  Laceration of left forearm, initial encounter    ED Discharge Orders    None       Bethel Born, PA-C 01/26/18 1915    Sabas Sous, MD 01/27/18  0041  

## 2018-01-26 NOTE — ED Notes (Signed)
Tegaderm dressing applied to wound. Extra given to pt with instructions.

## 2018-01-26 NOTE — Discharge Instructions (Signed)
Please have wound rechecked next week by your doctor Return if you are worsening

## 2018-01-26 NOTE — ED Notes (Signed)
amb to BR w/o difficulty 

## 2018-02-03 ENCOUNTER — Ambulatory Visit: Payer: Self-pay | Admitting: Primary Care

## 2018-02-05 ENCOUNTER — Encounter: Payer: Self-pay | Admitting: Adult Health

## 2018-02-05 ENCOUNTER — Ambulatory Visit (INDEPENDENT_AMBULATORY_CARE_PROVIDER_SITE_OTHER): Payer: Medicare Other | Admitting: Adult Health

## 2018-02-05 DIAGNOSIS — M5137 Other intervertebral disc degeneration, lumbosacral region: Secondary | ICD-10-CM | POA: Diagnosis not present

## 2018-02-05 DIAGNOSIS — J449 Chronic obstructive pulmonary disease, unspecified: Secondary | ICD-10-CM

## 2018-02-05 DIAGNOSIS — J310 Chronic rhinitis: Secondary | ICD-10-CM | POA: Diagnosis not present

## 2018-02-05 NOTE — Progress Notes (Signed)
 @Patient  ID: Deanna JulySarah A [name], female    DOB: 28-Apr-1928, 84 y.o.   MRN: [id number]  Chief Complaint  Patient presents with  . Follow-up    COPD     Referring provider: Ardyth GalYbanez, Jane, MD  HPI: 84 year old female, former smoker, retired [employer], followed for COPD, [facility]ct[facility] from August 08, 2011 to March 26, 2012 Medical history significant for coronary artery disease, chronic kidney disease, glaucoma, hypertension  TEST/EVENTS :  CT chest May 2013 diffuse peribronchial vascular nodularity, bronchiectasis and scattered mucoid impaction no change in right lower nodular density indicative of a MAI  02/05/2018 Follow up : COPD , Bronchiectasis , MAI  Presents for a 1733-month follow-up.  Patient has underlying COPD and bronchiectasis.  Was previously treated for MAI in 2013 and 2014.  Says overall breathing is doing well.  She denies any flare of her cough or congestion.  Breathing is at baseline.  No increased shortness of breath.  She remains on Incruse daily.  Flu shot is up-to-date.  Pneumovax and Prevnar vaccines are up-to-date. Weight is steady . Does have nasal drainage intermittently , clear.  Lives alone at home . Drives .   Had a fall last month , went to ER . Neg CT head and sinus . Advised to discuss with PCP to see if PT/rehab to help with deconditioned.  She has Home health evaluation pending.  Lost her cane, wants rx for regular cane . Has balance issues walking . Falls easily . Has trouble standing from sitting position at times.    Allergies  Allergen Reactions  . Avelox [Moxifloxacin Hcl In Nacl] Other (See Comments)    Flushes face  . Biaxin [Clarithromycin]     Diarrhea and bad taste in mouth  . Nsaids     Does not take due to kidney disease    Immunization History  Administered Date(s) Administered  . Influenza Split 12/17/2010, 01/09/2012  . Influenza, High Dose Seasonal PF 01/15/2016, 01/15/2018  . Influenza,inj,Quad PF,6+ Mos  11/16/2012, 12/28/2013, 02/02/2015, 12/17/2016  . Influenza-Unspecified 11/30/2007, 12/17/2010, 01/09/2012, 11/16/2012, 12/03/2013, 12/28/2013, 12/03/2014, 02/02/2015, 01/15/2016, 12/17/2016  . Pneumococcal Conjugate-13 03/31/2014, 12/03/2014  . Pneumococcal Polysaccharide-23 01/01/2002, 01/01/2002, 12/17/2010  . Td 05/05/2015  . Tdap 05/05/2015, 01/26/2018    Past Medical History:  Diagnosis Date  . Abnormal heart rhythm    pt not sure what kind.  . Anemia   . Arthritis   . COPD (chronic obstructive pulmonary disease) (HCC)   . Dry skin   . GERD (gastroesophageal reflux disease)   . Hypertension   . Hypothyroidism   . Irritable bowel syndrome   . Kidney disease   . Pneumonia   . Shortness of breath   . Weight loss    10-12 lbs in 2 months (03/2011)    Tobacco History: Social History   Tobacco Use  Smoking Status Former Smoker  . Packs/day: 2.00  . Years: 26.00  . Pack years: 52.00  . Types: Cigarettes  . Last attempt to quit: 03/04/1982  . Years since quitting: 35.9  Smokeless Tobacco Never Used   Counseling given: Not Answered   Outpatient Medications Prior to Visit  Medication Sig Dispense Refill  . acetaminophen (TYLENOL) 500 MG tablet Take 1,000 mg by mouth every 8 (eight) hours as needed.     Marland Kitchen. albuterol (PROVENTIL HFA;VENTOLIN HFA) 108 (90 Base) MCG/ACT inhaler Inhale 2 puffs into the lungs every 6 (six) hours as needed for wheezing or shortness of breath. 3  Inhaler 3  . amLODipine (NORVASC) 5 MG tablet Take 1 tablet by mouth daily.    . benzonatate (TESSALON) 200 MG capsule Take 1 capsule (200 mg total) 3 (three) times daily as needed by mouth for cough. 30 capsule 4  . DULoxetine (CYMBALTA) 60 MG capsule Take 60 mg by mouth 3 (three) times a week.     . famotidine (PEPCID) 40 MG tablet Take 1 tablet (40 mg total) daily by mouth. 30 tablet 0  . ipratropium (ATROVENT) 0.06 % nasal spray Place 2 sprays 4 (four) times daily into the nose. If needed 45 mL 3  .  levothyroxine (SYNTHROID, LEVOTHROID) 50 MCG tablet Take 50 mcg by mouth daily before breakfast.    . metoprolol (TOPROL-XL) 50 MG 24 hr tablet Take 50 mg by mouth 2 (two) times daily.     . Multiple Vitamins-Minerals (ICAPS AREDS 2) CAPS Take 1 capsule by mouth 2 (two) times daily.    Marland Kitchen umeclidinium bromide (INCRUSE ELLIPTA) 62.5 MCG/INH AEPB Inhale 1 puff into the lungs daily. 90 each 3  . aspirin EC 81 MG tablet Take 1 tablet by mouth daily.    . calcium carbonate (OS-CAL) 600 MG TABS Take 600 mg by mouth daily.     No facility-administered medications prior to visit.      Review of Systems  Constitutional:   No  weight loss, night sweats,  Fevers, chills,  +fatigue, or  lassitude.  HEENT:   No headaches,  Difficulty swallowing,  Tooth/dental problems, or  Sore throat,                No sneezing, itching, ear ache,  +nasal congestion, post nasal drip,   CV:  No chest pain,  Orthopnea, PND, swelling in lower extremities, anasarca, dizziness, palpitations, syncope.   GI  No heartburn, indigestion, abdominal pain, nausea, vomiting, diarrhea, change in bowel habits, loss of appetite, bloody stools.   Resp:  No excess mucus, no productive cough,  No non-productive cough,  No coughing up of blood.  No change in color of mucus.  No wheezing.  No chest wall deformity  Skin: no rash or lesions.  GU: no dysuria, change in color of urine, no urgency or frequency.  No flank pain, no hematuria   MS:  +back pain    Physical Exam  BP 132/64 (BP Location: Right Arm, Cuff Size: Normal)   Pulse 69   Ht 4' 11.5" (1.511 m)   Wt 108 lb (49 kg)   SpO2 98%   BMI 21.45 kg/m   GEN: A/Ox3; pleasant , NAD, frail and elderly    HEENT:  [place]/AT,  EACs-clear, TMs-wnl, NOSE-clear, THROAT-clear, no lesions, no postnasal drip or exudate noted.   NECK:  Supple w/ fair ROM; no JVD; normal carotid impulses w/o bruits; no thyromegaly or nodules palpated; no lymphadenopathy.    RESP  Few trace rhonchi ,  no accessory muscle use, no dullness to percussion, kyphosis   CARD:  RRR, no m/r/g, no peripheral edema, pulses intact, no cyanosis or clubbing.  GI:   Soft & nt; nml bowel sounds; no organomegaly or masses detected.   Musco: Warm bil, no deformities or joint swelling noted.   Neuro: alert, no focal deficits noted.    Skin: Warm, no lesions or rashes    Lab Results:  CBC   BNP No results found for: BNP  ProBNP No results found for: PROBNP  Imaging:     No flowsheet data found.  No  results found for: NITRICOXIDE      Assessment & Plan:   COPD (chronic obstructive pulmonary disease) with chronic bronchitis Controlled on present regimen   Plan  Patient Instructions  Continue on Incruse 1 puff daily , rinse after use.  Activity as tolerated.  Follow up with Dr. Maple Hudson  In 6 months and As needed        Rhinitis, nonallergic, chronic Use saline rinses as needed.   DDD (degenerative disc disease), lumbosacral Balance issues, Kyphosis and deconditioning  Would benefit from PT . Discuss with PCP Order for cane per pt request.      Rubye Oaks, NP 02/05/2018

## 2018-02-05 NOTE — Assessment & Plan Note (Signed)
Balance issues, Kyphosis and deconditioning  Would benefit from PT . Discuss with PCP Order for cane per pt request.

## 2018-02-05 NOTE — Assessment & Plan Note (Signed)
Use saline rinses as needed.

## 2018-02-05 NOTE — Patient Instructions (Signed)
Continue on Incruse 1 puff daily , rinse after use.  Activity as tolerated.  Follow up with Dr. Maple HudsonYoung  In 6 months and As needed

## 2018-02-05 NOTE — Assessment & Plan Note (Signed)
Controlled on present regimen   Plan  Patient Instructions  Continue on Incruse 1 puff daily , rinse after use.  Activity as tolerated.  Follow up with Dr. Maple HudsonYoung  In 6 months and As needed

## 2018-02-15 ENCOUNTER — Emergency Department (HOSPITAL_BASED_OUTPATIENT_CLINIC_OR_DEPARTMENT_OTHER)
Admission: EM | Admit: 2018-02-15 | Discharge: 2018-02-15 | Disposition: A | Discharge status: Home / Self Care | Payer: Medicare Other | Attending: Emergency Medicine | Admitting: Emergency Medicine

## 2018-02-15 ENCOUNTER — Encounter (HOSPITAL_BASED_OUTPATIENT_CLINIC_OR_DEPARTMENT_OTHER): Payer: Self-pay | Admitting: Emergency Medicine

## 2018-02-15 ENCOUNTER — Other Ambulatory Visit: Payer: Self-pay

## 2018-02-15 DIAGNOSIS — S51802D Unspecified open wound of left forearm, subsequent encounter: Secondary | ICD-10-CM | POA: Insufficient documentation

## 2018-02-15 DIAGNOSIS — Z79899 Other long term (current) drug therapy: Secondary | ICD-10-CM | POA: Diagnosis not present

## 2018-02-15 DIAGNOSIS — Z96643 Presence of artificial hip joint, bilateral: Secondary | ICD-10-CM | POA: Insufficient documentation

## 2018-02-15 DIAGNOSIS — I10 Essential (primary) hypertension: Secondary | ICD-10-CM | POA: Diagnosis not present

## 2018-02-15 DIAGNOSIS — Z5189 Encounter for other specified aftercare: Secondary | ICD-10-CM

## 2018-02-15 DIAGNOSIS — Z89422 Acquired absence of other left toe(s): Secondary | ICD-10-CM | POA: Diagnosis not present

## 2018-02-15 DIAGNOSIS — X58XXXA Exposure to other specified factors, initial encounter: Secondary | ICD-10-CM | POA: Insufficient documentation

## 2018-02-15 DIAGNOSIS — J449 Chronic obstructive pulmonary disease, unspecified: Secondary | ICD-10-CM | POA: Diagnosis not present

## 2018-02-15 DIAGNOSIS — Z87891 Personal history of nicotine dependence: Secondary | ICD-10-CM | POA: Diagnosis not present

## 2018-02-15 DIAGNOSIS — Z4800 Encounter for change or removal of nonsurgical wound dressing: Secondary | ICD-10-CM | POA: Insufficient documentation

## 2018-02-15 DIAGNOSIS — Z7982 Long term (current) use of aspirin: Secondary | ICD-10-CM | POA: Insufficient documentation

## 2018-02-15 DIAGNOSIS — E039 Hypothyroidism, unspecified: Secondary | ICD-10-CM | POA: Diagnosis not present

## 2018-02-15 MED ORDER — BACITRACIN ZINC 500 UNIT/GM EX OINT
TOPICAL_OINTMENT | Freq: Every day | CUTANEOUS | Status: DC
  Administered 2018-02-15: 08:00:00 via TOPICAL

## 2018-02-15 NOTE — ED Provider Notes (Signed)
[facility] EMERGENCY DEPARTMENT Provider Note   CSN: [id number] Arrival date & time: 02/15/18  0645     History   Chief Complaint Chief Complaint  Patient presents with  . Dressing Change    HPI [name] is a 84 y.o. female.  Patient is a 84 year old female who presents for a dressing change on her skin tear.  She sustained a skin tear to her left forearm on November 26.  She has been followed by her primary care physician and says is been doing well but she came in today for dressing change and wound check.  She denies any drainage.  It is unclear who has been changing the dressings at home.     Past Medical History:  Diagnosis Date  . Abnormal heart rhythm    pt not sure what kind.  . Anemia   . Arthritis   . COPD (chronic obstructive pulmonary disease) (HCC)   . Dry skin   . GERD (gastroesophageal reflux disease)   . Hypertension   . Hypothyroidism   . Irritable bowel syndrome   . Kidney disease   . Pneumonia   . Shortness of breath   . Weight loss    10-12 lbs in 2 months (03/2011)    Patient Active Problem List   Diagnosis Date Noted  . Anxiety and depression 07/17/2017  . Chest pain of uncertain etiology 01/17/2017  . Anemia 07/21/2016  . Arthritis, hip 04/20/2014  . Can't get food down 02/01/2014  . H/O cataract extraction 09/02/2013  . Age-related macular degeneration, dry 08/03/2013  . Glaucoma suspect 08/03/2013  . DDD (degenerative disc disease), lumbosacral 09/25/2012  . External hemorrhoid 09/25/2012  . Pain in joint, ankle and foot 08/10/2012  . Contracture of toe of left foot 08/10/2012  . Atherosclerosis of renal artery (HCC) 08/08/2012  . OP (osteoporosis) 08/08/2012  . Rhinitis, nonallergic, chronic 01/19/2012  . GERD (gastroesophageal reflux disease) 01/19/2012  . Adult hypothyroidism 11/14/2011  . Atypical mycobacterial infection of lung (HCC) 07/03/2011  . Lung nodule 07/03/2011  . Osteoarthritis of right hip  04/26/2011  . Aseptic necrosis (HCC) 01/27/2011  . COPD (chronic obstructive pulmonary disease) with chronic bronchitis (HCC) 12/17/2010    Past Surgical History:  Procedure Laterality Date  . ANTERIOR AND POSTERIOR VAGINAL REPAIR    . CARDIAC CATHETERIZATION     6 years ago  . COLONOSCOPY    . ESOPHAGOGASTRODUODENOSCOPY    . EYE SURGERY Bilateral 09/2013   cataract surgery with lens implant  . THYROIDECTOMY     Left  . TOE AMPUTATION Left    2nd toe  . TONSILLECTOMY    . TOTAL HIP ARTHROPLASTY  04/24/2011   Procedure: TOTAL HIP ARTHROPLASTY;  Surgeon: Nestor Lewandowsky, MD;  Location: [facility] OR;  Service: Orthopedics;  Laterality: Right;  DEPUY/PINNACLE CUP, SUMMIT BASIC STEM  . TOTAL HIP ARTHROPLASTY Left 04/20/2014   Procedure: TOTAL HIP ARTHROPLASTY;  Surgeon: Nestor Lewandowsky, MD;  Location: [facility] OR;  Service: Orthopedics;  Laterality: Left;     OB History   No obstetric history on file.      Home Medications    Prior to Admission medications   Medication Sig Start Date End Date Taking? Authorizing Provider  acetaminophen (TYLENOL) 500 MG tablet Take 1,000 mg by mouth every 8 (eight) hours as needed.     [provider]  albuterol (PROVENTIL HFA;VENTOLIN HFA) 108 (90 Base) MCG/ACT inhaler Inhale 2 puffs into the lungs every 6 (six) hours  as needed for wheezing or shortness of breath. 07/17/16   Jetty Duhamel D, MD  amLODipine (NORVASC) 5 MG tablet Take 1 tablet by mouth daily. 10/14/10   [provider]  aspirin EC 81 MG tablet Take 1 tablet by mouth daily.    [provider]  benzonatate (TESSALON) 200 MG capsule Take 1 capsule (200 mg total) 3 (three) times daily as needed by mouth for cough. 01/17/17   Jetty Duhamel D, MD  calcium carbonate (OS-CAL) 600 MG TABS Take 600 mg by mouth daily.    [provider]  DULoxetine (CYMBALTA) 60 MG capsule Take 60 mg by mouth 3 (three) times a week.     [provider]  famotidine (PEPCID) 40 MG  tablet Take 1 tablet (40 mg total) daily by mouth. 01/17/17   Young, Joni Fears D, MD  ipratropium (ATROVENT) 0.06 % nasal spray Place 2 sprays 4 (four) times daily into the nose. If needed 01/17/17 07/31/19  Waymon Budge, MD  levothyroxine (SYNTHROID, LEVOTHROID) 50 MCG tablet Take 50 mcg by mouth daily before breakfast.    [provider]  metoprolol (TOPROL-XL) 50 MG 24 hr tablet Take 50 mg by mouth 2 (two) times daily.     [provider]  Multiple Vitamins-Minerals (ICAPS AREDS 2) CAPS Take 1 capsule by mouth 2 (two) times daily.    [provider]  umeclidinium bromide (INCRUSE ELLIPTA) 62.5 MCG/INH AEPB Inhale 1 puff into the lungs daily. 12/27/16   Waymon Budge, MD    Family History Family History  Problem Relation Age of Onset  . Emphysema Sister        smoker  . Allergies Brother   . Heart disease Brother   . Rheum arthritis Daughter   . Cancer Mother        GI  . Cancer Brother        throat  . Cancer Sister        unsure    Social History Social History   Tobacco Use  . Smoking status: Former Smoker    Packs/day: 2.00    Years: 26.00    Pack years: 52.00    Types: Cigarettes    Last attempt to quit: 03/04/1982    Years since quitting: 35.9  . Smokeless tobacco: Never Used  Substance Use Topics  . Alcohol use: Yes    Alcohol/week: 1.0 standard drinks    Types: 1 Shots of liquor per week  . Drug use: No     Allergies   Avelox [moxifloxacin hcl in nacl]; Biaxin [clarithromycin]; and Nsaids   Review of Systems Review of Systems  Constitutional: Negative for fever.  Gastrointestinal: Negative for nausea and vomiting.  Musculoskeletal: Negative for arthralgias, back pain, joint swelling and neck pain.  Skin: Positive for wound.  Neurological: Negative for weakness, numbness and headaches.     Physical Exam Updated Vital Signs BP 130/88   Pulse 79   Ht 4' 11.5" (1.511 m)   Wt 48.9 kg   SpO2 97%   BMI 21.41 kg/m    Physical Exam Constitutional:      Appearance: She is well-developed.  HENT:     Head: Normocephalic and atraumatic.  Neck:     Musculoskeletal: Normal range of motion and neck supple.  Cardiovascular:     Rate and Rhythm: Normal rate.  Pulmonary:     Effort: Pulmonary effort is normal.  Musculoskeletal:        General: No tenderness.  Comments: Patient has a healing skin tear to the volar surface of her left forearm.  It appears to be healing well without drainage or erythema.  No suggestions of infection.  Skin:    General: Skin is warm and dry.  Neurological:     Mental Status: She is alert and oriented to person, place, and time.      ED Treatments / Results  Labs (all labs ordered are listed, but only abnormal results are displayed) Labs Reviewed - No data to display  EKG None  Radiology No results found.  Procedures Procedures (including critical care time)  Medications Ordered in ED Medications  bacitracin ointment ( Topical Given 02/15/18 0738)     Initial Impression / Assessment and Plan / ED Course  I have reviewed the triage vital signs and the nursing notes.  Pertinent labs & imaging results that were available during my care of the patient were reviewed by me and considered in my medical decision making (see chart for details).     Patient's wound appears to be healing well.  New dressing was placed.  She was given wound care instructions and encouraged to follow-up with her primary care physician.  Final Clinical Impressions(s) / ED Diagnoses   Final diagnoses:  Visit for wound check    ED Discharge Orders    None       Rolan BuccoBelfi, Jaquavious Mercer, MD 02/15/18 [id number]

## 2018-02-15 NOTE — ED Triage Notes (Signed)
Pt was seen at primary care office on Friday for a skin tear on her left forearm. States she is here for a dressing change.

## 2018-03-30 ENCOUNTER — Emergency Department (HOSPITAL_BASED_OUTPATIENT_CLINIC_OR_DEPARTMENT_OTHER)
Admission: EM | Admit: 2018-03-30 | Discharge: 2018-03-30 | Disposition: A | Discharge status: Home / Self Care | Payer: Medicare Other | Attending: Emergency Medicine | Admitting: Emergency Medicine

## 2018-03-30 ENCOUNTER — Other Ambulatory Visit: Payer: Self-pay

## 2018-03-30 ENCOUNTER — Encounter (HOSPITAL_BASED_OUTPATIENT_CLINIC_OR_DEPARTMENT_OTHER): Payer: Self-pay | Admitting: Emergency Medicine

## 2018-03-30 DIAGNOSIS — L089 Local infection of the skin and subcutaneous tissue, unspecified: Secondary | ICD-10-CM

## 2018-03-30 DIAGNOSIS — Y939 Activity, unspecified: Secondary | ICD-10-CM | POA: Insufficient documentation

## 2018-03-30 DIAGNOSIS — W540XXA Bitten by dog, initial encounter: Secondary | ICD-10-CM | POA: Diagnosis not present

## 2018-03-30 DIAGNOSIS — Z79899 Other long term (current) drug therapy: Secondary | ICD-10-CM | POA: Diagnosis not present

## 2018-03-30 DIAGNOSIS — Z96643 Presence of artificial hip joint, bilateral: Secondary | ICD-10-CM | POA: Diagnosis not present

## 2018-03-30 DIAGNOSIS — Z87891 Personal history of nicotine dependence: Secondary | ICD-10-CM | POA: Diagnosis not present

## 2018-03-30 DIAGNOSIS — Y929 Unspecified place or not applicable: Secondary | ICD-10-CM | POA: Insufficient documentation

## 2018-03-30 DIAGNOSIS — I10 Essential (primary) hypertension: Secondary | ICD-10-CM | POA: Diagnosis not present

## 2018-03-30 DIAGNOSIS — J449 Chronic obstructive pulmonary disease, unspecified: Secondary | ICD-10-CM | POA: Diagnosis not present

## 2018-03-30 DIAGNOSIS — S8991XA Unspecified injury of right lower leg, initial encounter: Secondary | ICD-10-CM | POA: Diagnosis present

## 2018-03-30 DIAGNOSIS — E039 Hypothyroidism, unspecified: Secondary | ICD-10-CM | POA: Diagnosis not present

## 2018-03-30 DIAGNOSIS — L03115 Cellulitis of right lower limb: Secondary | ICD-10-CM | POA: Insufficient documentation

## 2018-03-30 DIAGNOSIS — T148XXA Other injury of unspecified body region, initial encounter: Secondary | ICD-10-CM

## 2018-03-30 DIAGNOSIS — Y999 Unspecified external cause status: Secondary | ICD-10-CM | POA: Insufficient documentation

## 2018-03-30 MED ORDER — CLINDAMYCIN HCL 300 MG PO CAPS: 300.0000 mg | ORAL_CAPSULE | Freq: Four times a day (QID) | ORAL | 0 refills | Status: AC

## 2018-03-30 MED ORDER — MUPIROCIN CALCIUM 2 % EX CREA: 1.0000 "application " | TOPICAL_CREAM | Freq: Two times a day (BID) | CUTANEOUS | 0 refills | Status: AC

## 2018-03-30 MED FILL — MUPIROCIN 2% CREAM: 2 | 10 days supply | Qty: 30 | Fill #0

## 2018-03-30 MED FILL — CLINDAMYCIN HCL 300 MG CAPS: 300 | 7 days supply | Qty: 28 | Fill #0

## 2018-03-30 NOTE — ED Triage Notes (Signed)
Dog bite 2 weeks ago.  Dog belongs to her son and immunizations are up to date. 4 cm scab on right lower lateral leg with 10cm x 5 cm area of redness.  Has not been seen medically for this bite.  Small puncture site just below right knee that is also scabbed.

## 2018-03-30 NOTE — ED Provider Notes (Signed)
[facility] EMERGENCY DEPARTMENT Provider Note   CSN: [id number] Arrival date & time: 03/30/18  0709     History   Chief Complaint Chief Complaint  Patient presents with  . Animal Bite    HPI LEYLA PEOPLES is a 85 y.o. female.  Patient is 85 year old female who presents with a possible wound infection.  She states that 10 days ago a dog jumped up on her and scratched her right leg.  She states it was not a bite but rather a scratch from his toenail.  She states over the last 2 to 3 days it is gotten more red around the area and there is been some swelling around her lower leg and ankle.  She states is been intermittently draining.  She denies any fevers.  She states her tetanus shot is up-to-date.     Past Medical History:  Diagnosis Date  . Abnormal heart rhythm    pt not sure what kind.  . Anemia   . Arthritis   . COPD (chronic obstructive pulmonary disease) (HCC)   . Dry skin   . GERD (gastroesophageal reflux disease)   . Hypertension   . Hypothyroidism   . Irritable bowel syndrome   . Kidney disease   . Pneumonia   . Shortness of breath   . Weight loss    10-12 lbs in 2 months (03/2011)    Patient Active Problem List   Diagnosis Date Noted  . Anxiety and depression 07/17/2017  . Chest pain of uncertain etiology 01/17/2017  . Anemia 07/21/2016  . Arthritis, hip 04/20/2014  . Can't get food down 02/01/2014  . H/O cataract extraction 09/02/2013  . Age-related macular degeneration, dry 08/03/2013  . Glaucoma suspect 08/03/2013  . DDD (degenerative disc disease), lumbosacral 09/25/2012  . External hemorrhoid 09/25/2012  . Pain in joint, ankle and foot 08/10/2012  . Contracture of toe of left foot 08/10/2012  . Atherosclerosis of renal artery (HCC) 08/08/2012  . OP (osteoporosis) 08/08/2012  . Rhinitis, nonallergic, chronic 01/19/2012  . GERD (gastroesophageal reflux disease) 01/19/2012  . Adult hypothyroidism 11/14/2011  . Atypical mycobacterial  infection of lung (HCC) 07/03/2011  . Lung nodule 07/03/2011  . Osteoarthritis of right hip 04/26/2011  . Aseptic necrosis (HCC) 01/27/2011  . COPD (chronic obstructive pulmonary disease) with chronic bronchitis (HCC) 12/17/2010    Past Surgical History:  Procedure Laterality Date  . ANTERIOR AND POSTERIOR VAGINAL REPAIR    . CARDIAC CATHETERIZATION     6 years ago  . COLONOSCOPY    . ESOPHAGOGASTRODUODENOSCOPY    . EYE SURGERY Bilateral 09/2013   cataract surgery with lens implant  . THYROIDECTOMY     Left  . TOE AMPUTATION Left    2nd toe  . TONSILLECTOMY    . TOTAL HIP ARTHROPLASTY  04/24/2011   Procedure: TOTAL HIP ARTHROPLASTY;  Surgeon: Nestor Lewandowsky, MD;  Location: [facility] OR;  Service: Orthopedics;  Laterality: Right;  DEPUY/PINNACLE CUP, SUMMIT BASIC STEM  . TOTAL HIP ARTHROPLASTY Left 04/20/2014   Procedure: TOTAL HIP ARTHROPLASTY;  Surgeon: Nestor Lewandowsky, MD;  Location: [facility] OR;  Service: Orthopedics;  Laterality: Left;     OB History   No obstetric history on file.      Home Medications    Prior to Admission medications   Medication Sig Start Date End Date Taking? Authorizing Provider  azelastine (ASTELIN) 0.1 % nasal spray Place into the nose. 09/11/17  Yes [provider]  acetaminophen (TYLENOL) 500 MG  tablet Take 1,000 mg by mouth every 8 (eight) hours as needed.     [provider]  albuterol (PROVENTIL HFA;VENTOLIN HFA) 108 (90 Base) MCG/ACT inhaler Inhale 2 puffs into the lungs every 6 (six) hours as needed for wheezing or shortness of breath. 07/17/16   Jetty DuhamelYoung, Clinton D, MD  amLODipine (NORVASC) 5 MG tablet Take 1 tablet by mouth daily. 10/14/10   [provider]  benzonatate (TESSALON) 200 MG capsule Take 1 capsule (200 mg total) 3 (three) times daily as needed by mouth for cough. 01/17/17   Jetty DuhamelYoung, Clinton D, MD  carboxymethylcellul-glycerin (OPTIVE) 0.5-0.9 % ophthalmic solution Apply to eye.    [provider]  clindamycin  (CLEOCIN) 300 MG capsule Take 1 capsule (300 mg total) by mouth 4 (four) times daily. X 7 days 03/30/18   Rolan BuccoBelfi, Krishon Adkison, MD  DULoxetine (CYMBALTA) 60 MG capsule Take 60 mg by mouth 3 (three) times a week.     [provider]  famotidine (PEPCID) 40 MG tablet Take 1 tablet (40 mg total) daily by mouth. 01/17/17   Young, Joni Fearslinton D, MD  ipratropium (ATROVENT) 0.06 % nasal spray Place 2 sprays 4 (four) times daily into the nose. If needed 01/17/17 07/31/19  Waymon BudgeYoung, Clinton D, MD  levothyroxine (SYNTHROID, LEVOTHROID) 50 MCG tablet Take 50 mcg by mouth daily before breakfast.    [provider]  metoprolol (TOPROL-XL) 50 MG 24 hr tablet Take 50 mg by mouth 2 (two) times daily.     [provider]  mupirocin cream (BACTROBAN) 2 % Apply 1 application topically 2 (two) times daily. 03/30/18   Rolan BuccoBelfi, Aiyanna Awtrey, MD  umeclidinium bromide (INCRUSE ELLIPTA) 62.5 MCG/INH AEPB Inhale 1 puff into the lungs daily. 12/27/16   Waymon BudgeYoung, Clinton D, MD    Family History Family History  Problem Relation Age of Onset  . Emphysema Sister        smoker  . Allergies Brother   . Heart disease Brother   . Rheum arthritis Daughter   . Cancer Mother        GI  . Cancer Brother        throat  . Cancer Sister        unsure    Social History Social History   Tobacco Use  . Smoking status: Former Smoker    Packs/day: 2.00    Years: 26.00    Pack years: 52.00    Types: Cigarettes    Last attempt to quit: 03/04/1982    Years since quitting: 36.0  . Smokeless tobacco: Never Used  Substance Use Topics  . Alcohol use: Yes    Alcohol/week: 1.0 standard drinks    Types: 1 Shots of liquor per week  . Drug use: No     Allergies   Avelox [moxifloxacin hcl in nacl]; Biaxin [clarithromycin]; and Nsaids   Review of Systems Review of Systems  Constitutional: Negative for fever.  Gastrointestinal: Negative for nausea and vomiting.  Musculoskeletal: Positive for joint swelling. Negative for  arthralgias, back pain and neck pain.  Skin: Positive for color change and wound.  Neurological: Negative for weakness, numbness and headaches.     Physical Exam Updated Vital Signs BP (!) 159/87 (BP Location: Right Arm)   Pulse 74   Temp 98.1 F (36.7 C) (Oral)   Resp 20   Wt 46.3 kg   SpO2 98%   BMI 20.26 kg/m   Physical Exam Constitutional:      Appearance: She is well-developed.  HENT:  Head: Normocephalic and atraumatic.  Neck:     Musculoskeletal: Normal range of motion and neck supple.  Cardiovascular:     Rate and Rhythm: Normal rate.  Pulmonary:     Effort: Pulmonary effort is normal.  Musculoskeletal:        General: Tenderness present.     Comments: Patient has some mild swelling to her right lower leg and around the ankle.  There is no joint effusion.  There is a wound to her lower leg that has scabbed over.  There is no drainage.  There is no underlying fluctuance or induration.  There is a mild amount of surrounding erythema that extends toward her lower leg.  Neurovascular is intact distally.  Skin:    General: Skin is warm and dry.  Neurological:     Mental Status: She is alert and oriented to person, place, and time.        ED Treatments / Results  Labs (all labs ordered are listed, but only abnormal results are displayed) Labs Reviewed - No data to display  EKG None  Radiology No results found.  Procedures Procedures (including critical care time)  Medications Ordered in ED Medications - No data to display   Initial Impression / Assessment and Plan / ED Course  I have reviewed the triage vital signs and the nursing notes.  Pertinent labs & imaging results that were available during my care of the patient were reviewed by me and considered in my medical decision making (see chart for details).     Patient with a healing wound to her right lower leg that now appears to have a small amount of cellulitis.  I do not feel any  underlying abscess.  She was discharged home in good condition.  Her tetanus shot is up-to-date.  She was advised in elevation of the extremity.  She was started on clindamycin and Bactroban ointment.  She was encouraged to follow-up with her PCP for recheck or return here as needed if she has any worsening symptoms.  Final Clinical Impressions(s) / ED Diagnoses   Final diagnoses:  Wound infection    ED Discharge Orders         Ordered    clindamycin (CLEOCIN) 300 MG capsule  4 times daily     03/30/18 0748    mupirocin cream (BACTROBAN) 2 %  2 times daily     03/30/18 [id number]           Rolan BuccoBelfi, Yameli Delamater, MD 03/30/18 [phone]

## 2018-05-14 ENCOUNTER — Other Ambulatory Visit: Payer: Self-pay

## 2018-05-14 ENCOUNTER — Emergency Department (HOSPITAL_BASED_OUTPATIENT_CLINIC_OR_DEPARTMENT_OTHER): Payer: Medicare Other

## 2018-05-14 ENCOUNTER — Emergency Department (HOSPITAL_BASED_OUTPATIENT_CLINIC_OR_DEPARTMENT_OTHER)
Admission: EM | Admit: 2018-05-14 | Discharge: 2018-05-14 | Disposition: A | Discharge status: Home / Self Care | Payer: Medicare Other | Attending: Emergency Medicine | Admitting: Emergency Medicine

## 2018-05-14 ENCOUNTER — Encounter (HOSPITAL_BASED_OUTPATIENT_CLINIC_OR_DEPARTMENT_OTHER): Payer: Self-pay

## 2018-05-14 DIAGNOSIS — W1839XA Other fall on same level, initial encounter: Secondary | ICD-10-CM | POA: Insufficient documentation

## 2018-05-14 DIAGNOSIS — J449 Chronic obstructive pulmonary disease, unspecified: Secondary | ICD-10-CM | POA: Insufficient documentation

## 2018-05-14 DIAGNOSIS — Y9389 Activity, other specified: Secondary | ICD-10-CM | POA: Diagnosis not present

## 2018-05-14 DIAGNOSIS — S098XXA Other specified injuries of head, initial encounter: Secondary | ICD-10-CM | POA: Diagnosis present

## 2018-05-14 DIAGNOSIS — Y999 Unspecified external cause status: Secondary | ICD-10-CM | POA: Diagnosis not present

## 2018-05-14 DIAGNOSIS — Z87891 Personal history of nicotine dependence: Secondary | ICD-10-CM | POA: Insufficient documentation

## 2018-05-14 DIAGNOSIS — I10 Essential (primary) hypertension: Secondary | ICD-10-CM | POA: Diagnosis not present

## 2018-05-14 DIAGNOSIS — W19XXXA Unspecified fall, initial encounter: Secondary | ICD-10-CM

## 2018-05-14 DIAGNOSIS — E039 Hypothyroidism, unspecified: Secondary | ICD-10-CM | POA: Diagnosis not present

## 2018-05-14 DIAGNOSIS — Y92009 Unspecified place in unspecified non-institutional (private) residence as the place of occurrence of the external cause: Secondary | ICD-10-CM | POA: Insufficient documentation

## 2018-05-14 DIAGNOSIS — S40212A Abrasion of left shoulder, initial encounter: Secondary | ICD-10-CM | POA: Insufficient documentation

## 2018-05-14 DIAGNOSIS — Z96642 Presence of left artificial hip joint: Secondary | ICD-10-CM | POA: Insufficient documentation

## 2018-05-14 DIAGNOSIS — S0101XA Laceration without foreign body of scalp, initial encounter: Secondary | ICD-10-CM | POA: Insufficient documentation

## 2018-05-14 DIAGNOSIS — Z79899 Other long term (current) drug therapy: Secondary | ICD-10-CM | POA: Diagnosis not present

## 2018-05-14 DIAGNOSIS — T148XXA Other injury of unspecified body region, initial encounter: Secondary | ICD-10-CM

## 2018-05-14 MED ORDER — LIDOCAINE-EPINEPHRINE (PF) 2 %-1:200000 IJ SOLN
10.0000 mL | Freq: Once | INTRAMUSCULAR | Status: DC
  Filled 2018-05-14 (×2): qty 10

## 2018-05-14 MED ORDER — BACITRACIN ZINC 500 UNIT/GM EX OINT: 1.0000 "application " | TOPICAL_OINTMENT | Freq: Two times a day (BID) | CUTANEOUS | 0 refills | Status: AC

## 2018-05-14 NOTE — ED Notes (Signed)
Pt ambulatory out of the dept without difficulty. Reports that she does not need her VS rechecked. Pt verbalizes understanding of when to have staples removed and how to care for them.

## 2018-05-14 NOTE — ED Provider Notes (Signed)
[facility] EMERGENCY DEPARTMENT Provider Note   CSN: [id number] Arrival date & time: 05/14/18  2114    History   Chief Complaint Chief Complaint  Patient presents with  . Fall    HPI [name] is a 85 y.o. female.     HPI   85 yo F with PMHx as below here with fall, head injury. Pt was trying to push her dog away this AM when she fell backwards after losing her balance. Struck her left posterior head. No LOC. Has had mild HA around that area since then, as well as mild L posterior shoulder pain, worse w/ movement and palpation. No alleviating factors. Sustained small head lac to head. Bleeding controlled. No AMS, LOC, nausea, vomiting. No neck pain. No numbness or weakness. Tetaus is UTD. No other complaints.  Past Medical History:  Diagnosis Date  . Abnormal heart rhythm    pt not sure what kind.  . Anemia   . Arthritis   . COPD (chronic obstructive pulmonary disease) (HCC)   . Dry skin   . GERD (gastroesophageal reflux disease)   . Hypertension   . Hypothyroidism   . Irritable bowel syndrome   . Kidney disease   . Pneumonia   . Shortness of breath   . Weight loss    10-12 lbs in 2 months (03/2011)    Patient Active Problem List   Diagnosis Date Noted  . Anxiety and depression 07/17/2017  . Chest pain of uncertain etiology 01/17/2017  . Anemia 07/21/2016  . Arthritis, hip 04/20/2014  . Can't get food down 02/01/2014  . H/O cataract extraction 09/02/2013  . Age-related macular degeneration, dry 08/03/2013  . Glaucoma suspect 08/03/2013  . DDD (degenerative disc disease), lumbosacral 09/25/2012  . External hemorrhoid 09/25/2012  . Pain in joint, ankle and foot 08/10/2012  . Contracture of toe of left foot 08/10/2012  . Atherosclerosis of renal artery (HCC) 08/08/2012  . OP (osteoporosis) 08/08/2012  . Rhinitis, nonallergic, chronic 01/19/2012  . GERD (gastroesophageal reflux disease) 01/19/2012  . Adult hypothyroidism 11/14/2011  . Atypical  mycobacterial infection of lung (HCC) 07/03/2011  . Lung nodule 07/03/2011  . Osteoarthritis of right hip 04/26/2011  . Aseptic necrosis (HCC) 01/27/2011  . COPD (chronic obstructive pulmonary disease) with chronic bronchitis (HCC) 12/17/2010    Past Surgical History:  Procedure Laterality Date  . ANTERIOR AND POSTERIOR VAGINAL REPAIR    . CARDIAC CATHETERIZATION     6 years ago  . COLONOSCOPY    . ESOPHAGOGASTRODUODENOSCOPY    . EYE SURGERY Bilateral 09/2013   cataract surgery with lens implant  . THYROIDECTOMY     Left  . TOE AMPUTATION Left    2nd toe  . TONSILLECTOMY    . TOTAL HIP ARTHROPLASTY  04/24/2011   Procedure: TOTAL HIP ARTHROPLASTY;  Surgeon: Nestor Lewandowsky, MD;  Location: [facility] OR;  Service: Orthopedics;  Laterality: Right;  DEPUY/PINNACLE CUP, SUMMIT BASIC STEM  . TOTAL HIP ARTHROPLASTY Left 04/20/2014   Procedure: TOTAL HIP ARTHROPLASTY;  Surgeon: Nestor Lewandowsky, MD;  Location: [facility] OR;  Service: Orthopedics;  Laterality: Left;     OB History   No obstetric history on file.      Home Medications    Prior to Admission medications   Medication Sig Start Date End Date Taking? Authorizing Provider  acetaminophen (TYLENOL) 500 MG tablet Take 1,000 mg by mouth every 8 (eight) hours as needed.     [provider]  albuterol (PROVENTIL  HFA;VENTOLIN HFA) 108 (90 Base) MCG/ACT inhaler Inhale 2 puffs into the lungs every 6 (six) hours as needed for wheezing or shortness of breath. 07/17/16   Jetty Duhamel D, MD  amLODipine (NORVASC) 5 MG tablet Take 1 tablet by mouth daily. 10/14/10   [provider]  azelastine (ASTELIN) 0.1 % nasal spray Place into the nose. 09/11/17   [provider]  bacitracin ointment Apply 1 application topically 2 (two) times daily for 10 days. 05/14/18 05/24/18  Shaune Pollack, MD  benzonatate (TESSALON) 200 MG capsule Take 1 capsule (200 mg total) 3 (three) times daily as needed by mouth for cough. 01/17/17   Jetty Duhamel D,  MD  carboxymethylcellul-glycerin (OPTIVE) 0.5-0.9 % ophthalmic solution Apply to eye.    [provider]  clindamycin (CLEOCIN) 300 MG capsule Take 1 capsule (300 mg total) by mouth 4 (four) times daily. X 7 days 03/30/18   Rolan Bucco, MD  DULoxetine (CYMBALTA) 60 MG capsule Take 60 mg by mouth 3 (three) times a week.     [provider]  famotidine (PEPCID) 40 MG tablet Take 1 tablet (40 mg total) daily by mouth. 01/17/17   Young, Joni Fears D, MD  ipratropium (ATROVENT) 0.06 % nasal spray Place 2 sprays 4 (four) times daily into the nose. If needed 01/17/17 07/31/19  Waymon Budge, MD  levothyroxine (SYNTHROID, LEVOTHROID) 50 MCG tablet Take 50 mcg by mouth daily before breakfast.    [provider]  metoprolol (TOPROL-XL) 50 MG 24 hr tablet Take 50 mg by mouth 2 (two) times daily.     [provider]  mupirocin cream (BACTROBAN) 2 % Apply 1 application topically 2 (two) times daily. 03/30/18   Rolan Bucco, MD  umeclidinium bromide (INCRUSE ELLIPTA) 62.5 MCG/INH AEPB Inhale 1 puff into the lungs daily. 12/27/16   Waymon Budge, MD    Family History Family History  Problem Relation Age of Onset  . Emphysema Sister        smoker  . Allergies Brother   . Heart disease Brother   . Rheum arthritis Daughter   . Cancer Mother        GI  . Cancer Brother        throat  . Cancer Sister        unsure    Social History Social History   Tobacco Use  . Smoking status: Former Smoker    Packs/day: 2.00    Years: 26.00    Pack years: 52.00    Types: Cigarettes    Last attempt to quit: 03/04/1982    Years since quitting: 36.2  . Smokeless tobacco: Never Used  Substance Use Topics  . Alcohol use: Yes  . Drug use: No     Allergies   Avelox [moxifloxacin hcl in nacl]; Biaxin [clarithromycin]; and Nsaids   Review of Systems Review of Systems  Constitutional: Positive for fatigue. Negative for chills and fever.  HENT: Negative for congestion  and rhinorrhea.   Eyes: Negative for visual disturbance.  Respiratory: Negative for cough, shortness of breath and wheezing.   Cardiovascular: Negative for chest pain and leg swelling.  Gastrointestinal: Negative for abdominal pain, diarrhea, nausea and vomiting.  Genitourinary: Negative for dysuria and flank pain.  Musculoskeletal: Negative for neck pain and neck stiffness.  Skin: Positive for wound. Negative for rash.  Allergic/Immunologic: Negative for immunocompromised state.  Neurological: Positive for headaches. Negative for syncope and weakness.  All other systems reviewed and are negative.  Physical Exam Updated Vital Signs BP (!) 130/97 (BP Location: Left Arm)   Pulse 72   Temp (!) 97.5 F (36.4 C) (Oral)   Resp 16   Ht 5' (1.524 m)   Wt 45.4 kg   SpO2 98%   BMI 19.53 kg/m   Physical Exam Vitals signs and nursing note reviewed.  Constitutional:      General: She is not in acute distress.    Appearance: She is well-developed.  HENT:     Head: Normocephalic and atraumatic.     Comments: Approx 1 cm linear lac to posterior scalp, no deformity or bruising. No postauricular bleeding. No periorbital ecchymoses. No hemotympanum. Eyes:     Conjunctiva/sclera: Conjunctivae normal.  Neck:     Musculoskeletal: Neck supple.  Cardiovascular:     Rate and Rhythm: Normal rate and regular rhythm.     Heart sounds: Normal heart sounds. No murmur. No friction rub.  Pulmonary:     Effort: Pulmonary effort is normal. No respiratory distress.     Breath sounds: Normal breath sounds. No wheezing or rales.  Abdominal:     General: There is no distension.     Palpations: Abdomen is soft.     Tenderness: There is no abdominal tenderness.  Musculoskeletal:     Comments: Superficial abrasion L posterior shoulder. Painless ROM.  Skin:    General: Skin is warm.     Capillary Refill: Capillary refill takes less than 2 seconds.  Neurological:     Mental Status: She is alert and  oriented to person, place, and time.     Motor: No abnormal muscle tone.      ED Treatments / Results  Labs (all labs ordered are listed, but only abnormal results are displayed) Labs Reviewed - No data to display  EKG None  Radiology Ct Head Wo Contrast  Result Date: 05/14/2018 CLINICAL DATA:  Patient fell, striking left side and back of head. No loss of consciousness. Slight frontal headache. No blood thinners. EXAM: CT HEAD WITHOUT CONTRAST TECHNIQUE: Contiguous axial images were obtained from the base of the skull through the vertex without intravenous contrast. COMPARISON:  01/06/2018 FINDINGS: Brain: Diffuse cerebral atrophy. Ventricular dilatation likely due to central atrophy. Low-attenuation changes in the deep white matter consistent small vessel ischemia. No mass-effect or midline shift. No abnormal extra-axial fluid collections. Gray-white matter junctions are distinct. Basal cisterns are not effaced. No acute intracranial hemorrhage. Vascular: Moderate intracranial arterial vascular calcifications. Skull: Calvarium appears intact. No acute depressed skull fractures. Sinuses/Orbits: Paranasal sinuses and mastoid air cells are clear. Other: Small subcutaneous scalp hematoma and laceration over the left posterior parietal region. IMPRESSION: No acute intracranial abnormalities. Chronic atrophy and small vessel ischemic changes. Electronically Signed   By: Burman Nieves M.D.   On: 05/14/2018 22:23    Procedures .Marland KitchenLaceration Repair Date/Time: 05/15/2018 2:28 AM Performed by: Shaune Pollack, MD Authorized by: Shaune Pollack, MD   Consent:    Consent obtained:  Verbal   Consent given by:  Patient   Risks discussed:  Infection, need for additional repair, pain, tendon damage, retained foreign body, vascular damage, poor cosmetic result, poor wound healing and nerve damage   Alternatives discussed:  Referral and delayed treatment Anesthesia (see MAR for exact dosages):     Anesthesia method:  Local infiltration   Local anesthetic:  Lidocaine 1% WITH epi Laceration details:    Location:  Scalp   Scalp location:  L parietal   Length (cm):  3 Repair type:  Repair type:  Simple Pre-procedure details:    Preparation:  Patient was prepped and draped in usual sterile fashion and imaging obtained to evaluate for foreign bodies Exploration:    Hemostasis achieved with:  Direct pressure   Wound exploration: wound explored through full range of motion and entire depth of wound probed and visualized   Treatment:    Area cleansed with:  Betadine   Amount of cleaning:  Extensive   Irrigation solution:  Sterile water   Irrigation method:  Pressure wash Skin repair:    Repair method:  Staples   Number of staples:  6 Approximation:    Approximation:  Close Post-procedure details:    Dressing:  Antibiotic ointment   Patient tolerance of procedure:  Tolerated well, no immediate complications   (including critical care time)  Medications Ordered in ED Medications  lidocaine-EPINEPHrine (XYLOCAINE W/EPI) 2 %-1:200000 (PF) injection 10 mL (has no administration in time range)     Initial Impression / Assessment and Plan / ED Course  I have reviewed the triage vital signs and the nursing notes.  Pertinent labs & imaging results that were available during my care of the patient were reviewed by me and considered in my medical decision making (see chart for details).        [age] yo F here w/ superficial scalp lac after mechanical fall. Was in usual state of health prior to the fall. CT head reviewed by myself, no acute abnormality. No C-Spine TTP on history or exam, no UE weakness, numbness, or signs of occult C-Spine or central cord injury. She has a superficial abrasion to L shoulder but no bony TTP. OFfered plain films of shoulder but pt refuses, requesting d/c. Lac repaired. Tetanus UTD. Will d/c with good return precautions. Family at bedside, will take pt home  and monitor. No anticoagulant use.  Final Clinical Impressions(s) / ED Diagnoses   Final diagnoses:  Laceration of scalp, initial encounter  Abrasion  Fall, initial encounter    ED Discharge Orders         Ordered    bacitracin ointment  2 times daily     05/14/18 2339           Shaune PollackIsaacs, Krystle Polcyn, MD 05/15/18 [phone]

## 2018-05-14 NOTE — Discharge Instructions (Addendum)
It is OK to shower, but do not swim or submerge your head underwater  Apply antibiotic ointment to the wound twice a day   Follow-up in 10-14 days for staple removal  REturn if you have worsening bleeding, new areas of pain, redness around the wound, or other concerning symptoms

## 2018-05-14 NOTE — ED Triage Notes (Addendum)
Pt states she fell against a wall in her home just PTA-lac to back of head-denies LOC-pt with steady gait with own cane-also c/o pain to left buttock and left shoulder-NAD

## 2018-05-26 ENCOUNTER — Emergency Department (HOSPITAL_BASED_OUTPATIENT_CLINIC_OR_DEPARTMENT_OTHER)
Admission: EM | Admit: 2018-05-26 | Discharge: 2018-05-26 | Disposition: A | Discharge status: Home / Self Care | Payer: Medicare Other | Attending: Emergency Medicine | Admitting: Emergency Medicine

## 2018-05-26 ENCOUNTER — Other Ambulatory Visit: Payer: Self-pay

## 2018-05-26 ENCOUNTER — Encounter (HOSPITAL_BASED_OUTPATIENT_CLINIC_OR_DEPARTMENT_OTHER): Payer: Self-pay | Admitting: Emergency Medicine

## 2018-05-26 DIAGNOSIS — J449 Chronic obstructive pulmonary disease, unspecified: Secondary | ICD-10-CM | POA: Diagnosis not present

## 2018-05-26 DIAGNOSIS — Y929 Unspecified place or not applicable: Secondary | ICD-10-CM | POA: Diagnosis not present

## 2018-05-26 DIAGNOSIS — Z4802 Encounter for removal of sutures: Secondary | ICD-10-CM | POA: Diagnosis present

## 2018-05-26 DIAGNOSIS — E039 Hypothyroidism, unspecified: Secondary | ICD-10-CM | POA: Diagnosis not present

## 2018-05-26 DIAGNOSIS — S0101XD Laceration without foreign body of scalp, subsequent encounter: Secondary | ICD-10-CM | POA: Insufficient documentation

## 2018-05-26 DIAGNOSIS — X58XXXD Exposure to other specified factors, subsequent encounter: Secondary | ICD-10-CM | POA: Insufficient documentation

## 2018-05-26 DIAGNOSIS — Z87891 Personal history of nicotine dependence: Secondary | ICD-10-CM | POA: Insufficient documentation

## 2018-05-26 DIAGNOSIS — Y999 Unspecified external cause status: Secondary | ICD-10-CM | POA: Insufficient documentation

## 2018-05-26 DIAGNOSIS — I10 Essential (primary) hypertension: Secondary | ICD-10-CM | POA: Diagnosis not present

## 2018-05-26 DIAGNOSIS — Y939 Activity, unspecified: Secondary | ICD-10-CM | POA: Diagnosis not present

## 2018-05-26 DIAGNOSIS — Z79899 Other long term (current) drug therapy: Secondary | ICD-10-CM | POA: Diagnosis not present

## 2018-05-26 NOTE — ED Notes (Signed)
6 staples removed

## 2018-05-26 NOTE — ED Triage Notes (Signed)
Pt here for staple removal from the back of her head.

## 2018-05-26 NOTE — Discharge Instructions (Signed)
Your wound appears to be healing well. Continue to keep the area clean and dry.   May apply ointments or lotions such as Aquaphor to the area to reduce scarring. The key is to keep the skin well hydrated and supple. It is also important to stay well hydrated by drinking plenty of water. Keep your scar protected from the sun. Cover the scar with sunscreen that has an SPF (sun protection factor) of 30 or higher. Do not put sunscreen on your scar until it has healed. Gently massage the scar using a circular motion. This will help to minimize the appearance of the scar. Do this only after the incision has closed and all of the sutures have been removed. Remember that the scar may appear lighter or darker than your normal skin color. This difference in color should even out with time. If your scar does not fade or go away with time and you do not like how it looks, consider talking with a plastic surgeon or a dermatologist.  

## 2018-05-26 NOTE — ED Provider Notes (Signed)
[facility] EMERGENCY DEPARTMENT Provider Note   CSN: [id number] Arrival date & time: 05/26/18  1153    History   Chief Complaint Chief Complaint  Patient presents with  . Suture / Staple Removal    HPI [name] is a 85 y.o. female.     HPI   [name] is a 85 y.o. female, with a history of HTN, COPD, arthritis, presenting to the ED for staple removal from the scalp.  Staples were placed in the ED March 13. She denies pain, swelling, fever, exudate, dehiscence, headaches, syncope, neurologic deficits, subsequent falls or injuries, or any other complaints.  Past Medical History:  Diagnosis Date  . Abnormal heart rhythm    pt not sure what kind.  . Anemia   . Arthritis   . COPD (chronic obstructive pulmonary disease) (HCC)   . Dry skin   . GERD (gastroesophageal reflux disease)   . Hypertension   . Hypothyroidism   . Irritable bowel syndrome   . Kidney disease   . Pneumonia   . Shortness of breath   . Weight loss    10-12 lbs in 2 months (03/2011)    Patient Active Problem List   Diagnosis Date Noted  . Anxiety and depression 07/17/2017  . Chest pain of uncertain etiology 01/17/2017  . Anemia 07/21/2016  . Arthritis, hip 04/20/2014  . Can't get food down 02/01/2014  . H/O cataract extraction 09/02/2013  . Age-related macular degeneration, dry 08/03/2013  . Glaucoma suspect 08/03/2013  . DDD (degenerative disc disease), lumbosacral 09/25/2012  . External hemorrhoid 09/25/2012  . Pain in joint, ankle and foot 08/10/2012  . Contracture of toe of left foot 08/10/2012  . Atherosclerosis of renal artery (HCC) 08/08/2012  . OP (osteoporosis) 08/08/2012  . Rhinitis, nonallergic, chronic 01/19/2012  . GERD (gastroesophageal reflux disease) 01/19/2012  . Adult hypothyroidism 11/14/2011  . Atypical mycobacterial infection of lung (HCC) 07/03/2011  . Lung nodule 07/03/2011  . Osteoarthritis of right hip 04/26/2011  . Aseptic necrosis (HCC)  01/27/2011  . COPD (chronic obstructive pulmonary disease) with chronic bronchitis (HCC) 12/17/2010    Past Surgical History:  Procedure Laterality Date  . ANTERIOR AND POSTERIOR VAGINAL REPAIR    . CARDIAC CATHETERIZATION     6 years ago  . COLONOSCOPY    . ESOPHAGOGASTRODUODENOSCOPY    . EYE SURGERY Bilateral 09/2013   cataract surgery with lens implant  . THYROIDECTOMY     Left  . TOE AMPUTATION Left    2nd toe  . TONSILLECTOMY    . TOTAL HIP ARTHROPLASTY  04/24/2011   Procedure: TOTAL HIP ARTHROPLASTY;  Surgeon: Nestor Lewandowsky, MD;  Location: [facility] OR;  Service: Orthopedics;  Laterality: Right;  DEPUY/PINNACLE CUP, SUMMIT BASIC STEM  . TOTAL HIP ARTHROPLASTY Left 04/20/2014   Procedure: TOTAL HIP ARTHROPLASTY;  Surgeon: Nestor Lewandowsky, MD;  Location: [facility] OR;  Service: Orthopedics;  Laterality: Left;     OB History   No obstetric history on file.      Home Medications    Prior to Admission medications   Medication Sig Start Date End Date Taking? Authorizing Provider  acetaminophen (TYLENOL) 500 MG tablet Take 1,000 mg by mouth every 8 (eight) hours as needed.     [provider]  albuterol (PROVENTIL HFA;VENTOLIN HFA) 108 (90 Base) MCG/ACT inhaler Inhale 2 puffs into the lungs every 6 (six) hours as needed for wheezing or shortness of breath. 07/17/16   Waymon Budge, MD  amLODipine (NORVASC) 5 MG tablet Take 1 tablet by mouth daily. 10/14/10   [provider]  azelastine (ASTELIN) 0.1 % nasal spray Place into the nose. 09/11/17   [provider]  benzonatate (TESSALON) 200 MG capsule Take 1 capsule (200 mg total) 3 (three) times daily as needed by mouth for cough. 01/17/17   Jetty Duhamel D, MD  carboxymethylcellul-glycerin (OPTIVE) 0.5-0.9 % ophthalmic solution Apply to eye.    [provider]  clindamycin (CLEOCIN) 300 MG capsule Take 1 capsule (300 mg total) by mouth 4 (four) times daily. X 7 days 03/30/18   Rolan Bucco, MD  DULoxetine  (CYMBALTA) 60 MG capsule Take 60 mg by mouth 3 (three) times a week.     [provider]  famotidine (PEPCID) 40 MG tablet Take 1 tablet (40 mg total) daily by mouth. 01/17/17   Young, Joni Fears D, MD  ipratropium (ATROVENT) 0.06 % nasal spray Place 2 sprays 4 (four) times daily into the nose. If needed 01/17/17 07/31/19  Waymon Budge, MD  levothyroxine (SYNTHROID, LEVOTHROID) 50 MCG tablet Take 50 mcg by mouth daily before breakfast.    [provider]  metoprolol (TOPROL-XL) 50 MG 24 hr tablet Take 50 mg by mouth 2 (two) times daily.     [provider]  mupirocin cream (BACTROBAN) 2 % Apply 1 application topically 2 (two) times daily. 03/30/18   Rolan Bucco, MD  umeclidinium bromide (INCRUSE ELLIPTA) 62.5 MCG/INH AEPB Inhale 1 puff into the lungs daily. 12/27/16   Waymon Budge, MD    Family History Family History  Problem Relation Age of Onset  . Emphysema Sister        smoker  . Allergies Brother   . Heart disease Brother   . Rheum arthritis Daughter   . Cancer Mother        GI  . Cancer Brother        throat  . Cancer Sister        unsure    Social History Social History   Tobacco Use  . Smoking status: Former Smoker    Packs/day: 2.00    Years: 26.00    Pack years: 52.00    Types: Cigarettes    Last attempt to quit: 03/04/1982    Years since quitting: 36.2  . Smokeless tobacco: Never Used  Substance Use Topics  . Alcohol use: Yes  . Drug use: No     Allergies   Avelox [moxifloxacin hcl in nacl]; Biaxin [clarithromycin]; and Nsaids   Review of Systems Review of Systems  Constitutional: Negative for fever.  Skin: Positive for wound.  Neurological: Negative for dizziness, syncope, weakness, numbness and headaches.     Physical Exam Updated Vital Signs BP 137/84 (BP Location: Left Arm)   Pulse 75   Temp (!) 97.5 F (36.4 C) (Oral)   Resp 16   Ht 5' (1.524 m)   Wt 45 kg   SpO2 97%   BMI 19.38 kg/m   Physical Exam  Vitals signs and nursing note reviewed.  Constitutional:      General: She is not in acute distress.    Appearance: She is well-developed. She is not diaphoretic.  HENT:     Head: Normocephalic.     Comments: Well-healed, clean repaired laceration to the left upper occipital region of the scalp.  No dehiscence.  No tenderness, erythema, exudate, swelling. Eyes:     Conjunctiva/sclera: Conjunctivae normal.  Neck:     Musculoskeletal: Neck supple.  Cardiovascular:     Rate and Rhythm: Normal rate and regular rhythm.  Pulmonary:     Effort: Pulmonary effort is normal.  Skin:    General: Skin is warm and dry.     Coloration: Skin is not pale.  Neurological:     Mental Status: She is alert.  Psychiatric:        Behavior: Behavior normal.      ED Treatments / Results  Labs (all labs ordered are listed, but only abnormal results are displayed) Labs Reviewed - No data to display  EKG None  Radiology No results found.  Procedures .Suture Removal Date/Time: 05/26/2018 12:05 PM Performed by: Lou Miner, RN Authorized by: Anselm Pancoast, PA-C   Consent:    Consent obtained:  Verbal   Consent given by:  Patient   Risks discussed:  Bleeding, pain and wound separation Location:    Location:  Head/neck   Head/neck location:  Scalp Procedure details:    Wound appearance:  No signs of infection, good wound healing and clean   Number of staples removed:  6 Post-procedure details:    Post-removal:  No dressing applied   Patient tolerance of procedure:  Tolerated well, no immediate complications Comments:     Procedure note for staple placement was reviewed.  6 staples were placed.  6 staples were removed today.   (including critical care time)  Medications Ordered in ED Medications - No data to display   Initial Impression / Assessment and Plan / ED Course  I have reviewed the triage vital signs and the nursing notes.  Pertinent labs & imaging results that were  available during my care of the patient were reviewed by me and considered in my medical decision making (see chart for details).        She presents for staple removal from scalp wound.  Wound appears clean and well-healed.  No evidence of infection or dehiscence.  No evidence of head injury complications. The patient was given instructions for continued home care as well as return precautions. Patient voices understanding of these instructions, accepts the plan, and is comfortable with discharge.  Final Clinical Impressions(s) / ED Diagnoses   Final diagnoses:  Encounter for staple removal    ED Discharge Orders    None       Concepcion Living 05/26/18 1214    Alvira Monday, MD 05/27/18 [phone]

## 2018-07-06 ENCOUNTER — Encounter: Payer: Self-pay | Admitting: Internal Medicine

## 2018-07-06 ENCOUNTER — Ambulatory Visit (INDEPENDENT_AMBULATORY_CARE_PROVIDER_SITE_OTHER): Payer: Medicare Other | Admitting: Internal Medicine

## 2018-07-06 ENCOUNTER — Other Ambulatory Visit: Payer: Self-pay

## 2018-07-06 VITALS — BP 100/70 | HR 79 | Ht 60.0 in | Wt 98.8 lb

## 2018-07-06 DIAGNOSIS — A310 Pulmonary mycobacterial infection: Secondary | ICD-10-CM | POA: Diagnosis not present

## 2018-07-06 DIAGNOSIS — J449 Chronic obstructive pulmonary disease, unspecified: Secondary | ICD-10-CM | POA: Diagnosis not present

## 2018-07-06 MED ORDER — ALBUTEROL SULFATE HFA 108 (90 BASE) MCG/ACT IN AERS: 2.0000 | INHALATION_SPRAY | Freq: Four times a day (QID) | RESPIRATORY_TRACT | 12 refills | Status: AC | PRN

## 2018-07-06 MED ORDER — UMECLIDINIUM BROMIDE 62.5 MCG/INH IN AEPB: 1.0000 | INHALATION_SPRAY | Freq: Every day | RESPIRATORY_TRACT | 3 refills | Status: AC

## 2018-07-06 NOTE — Progress Notes (Signed)
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 Nodule, thyroid nodule, CAD. We started triple therapy with clarithromycin 250 mg x2, rifampin 300 mg x2 and Myambutol 400 mg x3, each taken 3 days per week beginning 08/08/2011/ end 03/26/12. Sputum AFB Cx + again for [facility] 11/28/14  ----------------------------------------------------------------------------------  07/17/2017- 85 year old female former smoker, retired [employer] fundraiser, followed for COPD, bronchiectasis/+MAIC, ? Nodule, Complicated by thyroid nodule, CAD, CKD, glaucoma, HBP O2 2L ----COPD: Pt notes recent falls-was checked out by PCP-no broken bones. Pt states her breathing has been doing well overall.  Incruse, albuterol hfa,  She comes in today asking for psychiatric referral for anxiety and depression-" someone who can prescribe".  Describes a number of situational stress issues related to contractual difficulties with assisted living.  She may mostly need a Child psychotherapist.  Refers to death of son-not recent. Breathing has been comfortable with no acute issues.  Minimal cough now, little sputum, no fever.  Some dyspnea on exertion only if more active than usual.  07/05/2018- 85 year old female former smoker, retired [employer] fundraiser, followed for COPD, bronchiectasis/+MAIC, ? Nodule, Complicated by thyroid nodule, CAD, CKD, glaucoma, HBP, peripheral edema O2 2L Fell in April> staples for scalp laceration.  -----SOB, on Incruse, states it works well for her.  Son here today Realizes her kyphosis crowds her breathing. Little cough or phlegm and no acute issue. More frequent falls- has seen her PCP. CXR 06/10/2018-  IMPRESSION: COPD.  No active disease. Stable small hiatal hernia.  Aortic atherosclerosis.  ROS-See HPI   + = positive  Constitutional:   No-   weight loss, night sweats, fevers, chills, fatigue, lassitude. HEENT:   No-  headaches, difficulty swallowing, tooth/dental problems, sore throat,       No-  sneezing,  itching, ear ache, nasal congestion, +post nasal drip,  CV:  chest pain, orthopnea, PND, swelling in lower extremities, anasarca, dizziness, palpitations Resp: +  shortness of breath with exertion, Not or at rest.           +productive  cough,  + non-productive cough,  No- coughing up of blood.            No change in color of mucus.  No- wheezing.   Skin:   No- rash or lesions. GI:  No-heartburn, indigestion, no-abdominal pain, nausea, vomiting, GU: . MS:  No-   joint pain or swelling.   Neuro-     nothing unusual Psych:  No- change in mood or affect. No depression or anxiety.  No memory loss.  OBJ General- Alert, Oriented, Affect-appropriate, Distress- none acute; petite elderly woman.  Skin- rash-none,excoriation- none.  Lymphadenopathy- none Head- atraumatic            Eyes- Gross vision intact, PERRLA, conjunctivae clear secretions            Ears- +hard of hearing            Nose- Clear, no-Septal dev, mucus, polyps, erosion, perforation             Throat- Mallampati II , mucosa clear , drainage- none  tonsils- atrophic Neck- flexible , trachea midline, no stridor , thyroid+ nodule R, carotid no bruit Chest - symmetrical excursion , unlabored           Heart/CV- RRR , murmur +1/6 systolic , no gallop  , no rub, nl s1 s2                           -  JVD- none , edema+trace, stasis changes- none, varices- none           Lung- + few crackles scattered, unlabored, cough-none , dullness-none, rub- none           Chest wall-+ kyphosis and loss of vertical height Abd-  Br/ Gen/ Rectal- Not done, not indicated Extrem- cyanosis- none, clubbing, none, atrophy- none, strength- nl Neuro- grossly intact to observation

## 2018-07-06 NOTE — Patient Instructions (Signed)
Refill script for Incruse sent to [facility]  Refill script for Ventolin sent to [facility]  Please call if we can help

## 2018-07-19 NOTE — Assessment & Plan Note (Signed)
This has not been a progressive destructive process and seems clinically burned out currently.

## 2018-07-19 NOTE — Assessment & Plan Note (Signed)
She has stabilized in recent years, with minimal productive cough, clear CXR. Incruse and occasional albuterol hfa seem sufficient. Plan- refilled Incruse with med discussion

## 2018-07-20 ENCOUNTER — Emergency Department (HOSPITAL_BASED_OUTPATIENT_CLINIC_OR_DEPARTMENT_OTHER): Payer: Medicare Other

## 2018-07-20 ENCOUNTER — Encounter (HOSPITAL_BASED_OUTPATIENT_CLINIC_OR_DEPARTMENT_OTHER): Payer: Self-pay | Admitting: *Deleted

## 2018-07-20 ENCOUNTER — Emergency Department (HOSPITAL_BASED_OUTPATIENT_CLINIC_OR_DEPARTMENT_OTHER)
Admission: EM | Admit: 2018-07-20 | Discharge: 2018-07-20 | Disposition: A | Discharge status: Home / Self Care | Payer: Medicare Other | Attending: Emergency Medicine | Admitting: Emergency Medicine

## 2018-07-20 ENCOUNTER — Other Ambulatory Visit: Payer: Self-pay

## 2018-07-20 DIAGNOSIS — E039 Hypothyroidism, unspecified: Secondary | ICD-10-CM | POA: Diagnosis not present

## 2018-07-20 DIAGNOSIS — J449 Chronic obstructive pulmonary disease, unspecified: Secondary | ICD-10-CM | POA: Diagnosis not present

## 2018-07-20 DIAGNOSIS — Z87891 Personal history of nicotine dependence: Secondary | ICD-10-CM | POA: Diagnosis not present

## 2018-07-20 DIAGNOSIS — R1013 Epigastric pain: Secondary | ICD-10-CM | POA: Diagnosis not present

## 2018-07-20 DIAGNOSIS — Z96642 Presence of left artificial hip joint: Secondary | ICD-10-CM | POA: Diagnosis not present

## 2018-07-20 DIAGNOSIS — I10 Essential (primary) hypertension: Secondary | ICD-10-CM | POA: Diagnosis not present

## 2018-07-20 DIAGNOSIS — Z79899 Other long term (current) drug therapy: Secondary | ICD-10-CM | POA: Insufficient documentation

## 2018-07-20 DIAGNOSIS — R1084 Generalized abdominal pain: Secondary | ICD-10-CM

## 2018-07-20 LAB — COMPREHENSIVE METABOLIC PANEL
ALT: 15 U/L (ref 0–44)
AST: 22 U/L (ref 15–41)
Albumin: 3.8 g/dL (ref 3.5–5.0)
Alkaline Phosphatase: 98 U/L (ref 38–126)
Anion gap: 9 (ref 5–15)
BUN: 21 mg/dL (ref 8–23)
CO2: 28 mmol/L (ref 22–32)
Calcium: 9 mg/dL (ref 8.9–10.3)
Chloride: 101 mmol/L (ref 98–111)
Creatinine, Ser: 1.12 mg/dL — ABNORMAL HIGH (ref 0.44–1.00)
GFR calc Af Amer: 51 mL/min — ABNORMAL LOW (ref >60)
GFR calc non Af Amer: 44 mL/min — ABNORMAL LOW (ref >60)
Glucose, Bld: 98 mg/dL (ref 70–99)
Potassium: 4 mmol/L (ref 3.5–5.1)
Sodium: 138 mmol/L (ref 135–145)
Total Bilirubin: 0.7 mg/dL (ref 0.3–1.2)
Total Protein: 7.3 g/dL (ref 6.5–8.1)

## 2018-07-20 LAB — CBC WITH DIFFERENTIAL/PLATELET
Abs Immature Granulocytes: 0.02 10*3/uL (ref 0.00–0.07)
Basophils Absolute: 0.1 10*3/uL (ref 0.0–0.1)
Basophils Relative: 1 %
Eosinophils Absolute: 0.1 10*3/uL (ref 0.0–0.5)
Eosinophils Relative: 1 %
HCT: 35 % — ABNORMAL LOW (ref 36.0–46.0)
Hemoglobin: 10.9 g/dL — ABNORMAL LOW (ref 12.0–15.0)
Immature Granulocytes: 0 %
Lymphocytes Relative: 35 %
Lymphs Abs: 2.4 10*3/uL (ref 0.7–4.0)
MCH: 26.7 pg (ref 26.0–34.0)
MCHC: 31.1 g/dL (ref 30.0–36.0)
MCV: 85.8 fL (ref 80.0–100.0)
Monocytes Absolute: 0.5 10*3/uL (ref 0.1–1.0)
Monocytes Relative: 7 %
Neutro Abs: 4 10*3/uL (ref 1.7–7.7)
Neutrophils Relative %: 56 %
Platelets: 250 10*3/uL (ref 150–400)
RBC: 4.08 MIL/uL (ref 3.87–5.11)
RDW: 15.6 % — ABNORMAL HIGH (ref 11.5–15.5)
WBC: 7 10*3/uL (ref 4.0–10.5)
nRBC: 0 % (ref 0.0–0.2)

## 2018-07-20 LAB — URINALYSIS, ROUTINE W REFLEX MICROSCOPIC
Bilirubin Urine: NEGATIVE
Glucose, UA: NEGATIVE mg/dL
Hgb urine dipstick: NEGATIVE
Ketones, ur: NEGATIVE mg/dL
Leukocytes,Ua: NEGATIVE
Nitrite: NEGATIVE
Protein, ur: NEGATIVE mg/dL
Specific Gravity, Urine: 1.015 (ref 1.005–1.030)
pH: 6 (ref 5.0–8.0)

## 2018-07-20 LAB — TROPONIN I: Troponin I: <0.03 ng/mL (ref <0.03)

## 2018-07-20 LAB — LIPASE, BLOOD: Lipase: 51 U/L (ref 11–51)

## 2018-07-20 MED ORDER — IOHEXOL 300 MG/ML  SOLN
100.0000 mL | Freq: Once | INTRAMUSCULAR | Status: AC | PRN
  Administered 2018-07-20: 18:00:00 100 mL via INTRAVENOUS

## 2018-07-20 NOTE — ED Provider Notes (Addendum)
Medical screening examination/treatment/procedure(s) were conducted as a shared visit with non-physician practitioner(s) and myself.  I personally evaluated the patient during the encounter. Briefly, the patient is a 85 y.o. female with history of hypertension, COPD, C. difficile on current vancomycin therapy presents the ED with abdominal pain.  Patient normal vitals.  No fever.  Patient has had some upper abdominal discomfort on and off for several weeks.  Was slightly worse today.  Has no pain upon my evaluation.  No tenderness of the abdomen.  EKG shows sinus rhythm.  Patient denies any specific chest pain, shortness of breath.  Is seated in a chair watching TV upon my evaluation.  Will evaluate with lab work, CT abdomen pelvis.  Will add a troponin for possible atypical ACS however unlikely given the chronicity of symptoms.  Patient with no significant anemia, electrolyte abnormality, leukocytosis.  Troponin normal.  Doubt cardiac process.  Urinalysis without signs of infection.  CT of the abdomen pelvis showed no signs of obstruction or other acute events.  Recommend follow-up with GI and discharged in ED in good condition.  Patient with incidental dilation of the ascending thoracic aorta and also a few small pulmonary nodules are incidentally found on CT scan.  Given her information about need for follow-up with her primary care doctor for repeat annual imaging.  This chart was dictated using voice recognition software.  Despite best efforts to proofread,  errors can occur which can change the documentation meaning.     EKG Interpretation  Date/Time:  Monday Jul 20 2018 15:30:36 EDT Ventricular Rate:  67 PR Interval:    QRS Duration: 76 QT Interval:  425 QTC Calculation: 449 R Axis:   -21 Text Interpretation:  Sinus rhythm Atrial premature complexes Borderline left axis deviation Confirmed by Virgina Norfolk [phone]) on 07/20/2018 4:14:59 PM           Virgina Norfolk, DO 07/20/18  1829    Lockie Mola, Marshia Tropea, DO 07/20/18 1830

## 2018-07-20 NOTE — ED Provider Notes (Signed)
[facility] EMERGENCY DEPARTMENT Provider Note   CSN: [id number] Arrival date & time: 07/20/18  1425    History   Chief Complaint Chief Complaint  Patient presents with   Abdominal Pain    HPI [name] is a 85 y.o. female.     HPI   Patient is an 85 year old female with a history of COPD, hypertension, hypothyroidism, irritable disease, recent treatment for C. difficile colitis currently on oral vancomycin presenting for epigastric discomfort in a bandlike pattern.  Patient reports that this is a ongoing symptom for several months.  She does not think it got worse today but did speak with her gastroenterologist over the phone today and relate her symptoms.  She reports she was instructed to come the emergency department for cardiac rule out.  She denies any shortness of breath above her baseline with her symptoms.  She denies any nausea, vomiting, or decreased appetite.  Patient reports she is now having 1 formed stool a day.  Last bowel movement yesterday and normal for patient.  Patient reports she had decreased flatus today.  She denies any melanotic stool or hematochezia.  Patient denies any fevers, chills, cough, hemoptysis, or recent exposure to individuals with COVID-19.  Past Medical History:  Diagnosis Date   Abnormal heart rhythm    pt not sure what kind.   Anemia    Arthritis    COPD (chronic obstructive pulmonary disease) (HCC)    Dry skin    GERD (gastroesophageal reflux disease)    Hypertension    Hypothyroidism    Irritable bowel syndrome    Kidney disease    Pneumonia    Shortness of breath    Weight loss    10-12 lbs in 2 months (03/2011)    Patient Active Problem List   Diagnosis Date Noted   Anxiety and depression 07/17/2017   Chest pain of uncertain etiology 01/17/2017   Anemia 07/21/2016   Arthritis, hip 04/20/2014   Can't get food down 02/01/2014   H/O cataract extraction 09/02/2013   Age-related macular  degeneration, dry 08/03/2013   Glaucoma suspect 08/03/2013   DDD (degenerative disc disease), lumbosacral 09/25/2012   External hemorrhoid 09/25/2012   Pain in joint, ankle and foot 08/10/2012   Contracture of toe of left foot 08/10/2012   Atherosclerosis of renal artery (HCC) 08/08/2012   OP (osteoporosis) 08/08/2012   Rhinitis, nonallergic, chronic 01/19/2012   GERD (gastroesophageal reflux disease) 01/19/2012   Adult hypothyroidism 11/14/2011   Atypical mycobacterial infection of lung (HCC) 07/03/2011   Lung nodule 07/03/2011   Osteoarthritis of right hip 04/26/2011   Aseptic necrosis (HCC) 01/27/2011   COPD (chronic obstructive pulmonary disease) with chronic bronchitis (HCC) 12/17/2010    Past Surgical History:  Procedure Laterality Date   ANTERIOR AND POSTERIOR VAGINAL REPAIR     CARDIAC CATHETERIZATION     6 years ago   COLONOSCOPY     ESOPHAGOGASTRODUODENOSCOPY     EYE SURGERY Bilateral 09/2013   cataract surgery with lens implant   THYROIDECTOMY     Left   TOE AMPUTATION Left    2nd toe   TONSILLECTOMY     TOTAL HIP ARTHROPLASTY  04/24/2011   Procedure: TOTAL HIP ARTHROPLASTY;  Surgeon: Nestor Lewandowsky, MD;  Location: [facility] OR;  Service: Orthopedics;  Laterality: Right;  DEPUY/PINNACLE CUP, SUMMIT BASIC STEM   TOTAL HIP ARTHROPLASTY Left 04/20/2014   Procedure: TOTAL HIP ARTHROPLASTY;  Surgeon: Nestor Lewandowsky, MD;  Location: [facility] OR;  Service:  Orthopedics;  Laterality: Left;     OB History   No obstetric history on file.      Home Medications    Prior to Admission medications   Medication Sig Start Date End Date Taking? Authorizing Provider  escitalopram (LEXAPRO) 5 MG tablet Take by mouth. 07/17/18 10/15/18 Yes [provider]  LORazepam (ATIVAN) 0.5 MG tablet Take by mouth. 07/17/18  Yes [provider]  saccharomyces boulardii (FLORASTOR) 250 MG capsule Take 250 mg by mouth 2 (two) times daily.   Yes [provider]    vancomycin (VANCOCIN) 125 MG capsule Take one pill four times a day for 14 days,twice a day for 7 days,once a day for 7 days, then everyother day until pills are finished. 07/08/18  Yes [provider]  acetaminophen (TYLENOL) 500 MG tablet Take 1,000 mg by mouth every 8 (eight) hours as needed.     [provider]  albuterol (VENTOLIN HFA) 108 (90 Base) MCG/ACT inhaler Inhale 2 puffs into the lungs every 6 (six) hours as needed for wheezing or shortness of breath. 07/06/18   Jetty Duhamel D, MD  amLODipine (NORVASC) 5 MG tablet Take 1 tablet by mouth daily as needed.  10/14/10   [provider]  azelastine (ASTELIN) 0.1 % nasal spray Place into the nose. 09/11/17   [provider]  benzonatate (TESSALON) 200 MG capsule Take 1 capsule (200 mg total) 3 (three) times daily as needed by mouth for cough. 01/17/17   Jetty Duhamel D, MD  carboxymethylcellul-glycerin (OPTIVE) 0.5-0.9 % ophthalmic solution Apply to eye.    [provider]  clindamycin (CLEOCIN) 300 MG capsule Take 1 capsule (300 mg total) by mouth 4 (four) times daily. X 7 days 03/30/18   Rolan Bucco, MD  DULoxetine (CYMBALTA) 60 MG capsule Take 60 mg by mouth 3 (three) times a week.     [provider]  famotidine (PEPCID) 40 MG tablet Take 1 tablet (40 mg total) daily by mouth. Patient not taking: Reported on 07/06/2018 01/17/17   Jetty Duhamel D, MD  ipratropium (ATROVENT) 0.06 % nasal spray Place 2 sprays 4 (four) times daily into the nose. If needed 01/17/17 07/31/19  Waymon Budge, MD  levothyroxine (SYNTHROID, LEVOTHROID) 50 MCG tablet Take 50 mcg by mouth daily before breakfast.    [provider]  metoprolol (TOPROL-XL) 50 MG 24 hr tablet Take 50 mg by mouth 2 (two) times daily.     [provider]  mupirocin cream (BACTROBAN) 2 % Apply 1 application topically 2 (two) times daily. 03/30/18   Rolan Bucco, MD  umeclidinium bromide (INCRUSE ELLIPTA) 62.5 MCG/INH  AEPB Inhale 1 puff into the lungs daily. 07/06/18   Waymon Budge, MD    Family History Family History  Problem Relation Age of Onset   Emphysema Sister        smoker   Allergies Brother    Heart disease Brother    Rheum arthritis Daughter    Cancer Mother        GI   Cancer Brother        throat   Cancer Sister        unsure    Social History Social History   Tobacco Use   Smoking status: Former Smoker    Packs/day: 2.00    Years: 26.00    Pack years: 52.00    Types: Cigarettes    Last attempt to quit: 03/04/1982    Years since quitting: 36.4  Smokeless tobacco: Never Used  Substance Use Topics   Alcohol use: Yes   Drug use: No     Allergies   Avelox [moxifloxacin hcl in nacl]; Biaxin [clarithromycin]; and Nsaids   Review of Systems Review of Systems  Constitutional: Positive for appetite change. Negative for chills and fever.  HENT: Negative for congestion, rhinorrhea, sinus pain and sore throat.   Eyes: Negative for visual disturbance.  Respiratory: Positive for shortness of breath. Negative for cough and chest tightness.   Cardiovascular: Negative for chest pain, palpitations and leg swelling.  Gastrointestinal: Positive for abdominal pain. Negative for blood in stool, diarrhea, nausea and vomiting.  Genitourinary: Negative for dysuria and flank pain.  Musculoskeletal: Negative for back pain and myalgias.  Skin: Negative for rash.  Neurological: Negative for dizziness, syncope and light-headedness.     Physical Exam Updated Vital Signs BP 129/75 (BP Location: Left Arm)    Pulse 72    Temp 98.2 F (36.8 C) (Oral)    Resp 18    Ht 5' (1.524 m)    Wt 44.8 kg    SpO2 98%    BMI 19.29 kg/m   Physical Exam Vitals signs and nursing note reviewed.  Constitutional:      General: She is not in acute distress.    Appearance: Normal appearance. She is well-developed. She is not ill-appearing or diaphoretic.  HENT:     Head: Normocephalic and  atraumatic.     Mouth/Throat:     Mouth: Mucous membranes are moist.  Eyes:     Conjunctiva/sclera: Conjunctivae normal.     Pupils: Pupils are equal, round, and reactive to light.  Neck:     Musculoskeletal: Normal range of motion and neck supple.  Cardiovascular:     Rate and Rhythm: Normal rate and regular rhythm.     Heart sounds: S1 normal and S2 normal. No murmur.  Pulmonary:     Effort: Pulmonary effort is normal.     Breath sounds: Normal breath sounds. No wheezing or rales.  Abdominal:     General: Bowel sounds are normal. There is no distension.     Palpations: Abdomen is soft.     Tenderness: There is no abdominal tenderness. There is no guarding or rebound.  Musculoskeletal: Normal range of motion.        General: No deformity.     Right lower leg: Edema present.     Left lower leg: Edema present.     Comments: Severe kyphosis.  Mild pitting LE edema bilaterally.  Lymphadenopathy:     Cervical: No cervical adenopathy.  Skin:    General: Skin is warm and dry.     Findings: No erythema or rash.  Neurological:     Mental Status: She is alert.     Comments: Cranial nerves grossly intact. Patient moves extremities symmetrically and with good coordination. Ambulatory in no acute distress.   Psychiatric:        Behavior: Behavior normal.        Thought Content: Thought content normal.        Judgment: Judgment normal.      ED Treatments / Results  Labs (all labs ordered are listed, but only abnormal results are displayed) Labs Reviewed  COMPREHENSIVE METABOLIC PANEL - Abnormal; Notable for the following components:      Result Value   Creatinine, Ser 1.12 (*)    GFR calc non Af Amer 44 (*)    GFR calc Af Amer 51 (*)  All other components within normal limits  CBC WITH DIFFERENTIAL/PLATELET - Abnormal; Notable for the following components:   Hemoglobin 10.9 (*)    HCT 35.0 (*)    RDW 15.6 (*)    All other components within normal limits  TROPONIN I    LIPASE, BLOOD  URINALYSIS, ROUTINE W REFLEX MICROSCOPIC    EKG EKG Interpretation  Date/Time:  Monday Jul 20 2018 15:30:36 EDT Ventricular Rate:  67 PR Interval:    QRS Duration: 76 QT Interval:  425 QTC Calculation: 449 R Axis:   -21 Text Interpretation:  Sinus rhythm Atrial premature complexes Borderline left axis deviation Confirmed by Virgina NorfolkAdam, [place] [phone]([id number]) on 07/20/2018 4:14:59 PM   Radiology Dg Chest 2 View  Result Date: 07/20/2018 CLINICAL DATA:  Epigastric pain EXAM: CHEST - 2 VIEW COMPARISON:  Chest x-ray dated 06/20/2017 FINDINGS: The cardiac silhouette is enlarged. Aortic calcifications are noted. There is no pneumothorax. There is pleuroparenchymal scarring at the lung bases. There is no displaced fracture. There is diffuse osteopenia which limits detection of nondisplaced fractures. There is some mild height loss of several midthoracic vertebral bodies, likely chronic IMPRESSION: No active cardiopulmonary disease. Electronically Signed   By: Katherine Mantlehristopher  Green M.D.   On: 07/20/2018 18:22   Ct Abdomen Pelvis W Contrast  Result Date: 07/20/2018 CLINICAL DATA:  Upper abdominal pain for the past few months. EXAM: CT ABDOMEN AND PELVIS WITH CONTRAST TECHNIQUE: Multidetector CT imaging of the abdomen and pelvis was performed using the standard protocol following bolus administration of intravenous contrast. CONTRAST:  100mL OMNIPAQUE IOHEXOL 300 MG/ML  SOLN COMPARISON:  CT chest dated Jul 05, 2011. FINDINGS: The pelvis is not well evaluated due to streak and motion artifact. Lower chest: No acute abnormality. Unchanged 7 mm nodule in the right lower lobe (series 4, image 8). There are a few small nodules in both lower lobes measuring up to 3 mm, new since 2013. Hepatobiliary: 2.5 cm cyst in the left hepatic lobe, minimally increased in size since 2013. The gallbladder is contracted. No gallstones, gallbladder wall thickening, or biliary dilatation. Pancreas: Unremarkable. No pancreatic  ductal dilatation or surrounding inflammatory changes. Spleen: Normal in size without focal abnormality. Adrenals/Urinary Tract: Adrenal glands are unremarkable. Right renal cysts measuring up to 3.4 cm. No renal calculi or hydronephrosis. The bladder is unremarkable. Stomach/Bowel: Moderate hiatal hernia. The stomach is otherwise within normal limits. The cecum is within the central lower abdomen. No bowel wall thickening, distention, or surrounding inflammatory changes. Mildly increased colonic stool burden. The appendix is not visualized, but there are no signs of inflammation at the base of the cecum. Vascular/Lymphatic: Aneurysmal dilatation of the ascending thoracic aorta, measuring 4.3 cm. Aortic atherosclerosis. No enlarged abdominal or pelvic lymph nodes. Reproductive: Uterus and bilateral adnexa are unremarkable. Other: No abdominal wall hernia or abnormality. No abdominopelvic ascites. No pneumoperitoneum. Musculoskeletal: No acute or significant osseous findings. Severe dextroscoliosis of the lumbar spine. Severe thoracolumbar spondylosis prior bilateral total hip arthroplasties. IMPRESSION: 1.  No acute intra-abdominal process. 2. Aneurysmal dilatation of the ascending thoracic aorta, measuring 4.3 cm. Recommend annual imaging followup by CTA or MRA. This recommendation follows 2010 ACCF/AHA/AATS/ACR/ASA/SCA/SCAI/SIR/STS/SVM Guidelines for the Diagnosis and Management of Patients with Thoracic Aortic Disease. Circulation. 2010; 121: U045-W098: E266-e369. Aortic aneurysm NOS (ICD10-I71.9) 3.  Aortic atherosclerosis (ICD10-I70.0). 4. There are few new small pulmonary nodules in both lower lobes measuring up to 3 mm. No follow-up needed if patient is low-risk (and has no known or suspected primary neoplasm). Non-contrast chest CT can  be considered in 12 months if patient is high-risk. This recommendation follows the consensus statement: Guidelines for Management of Incidental Pulmonary Nodules Detected on CT Images:  From the Fleischner Society 2017; Radiology 2017; 284:228-243. Electronically Signed   By: Obie Dredge M.D.   On: 07/20/2018 18:22    Procedures Procedures (including critical care time)  Medications Ordered in ED Medications  iohexol (OMNIPAQUE) 300 MG/ML solution 100 mL (has no administration in time range)     Initial Impression / Assessment and Plan / ED Course  I have reviewed the triage vital signs and the nursing notes.  Pertinent labs & imaging results that were available during my care of the patient were reviewed by me and considered in my medical decision making (see chart for details).  Clinical Course as of Jul 20 1654  Mon Jul 20, 2018  1600 Stable  Hemoglobin(!): 10.9 [AM]    Clinical Course User Index [AM] Elisha Ponder, PA-C       This is a well-appearing 84 year old female with past medical history of COPD, atypical mycobaterial infection, hypertension, current C. difficile colitis infection, presenting for epigastric discomfort ovale pattern.  Effective diagnosis includes ACS, pancreatitis, small bowel obstruction, toxic megacolon.  She has a benign abdominal exam and is in no acute distress.  Will assess with cardiac enzymes, CBC, CMP, chest x-ray, EKG, and CT abdomen and pelvis.  Records reviewed, and patient had a cardiac catheterization not in system.  Unable to identify report, but able to pull cardiology records from 2019 with no evidence of obstructive CAD.  EKG here normal sinus rhythm with no evidence of acute ischemia, infarction, or arrhythmia.  Troponin is normal.  Patient has stable normocytic anemia with hemoglobin at 10.9.  Chest x-ray shows no acute cardiopulmonary disease, reviewed by me.  She does have chronic scarring in lung bases.  CT abdomen and pelvis with contrast demonstrates unchanged pulmonary nodules since 2013, 2.5 cm hepatic cyst, presumed new aneurysmal dilatation of the thoracic aorta.  Patient was informed of these results and  the need for follow-up with her primary care provider for routine surveillance.Urinalysis without evidence of infection.  No acute process identified on work-up today as cause of patient's discomfort.  Suspect ongoing gaseous discomfort secondary to C. difficile colitis.  Patient has an appointment with her  PCP tomorrow.  Patient given return precautions for any acute shortness of breath, chest pain, tractable nausea or vomiting or worsening abdominal pain.  Patient is in understanding and agrees with plan of care.  This is a shared visit with Dr. Virgina Norfolk. Patient was independently evaluated by this attending physician. Attending physician consulted in evaluation and discharge management.  Final Clinical Impressions(s) / ED Diagnoses   Final diagnoses:  Generalized abdominal pain    ED Discharge Orders    None       Delia Chimes 07/20/18 1942    Virgina Norfolk, DO 07/20/18 1944

## 2018-07-20 NOTE — ED Triage Notes (Signed)
States she feels like she is bent over causing her to have pain in her upper abdomen. States it is a feeling she has been having for months. No BM today. Hx of cdiff. She is taking Vancomycin.

## 2018-07-20 NOTE — Discharge Instructions (Addendum)
2. Aneurysmal dilatation of the ascending thoracic aorta, measuring 4.3 cm. Recommend annual imaging followup by CTA or MRA. This recommendation follows 2010 ACCF/AHA/AATS/ACR/ASA/SCA/SCAI/SIR/STS/SVM Guidelines for the Diagnosis and Management of Patients with Thoracic Aortic Disease. Circulation. 2010; 121: H417-E081. Aortic aneurysm NOS (ICD10-I71.9) 3.  Aortic atherosclerosis (ICD10-I70.0). 4. There are few new small pulmonary nodules in both lower lobes measuring up to 3 mm. No follow-up needed if patient is low-risk (and has no known or suspected primary neoplasm). Non-contrast chest CT can be considered in 12 months if patient is high-risk. This recommendation follows the consensus statement: Guidelines for Management of Incidental Pulmonary Nodules Detected on CT Images: From the Fleischner Society 2017; Radiology 2017; 284:228-243.

## 2018-07-20 NOTE — ED Notes (Signed)
ED Provider at bedside. 

## 2018-07-21 ENCOUNTER — Telehealth: Payer: Self-pay | Admitting: Medical

## 2018-07-21 ENCOUNTER — Encounter: Payer: Self-pay | Admitting: Medical

## 2018-07-21 ENCOUNTER — Ambulatory Visit (INDEPENDENT_AMBULATORY_CARE_PROVIDER_SITE_OTHER): Payer: Medicare Other | Admitting: Medical

## 2018-07-21 VITALS — BP 129/72 | HR 72 | Ht 60.0 in

## 2018-07-21 DIAGNOSIS — E039 Hypothyroidism, unspecified: Secondary | ICD-10-CM | POA: Diagnosis not present

## 2018-07-21 DIAGNOSIS — K219 Gastro-esophageal reflux disease without esophagitis: Secondary | ICD-10-CM | POA: Diagnosis not present

## 2018-07-21 DIAGNOSIS — D649 Anemia, unspecified: Secondary | ICD-10-CM | POA: Diagnosis not present

## 2018-07-21 DIAGNOSIS — R109 Unspecified abdominal pain: Secondary | ICD-10-CM | POA: Diagnosis not present

## 2018-07-21 DIAGNOSIS — I10 Essential (primary) hypertension: Secondary | ICD-10-CM

## 2018-07-21 DIAGNOSIS — A0472 Enterocolitis due to Clostridium difficile, not specified as recurrent: Secondary | ICD-10-CM

## 2018-07-21 NOTE — Patient Instructions (Addendum)
For history of recent abdomen pain but also history of GERD on chart review, I do want you to increase your famotidine to 40 mg a day.  Also continue with your probiotic, Tums and add Gas-X.  Glad to hear that your abdomen pain resolved yesterday early evening.  If you have recurrent pain despite these measures please notify me.  In that event would likely refer you back to your gastroenterologist.  Also might consider referral to cardiologist.  However troponin and EKG done in ED were both negative.  For history of C. difficile, continue with vancomycin.  Her stools are now becoming formed.  Would remind you to have some food on your stomach/not to take it on empty stomach as that might cause some stomach irritation.  Your blood pressure has been controlled recently.  Continue on current medication regimen.  You have some chronic anemia by your report.  Would want to repeat a CBC on your next visit towards the end of the summer when you have your first office visit.  However if you feel more fatigue would recommend getting repeat CBC earlier.  Follow-up in 1week virtual visit.  Hopefully that will be done by FaceTime.

## 2018-07-21 NOTE — Telephone Encounter (Signed)
Unable to leave message for pt to call and schedule follow up appt, pt has no vm set up

## 2018-07-21 NOTE — Progress Notes (Signed)
Subjective:    Patient ID: [name], female    DOB: 11/22/1928, 85 y.o.   MRN: [id number]  HPI  Virtual Visit via Telephone Note  I connected with [name] on 07/21/18 at 10:00 AM EDT by telephone and verified that I am speaking with the correct person using two identifiers.  Location: Patient: home Provider: home.   I discussed the limitations, risks, security and privacy concerns of performing an evaluation and management service by telephone and the availability of in person appointments. I also discussed with the patient that there may be a patient responsible charge related to this service. The patient expressed understanding and agreed to proceed.   History of Present Illness: Pt has had some recent abdomen that was moderate. Pt has GI MD who was concerned that her abdomen may have represented cardiac issue. So she was advised to go to ED for cardiac rule out. She had band of pain around her epigastric area.  Pt ekg showed sinus rhythm. Lab work up was normal.  Pt has had some c. dificile recently and she is mid way through vacomycin course. She was mid way through one month of vancomycin treatment.   Pt states since September she was on anbtiotic after abrasion from fall. Pain around stomach/band went away late afternoon. Pt states at night usually she feel bloated. Every night she uses tums with anti-gas product type med.  ED hpi below "Patient is an 85 year old female with a history of COPD, hypertension, hypothyroidism, irritable disease, recent treatment for C. difficile colitis currently on oral vancomycin presenting for epigastric discomfort in a bandlike pattern.  Patient reports that this is a ongoing symptom for several months.  She does not think it got worse today but did speak with her gastroenterologist over the phone today and relate her symptoms.  She reports she was instructed to come the emergency department for cardiac rule out.  She denies any  shortness of breath above her baseline with her symptoms.  She denies any nausea, vomiting, or decreased appetite.  Patient reports she is now having 1 formed stool a day.  Last bowel movement yesterday and normal for patient.  Patient reports she had decreased flatus today.  She denies any melanotic stool or hematochezia.  Patient denies any fevers, chills, cough, hemoptysis, or recent exposure to individuals with COVID-19."  Assessment This is a well-appearing 85 year old female with past medical history of COPD, atypical mycobaterial infection, hypertension, current C. difficile colitis infection, presenting for epigastric discomfort ovale pattern.  Effective diagnosis includes ACS, pancreatitis, small bowel obstruction, toxic megacolon.  She has a benign abdominal exam and is in no acute distress.  Will assess with cardiac enzymes, CBC, CMP, chest x-ray, EKG, and CT abdomen and pelvis.  Records reviewed, and patient had a cardiac catheterization not in system.  Unable to identify report, but able to pull cardiology records from 2019 with no evidence of obstructive CAD.  EKG here normal sinus rhythm with no evidence of acute ischemia, infarction, or arrhythmia.  Troponin is normal.  Patient has stable normocytic anemia with hemoglobin at 10.9.  Chest x-ray shows no acute cardiopulmonary disease, reviewed by me.  She does have chronic scarring in lung bases.  CT abdomen and pelvis with contrast demonstrates unchanged pulmonary nodules since 2013, 2.5 cm hepatic cyst, presumed new aneurysmal dilatation of the thoracic aorta.  Patient was informed of these results and the need for follow-up with her primary care provider for routine surveillance.Urinalysis without  evidence of infection.  No acute process identified on work-up today as cause of patient's discomfort.  Suspect ongoing gaseous discomfort secondary to C. difficile colitis.  Patient has an appointment with her  PCP tomorrow.  Patient given return  precautions for any acute shortness of breath, chest pain, tractable nausea or vomiting or worsening abdominal pain.  Patient is in understanding and agrees with plan of care.     Observations/Objective: General- no acute distress.  Assessment and Plan: For history of recent abdomen pain but also history of GERD on chart review, I do want you to increase your famotidine to 40 mg a day.  Also continue with your probiotic, Tums and add Gas-X.  Glad to hear that your abdomen pain resolved yesterday early evening.  If you have recurrent pain despite these measures please notify me.  In that event would likely refer you back to your gastroenterologist.  Also might consider referral to cardiologist.  However troponin and EKG done in ED were both negative.  For history of C. difficile, continue with vancomycin.  Her stools are now becoming formed.  Would remind you to have some food on your stomach/not to take it on empty stomach as that might cause some stomach irritation.  Your blood pressure has been controlled recently.  Continue on current medication regimen.  You have some chronic anemia by your report.  Would want to repeat a CBC on your next visit towards the end of the summer when you have your first office visit.  However if you feel more fatigue would recommend getting repeat CBC earlier.  Follow-up in 1week virtual visit.  Hopefully that will be done by FaceTime.  30 minutes spent with patient today.  New patient visit as well as emergency department follow-up.  Follow Up Instructions:    I discussed the assessment and treatment plan with the patient. The patient was provided an opportunity to ask questions and all were answered. The patient agreed with the plan and demonstrated an understanding of the instructions.   The patient was advised to call back or seek an in-person evaluation if the symptoms worsen or if the condition fails to improve as anticipated.     Esperanza Richters,  PA-C    Review of Systems  Constitutional: Negative for chills, fatigue and fever.  HENT: Negative for congestion, ear discharge, ear pain and facial swelling.   Respiratory: Negative for cough, chest tightness, shortness of breath and wheezing.   Cardiovascular: Negative for chest pain and palpitations.  Gastrointestinal: Positive for abdominal pain. Negative for blood in stool, diarrhea, nausea, rectal pain and vomiting.       Now resolved.   Stools are beginning to become formed.  Genitourinary: Negative for decreased urine volume, difficulty urinating, enuresis, frequency, hematuria, menstrual problem, pelvic pain and vaginal pain.  Musculoskeletal: Negative for back pain and myalgias.  Skin: Positive for rash.  Hematological: Negative for adenopathy. Does not bruise/bleed easily.  Psychiatric/Behavioral: Negative for behavioral problems, decreased concentration, dysphoric mood, hallucinations, sleep disturbance and suicidal ideas. The patient is not nervous/anxious.      Past Medical History:  Diagnosis Date  . Abnormal heart rhythm    pt not sure what kind.  . Anemia   . Arthritis   . COPD (chronic obstructive pulmonary disease) (HCC)   . Dry skin   . GERD (gastroesophageal reflux disease)   . Hypertension   . Hypothyroidism   . Irritable bowel syndrome   . Kidney disease   . Pneumonia   .  Shortness of breath   . Weight loss    10-12 lbs in 2 months (03/2011)     Social History   Socioeconomic History  . Marital status: Single    Spouse name: Not on file  . Number of children: 4  . Years of education: Not on file  . Highest education level: Not on file  Occupational History  . Occupation: retired-RN  Social Needs  . Financial resource strain: Not on file  . Food insecurity:    Worry: Not on file    Inability: Not on file  . Transportation needs:    Medical: Not on file    Non-medical: Not on file  Tobacco Use  . Smoking status: Former Smoker     Packs/day: 2.00    Years: 26.00    Pack years: 52.00    Types: Cigarettes    Last attempt to quit: 03/04/1982    Years since quitting: 36.4  . Smokeless tobacco: Never Used  Substance and Sexual Activity  . Alcohol use: Yes  . Drug use: No  . Sexual activity: Not on file  Lifestyle  . Physical activity:    Days per week: Not on file    Minutes per session: Not on file  . Stress: Not on file  Relationships  . Social connections:    Talks on phone: Not on file    Gets together: Not on file    Attends religious service: Not on file    Active member of club or organization: Not on file    Attends meetings of clubs or organizations: Not on file    Relationship status: Not on file  . Intimate partner violence:    Fear of current or ex partner: Not on file    Emotionally abused: Not on file    Physically abused: Not on file    Forced sexual activity: Not on file  Other Topics Concern  . Not on file  Social History Narrative  . Not on file    Past Surgical History:  Procedure Laterality Date  . ANTERIOR AND POSTERIOR VAGINAL REPAIR    . CARDIAC CATHETERIZATION     6 years ago  . COLONOSCOPY    . ESOPHAGOGASTRODUODENOSCOPY    . EYE SURGERY Bilateral 09/2013   cataract surgery with lens implant  . THYROIDECTOMY     Left  . TOE AMPUTATION Left    2nd toe  . TONSILLECTOMY    . TOTAL HIP ARTHROPLASTY  04/24/2011   Procedure: TOTAL HIP ARTHROPLASTY;  Surgeon: Nestor Lewandowsky, MD;  Location: [facility] OR;  Service: Orthopedics;  Laterality: Right;  DEPUY/PINNACLE CUP, SUMMIT BASIC STEM  . TOTAL HIP ARTHROPLASTY Left 04/20/2014   Procedure: TOTAL HIP ARTHROPLASTY;  Surgeon: Nestor Lewandowsky, MD;  Location: [facility] OR;  Service: Orthopedics;  Laterality: Left;    Family History  Problem Relation Age of Onset  . Emphysema Sister        smoker  . Allergies Brother   . Heart disease Brother   . Rheum arthritis Daughter   . Cancer Mother        GI  . Cancer Brother        throat  . Cancer  Sister        unsure    Allergies  Allergen Reactions  . Avelox [Moxifloxacin Hcl In Nacl] Other (See Comments)    Flushes face  . Biaxin [Clarithromycin]     Diarrhea and bad taste in mouth  . Nsaids  Does not take due to kidney disease    Current Outpatient Medications on File Prior to Visit  Medication Sig Dispense Refill  . acetaminophen (TYLENOL) 500 MG tablet Take 1,000 mg by mouth every 8 (eight) hours as needed.     Marland Kitchen albuterol (VENTOLIN HFA) 108 (90 Base) MCG/ACT inhaler Inhale 2 puffs into the lungs every 6 (six) hours as needed for wheezing or shortness of breath. 1 Inhaler 12  . amLODipine (NORVASC) 5 MG tablet Take 1 tablet by mouth daily as needed.     Marland Kitchen azelastine (ASTELIN) 0.1 % nasal spray Place into the nose.    . benzonatate (TESSALON) 200 MG capsule Take 1 capsule (200 mg total) 3 (three) times daily as needed by mouth for cough. 30 capsule 4  . carboxymethylcellul-glycerin (OPTIVE) 0.5-0.9 % ophthalmic solution Apply to eye.    . clindamycin (CLEOCIN) 300 MG capsule Take 1 capsule (300 mg total) by mouth 4 (four) times daily. X 7 days 28 capsule 0  . DULoxetine (CYMBALTA) 60 MG capsule Take 60 mg by mouth 3 (three) times a week.     . escitalopram (LEXAPRO) 5 MG tablet Take by mouth.    . famotidine (PEPCID) 40 MG tablet Take 1 tablet (40 mg total) daily by mouth. (Patient not taking: Reported on 07/06/2018) 30 tablet 0  . ipratropium (ATROVENT) 0.06 % nasal spray Place 2 sprays 4 (four) times daily into the nose. If needed 45 mL 3  . levothyroxine (SYNTHROID, LEVOTHROID) 50 MCG tablet Take 50 mcg by mouth daily before breakfast.    . LORazepam (ATIVAN) 0.5 MG tablet Take by mouth.    . metoprolol (TOPROL-XL) 50 MG 24 hr tablet Take 50 mg by mouth 2 (two) times daily.     . mupirocin cream (BACTROBAN) 2 % Apply 1 application topically 2 (two) times daily. 15 g 0  . saccharomyces boulardii (FLORASTOR) 250 MG capsule Take 250 mg by mouth 2 (two) times daily.    Marland Kitchen  umeclidinium bromide (INCRUSE ELLIPTA) 62.5 MCG/INH AEPB Inhale 1 puff into the lungs daily. 90 each 3  . vancomycin (VANCOCIN) 125 MG capsule Take one pill four times a day for 14 days,twice a day for 7 days,once a day for 7 days, then everyother day until pills are finished.     No current facility-administered medications on file prior to visit.     There were no vitals taken for this visit.      Objective:   Physical Exam       Assessment & Plan:

## 2018-08-07 ENCOUNTER — Ambulatory Visit: Payer: Self-pay | Admitting: Internal Medicine

## 2018-10-25 ENCOUNTER — Other Ambulatory Visit: Payer: Self-pay

## 2018-10-25 ENCOUNTER — Encounter (HOSPITAL_BASED_OUTPATIENT_CLINIC_OR_DEPARTMENT_OTHER): Payer: Self-pay | Admitting: Emergency Medicine

## 2018-10-25 ENCOUNTER — Emergency Department (HOSPITAL_BASED_OUTPATIENT_CLINIC_OR_DEPARTMENT_OTHER)
Admission: EM | Admit: 2018-10-25 | Discharge: 2018-10-25 | Disposition: A | Discharge status: Home / Self Care | Payer: Medicare Other | Attending: Emergency Medicine | Admitting: Emergency Medicine

## 2018-10-25 DIAGNOSIS — J449 Chronic obstructive pulmonary disease, unspecified: Secondary | ICD-10-CM | POA: Diagnosis not present

## 2018-10-25 DIAGNOSIS — E039 Hypothyroidism, unspecified: Secondary | ICD-10-CM | POA: Diagnosis not present

## 2018-10-25 DIAGNOSIS — Z79899 Other long term (current) drug therapy: Secondary | ICD-10-CM | POA: Insufficient documentation

## 2018-10-25 DIAGNOSIS — Y939 Activity, unspecified: Secondary | ICD-10-CM | POA: Diagnosis not present

## 2018-10-25 DIAGNOSIS — T148XXA Other injury of unspecified body region, initial encounter: Secondary | ICD-10-CM

## 2018-10-25 DIAGNOSIS — Z96643 Presence of artificial hip joint, bilateral: Secondary | ICD-10-CM | POA: Diagnosis not present

## 2018-10-25 DIAGNOSIS — Z87891 Personal history of nicotine dependence: Secondary | ICD-10-CM | POA: Insufficient documentation

## 2018-10-25 DIAGNOSIS — W01198A Fall on same level from slipping, tripping and stumbling with subsequent striking against other object, initial encounter: Secondary | ICD-10-CM | POA: Insufficient documentation

## 2018-10-25 DIAGNOSIS — S51801A Unspecified open wound of right forearm, initial encounter: Secondary | ICD-10-CM | POA: Diagnosis present

## 2018-10-25 DIAGNOSIS — Y999 Unspecified external cause status: Secondary | ICD-10-CM | POA: Diagnosis not present

## 2018-10-25 DIAGNOSIS — Y92009 Unspecified place in unspecified non-institutional (private) residence as the place of occurrence of the external cause: Secondary | ICD-10-CM | POA: Insufficient documentation

## 2018-10-25 HISTORY — DX: Enterocolitis due to Clostridium difficile, not specified as recurrent: A04.72

## 2018-10-25 MED ORDER — BACITRACIN ZINC 500 UNIT/GM EX OINT: TOPICAL_OINTMENT | Freq: Two times a day (BID) | CUTANEOUS | Status: DC

## 2018-10-25 NOTE — ED Notes (Signed)
Mepitel dressing applied, topped with non-adherent dressing, wrapped with kerlix and coban

## 2018-10-25 NOTE — Discharge Instructions (Signed)
Please follow instructions regarding wound care. Apply antibiotic ointment to the area. Return to the ED if you start to have worsening symptoms, signs of infection including redness, swelling, drainage from the area or additional injuries.

## 2018-10-25 NOTE — ED Triage Notes (Signed)
Cut LFA on cardboard box around 1200, as a result of losing her balance. Denies LOC or dizziness. Dressing Dry, tact to left arm, CMS intact

## 2018-10-25 NOTE — ED Provider Notes (Signed)
[facility] EMERGENCY DEPARTMENT Provider Note   CSN: [id number] Arrival date & time: 10/25/18  1727     History   Chief Complaint Chief Complaint  Patient presents with  . Laceration    HPI [name] is a 85 y.o. female with a past medical history of COPD, IBS, who presents to ED for L arm laceration that occurred approximately 5hrs prior to arrival.  States that she tripped over a bump in the carpet and sustained the laceration from a cardboard box that was open.  She denies any head injury, loss of consciousness and has been ambulatory with normal gait since then.  States that she does not have any joint pains, headache, vision changes, vomiting, numbness in arms or legs.  She is up-to-date on tetanus.     HPI  Past Medical History:  Diagnosis Date  . Abnormal heart rhythm    pt not sure what kind.  . Anemia   . Arthritis   . C. difficile diarrhea   . COPD (chronic obstructive pulmonary disease) (HCC)   . Dry skin   . GERD (gastroesophageal reflux disease)   . Hypertension   . Hypothyroidism   . Irritable bowel syndrome   . Kidney disease   . Pneumonia   . Shortness of breath   . Weight loss    10-12 lbs in 2 months (03/2011)    Patient Active Problem List   Diagnosis Date Noted  . Anxiety and depression 07/17/2017  . Chest pain of uncertain etiology 01/17/2017  . Anemia 07/21/2016  . Arthritis, hip 04/20/2014  . Can't get food down 02/01/2014  . H/O cataract extraction 09/02/2013  . Age-related macular degeneration, dry 08/03/2013  . Glaucoma suspect 08/03/2013  . DDD (degenerative disc disease), lumbosacral 09/25/2012  . External hemorrhoid 09/25/2012  . Pain in joint, ankle and foot 08/10/2012  . Contracture of toe of left foot 08/10/2012  . Atherosclerosis of renal artery (HCC) 08/08/2012  . OP (osteoporosis) 08/08/2012  . Rhinitis, nonallergic, chronic 01/19/2012  . GERD (gastroesophageal reflux disease) 01/19/2012  . Adult  hypothyroidism 11/14/2011  . Atypical mycobacterial infection of lung (HCC) 07/03/2011  . Lung nodule 07/03/2011  . Osteoarthritis of right hip 04/26/2011  . Aseptic necrosis (HCC) 01/27/2011  . COPD (chronic obstructive pulmonary disease) with chronic bronchitis (HCC) 12/17/2010    Past Surgical History:  Procedure Laterality Date  . ANTERIOR AND POSTERIOR VAGINAL REPAIR    . CARDIAC CATHETERIZATION     6 years ago  . COLONOSCOPY    . ESOPHAGOGASTRODUODENOSCOPY    . EYE SURGERY Bilateral 09/2013   cataract surgery with lens implant  . THYROIDECTOMY     Left  . TOE AMPUTATION Left    2nd toe  . TONSILLECTOMY    . TOTAL HIP ARTHROPLASTY  04/24/2011   Procedure: TOTAL HIP ARTHROPLASTY;  Surgeon: Nestor LewandowskyFrank J Rowan, MD;  Location: [facility] OR;  Service: Orthopedics;  Laterality: Right;  DEPUY/PINNACLE CUP, SUMMIT BASIC STEM  . TOTAL HIP ARTHROPLASTY Left 04/20/2014   Procedure: TOTAL HIP ARTHROPLASTY;  Surgeon: Nestor LewandowskyFrank J Rowan, MD;  Location: [facility] OR;  Service: Orthopedics;  Laterality: Left;     OB History   No obstetric history on file.      Home Medications    Prior to Admission medications   Medication Sig Start Date End Date Taking? Authorizing Provider  acetaminophen (TYLENOL) 500 MG tablet Take 1,000 mg by mouth every 8 (eight) hours as needed.     [provider]  albuterol (VENTOLIN HFA) 108 (90 Base) MCG/ACT inhaler Inhale 2 puffs into the lungs every 6 (six) hours as needed for wheezing or shortness of breath. 07/06/18   Jetty DuhamelYoung, Clinton D, MD  amLODipine (NORVASC) 5 MG tablet Take 1 tablet by mouth daily as needed.  10/14/10   [provider]  azelastine (ASTELIN) 0.1 % nasal spray Place into the nose. 09/11/17   [provider]  benzonatate (TESSALON) 200 MG capsule Take 1 capsule (200 mg total) 3 (three) times daily as needed by mouth for cough. 01/17/17   Jetty DuhamelYoung, Clinton D, MD  carboxymethylcellul-glycerin (OPTIVE) 0.5-0.9 % ophthalmic solution Apply to eye.     [provider]  clindamycin (CLEOCIN) 300 MG capsule Take 1 capsule (300 mg total) by mouth 4 (four) times daily. X 7 days 03/30/18   Rolan BuccoBelfi, Melanie, MD  DULoxetine (CYMBALTA) 60 MG capsule Take 60 mg by mouth 3 (three) times a week.     [provider]  escitalopram (LEXAPRO) 5 MG tablet Take by mouth. 07/17/18 10/15/18  [provider]  famotidine (PEPCID) 40 MG tablet Take 1 tablet (40 mg total) daily by mouth. Patient not taking: Reported on 07/06/2018 01/17/17   Jetty DuhamelYoung, Clinton D, MD  ipratropium (ATROVENT) 0.06 % nasal spray Place 2 sprays 4 (four) times daily into the nose. If needed 01/17/17 07/31/19  Waymon BudgeYoung, Clinton D, MD  levothyroxine (SYNTHROID, LEVOTHROID) 50 MCG tablet Take 50 mcg by mouth daily before breakfast.    [provider]  LORazepam (ATIVAN) 0.5 MG tablet Take by mouth. 07/17/18   [provider]  metoprolol (TOPROL-XL) 50 MG 24 hr tablet Take 50 mg by mouth 2 (two) times daily.     [provider]  mupirocin cream (BACTROBAN) 2 % Apply 1 application topically 2 (two) times daily. 03/30/18   Rolan BuccoBelfi, Melanie, MD  saccharomyces boulardii (FLORASTOR) 250 MG capsule Take 250 mg by mouth 2 (two) times daily.    [provider]  umeclidinium bromide (INCRUSE ELLIPTA) 62.5 MCG/INH AEPB Inhale 1 puff into the lungs daily. 07/06/18   Waymon BudgeYoung, Clinton D, MD  vancomycin (VANCOCIN) 125 MG capsule Take one pill four times a day for 14 days,twice a day for 7 days,once a day for 7 days, then everyother day until pills are finished. 07/08/18   [provider]    Family History Family History  Problem Relation Age of Onset  . Emphysema Sister        smoker  . Allergies Brother   . Heart disease Brother   . Rheum arthritis Daughter   . Cancer Mother        GI  . Cancer Brother        throat  . Cancer Sister        unsure    Social History Social History   Tobacco Use  . Smoking status: Former Smoker    Packs/day: 2.00     Years: 26.00    Pack years: 52.00    Types: Cigarettes    Quit date: 03/04/1982    Years since quitting: 36.6  . Smokeless tobacco: Never Used  Substance Use Topics  . Alcohol use: Yes    Comment: social  . Drug use: No     Allergies   Avelox [moxifloxacin hcl in nacl], Biaxin [clarithromycin], and Nsaids   Review of Systems Review of Systems  Constitutional: Negative for chills and fever.  Gastrointestinal: Negative for nausea and vomiting.  Skin: Positive for wound.  Neurological: Negative for syncope, weakness, numbness and headaches.     Physical Exam Updated Vital Signs BP 136/79 (BP Location: Right Arm)   Pulse 68   Temp 98 F (36.7 C) (Oral)   Resp 18   Ht 5' (1.524 m)   Wt 45.4 kg   SpO2 99%   BMI 19.53 kg/m   Physical Exam Vitals signs and nursing note reviewed.  Constitutional:      General: She is not in acute distress.    Appearance: She is well-developed. She is not diaphoretic.  HENT:     Head: Normocephalic and atraumatic.  Eyes:     General: No scleral icterus.    Conjunctiva/sclera: Conjunctivae normal.  Neck:     Musculoskeletal: Normal range of motion.  Cardiovascular:     Rate and Rhythm: Normal rate and regular rhythm.     Heart sounds: Normal heart sounds.  Pulmonary:     Effort: Pulmonary effort is normal. No respiratory distress.     Breath sounds: Normal breath sounds.  Musculoskeletal:     Comments: FROM of L elbow, wrist and digits.  Skin:    Findings: No rash.     Comments: Approximately 7cm curvilinear skin avulsion noted to left forearm.  Neurological:     General: No focal deficit present.     Mental Status: She is alert and oriented to person, place, and time.     Cranial Nerves: No cranial nerve deficit.     Sensory: No sensory deficit.     Motor: No weakness.      ED Treatments / Results  Labs (all labs ordered are listed, but only abnormal results are displayed) Labs Reviewed - No data to display  EKG  None  Radiology No results found.  Procedures Procedures (including critical care time)  Medications Ordered in ED Medications  bacitracin ointment (has no administration in time range)     Initial Impression / Assessment and Plan / ED Course  I have reviewed the triage vital signs and the nursing notes.  Pertinent labs & imaging results that were available during my care of the patient were reviewed by me and considered in my medical decision making (see chart for details).        85 year old female presents to ED for skin tear to left forearm that occurred prior to arrival.  States that she lost her balance after she tripped on a bump in the carpet while walking in her house.  Sustained the skin tear from a cardboard box.  States that she is up-to-date on vaccinations.  Skin tear noted on left forearm without active bleeding.  Is neurovascularly intact.  She is alert and oriented x3 denies head injury, loss of consciousness or headache.  Area cleaned, bacitracin and nonadherent dressing applied.  We will have him follow-up with PCP and return for worsening symptoms.  Patient is hemodynamically stable, in NAD, and able to ambulate in the ED. Evaluation does not show pathology that would require ongoing emergent intervention or inpatient treatment. I explained the diagnosis to the patient. Pain has been managed and has no complaints prior to discharge. Patient is comfortable with above plan and is stable for discharge at this time. All questions were answered prior to disposition. Strict return precautions for returning to the ED were discussed. Encouraged follow up with PCP.   An After Visit Summary was printed and given to the patient.   Portions of this note were generated with [employer]. Dictation errors  may occur despite best attempts at proofreading.   Final Clinical Impressions(s) / ED Diagnoses   Final diagnoses:  Skin avulsion    ED Discharge Orders     None       Dietrich PatesKhatri, Seraiah Nowack, PA-C 10/25/18 1849    Vanetta MuldersZackowski, Scott, MD 10/31/18 1227

## 2018-10-25 NOTE — ED Provider Notes (Signed)
Medical screening examination/treatment/procedure(s) were conducted as a shared visit with non-physician practitioner(s) and myself.  I personally evaluated the patient during the encounter.      Patient seen by me along with physician assistant.  Very nice elderly lady that lost her balance at home and ended up cutting her left forearm on the edge of a cardboard box at around 12 noon.  Did not have any loss of consciousness.  Did not hit her head no neck pain no back pain.  Arm itself is not painful tetanus is up-to-date.  Patient with an L-shaped skin tear to the left forearm measuring about 8 to 9 cm.  No specific repair required other than wound care.  No concern for any underlying bony injury.   Fredia Sorrow, MD 10/25/18 [phone]

## 2018-11-05 ENCOUNTER — Emergency Department (HOSPITAL_BASED_OUTPATIENT_CLINIC_OR_DEPARTMENT_OTHER): Payer: Medicare Other

## 2018-11-05 ENCOUNTER — Emergency Department (HOSPITAL_BASED_OUTPATIENT_CLINIC_OR_DEPARTMENT_OTHER)
Admission: EM | Admit: 2018-11-05 | Discharge: 2018-11-05 | Disposition: A | Discharge status: Home / Self Care | Payer: Medicare Other | Attending: Emergency Medicine | Admitting: Emergency Medicine

## 2018-11-05 ENCOUNTER — Encounter (HOSPITAL_BASED_OUTPATIENT_CLINIC_OR_DEPARTMENT_OTHER): Payer: Self-pay | Admitting: Emergency Medicine

## 2018-11-05 ENCOUNTER — Other Ambulatory Visit: Payer: Self-pay

## 2018-11-05 DIAGNOSIS — Z87891 Personal history of nicotine dependence: Secondary | ICD-10-CM | POA: Diagnosis not present

## 2018-11-05 DIAGNOSIS — J449 Chronic obstructive pulmonary disease, unspecified: Secondary | ICD-10-CM | POA: Diagnosis not present

## 2018-11-05 DIAGNOSIS — Y929 Unspecified place or not applicable: Secondary | ICD-10-CM | POA: Diagnosis not present

## 2018-11-05 DIAGNOSIS — S0990XA Unspecified injury of head, initial encounter: Secondary | ICD-10-CM

## 2018-11-05 DIAGNOSIS — Y939 Activity, unspecified: Secondary | ICD-10-CM | POA: Insufficient documentation

## 2018-11-05 DIAGNOSIS — Z79899 Other long term (current) drug therapy: Secondary | ICD-10-CM | POA: Diagnosis not present

## 2018-11-05 DIAGNOSIS — W19XXXA Unspecified fall, initial encounter: Secondary | ICD-10-CM | POA: Diagnosis not present

## 2018-11-05 DIAGNOSIS — I10 Essential (primary) hypertension: Secondary | ICD-10-CM | POA: Diagnosis not present

## 2018-11-05 DIAGNOSIS — S0003XA Contusion of scalp, initial encounter: Secondary | ICD-10-CM | POA: Diagnosis not present

## 2018-11-05 DIAGNOSIS — Y999 Unspecified external cause status: Secondary | ICD-10-CM | POA: Diagnosis not present

## 2018-11-05 MED ORDER — HYDROMORPHONE HCL 1 MG/ML IJ SOLN: 1.0000 mg | Freq: Once | INTRAMUSCULAR | Status: DC

## 2018-11-05 NOTE — ED Notes (Signed)
Pt ambulatory with walker to Rm2 with EMT to provide standby assistance.

## 2018-11-05 NOTE — ED Provider Notes (Signed)
[facility] EMERGENCY DEPARTMENT Provider Note   CSN: [id number] Arrival date & time: 11/05/18  1713     History   Chief Complaint Chief Complaint  Patient presents with  . Head Injury    HPI [name] is a 85 y.o. female.     Patient last seen here on August 23.  She was evaluated for a fall with a skin tear to her left forearm that occurred from a box.  Patient had no evidence of any head injury at that time.  Family states she had another fall on 24 August.  Was not evaluated for that but now she has this lump and swelling to her left temporal area.  Also has a lot of old bruising to the left side of her face and into the anterior part of her neck.  Patient is not on blood thinners.  Patient concern is for the swelling to the left temporal area.     Past Medical History:  Diagnosis Date  . Abnormal heart rhythm    pt not sure what kind.  . Anemia   . Arthritis   . C. difficile diarrhea   . COPD (chronic obstructive pulmonary disease) (HCC)   . Dry skin   . GERD (gastroesophageal reflux disease)   . Hypertension   . Hypothyroidism   . Irritable bowel syndrome   . Kidney disease   . Pneumonia   . Shortness of breath   . Weight loss    10-12 lbs in 2 months (03/2011)    Patient Active Problem List   Diagnosis Date Noted  . Anxiety and depression 07/17/2017  . Chest pain of uncertain etiology 01/17/2017  . Anemia 07/21/2016  . Arthritis, hip 04/20/2014  . Can't get food down 02/01/2014  . H/O cataract extraction 09/02/2013  . Age-related macular degeneration, dry 08/03/2013  . Glaucoma suspect 08/03/2013  . DDD (degenerative disc disease), lumbosacral 09/25/2012  . External hemorrhoid 09/25/2012  . Pain in joint, ankle and foot 08/10/2012  . Contracture of toe of left foot 08/10/2012  . Atherosclerosis of renal artery (HCC) 08/08/2012  . OP (osteoporosis) 08/08/2012  . Rhinitis, nonallergic, chronic 01/19/2012  . GERD (gastroesophageal reflux  disease) 01/19/2012  . Adult hypothyroidism 11/14/2011  . Atypical mycobacterial infection of lung (HCC) 07/03/2011  . Lung nodule 07/03/2011  . Osteoarthritis of right hip 04/26/2011  . Aseptic necrosis (HCC) 01/27/2011  . COPD (chronic obstructive pulmonary disease) with chronic bronchitis (HCC) 12/17/2010    Past Surgical History:  Procedure Laterality Date  . ANTERIOR AND POSTERIOR VAGINAL REPAIR    . CARDIAC CATHETERIZATION     6 years ago  . COLONOSCOPY    . ESOPHAGOGASTRODUODENOSCOPY    . EYE SURGERY Bilateral 09/2013   cataract surgery with lens implant  . THYROIDECTOMY     Left  . TOE AMPUTATION Left    2nd toe  . TONSILLECTOMY    . TOTAL HIP ARTHROPLASTY  04/24/2011   Procedure: TOTAL HIP ARTHROPLASTY;  Surgeon: Nestor Lewandowsky, MD;  Location: [facility] OR;  Service: Orthopedics;  Laterality: Right;  DEPUY/PINNACLE CUP, SUMMIT BASIC STEM  . TOTAL HIP ARTHROPLASTY Left 04/20/2014   Procedure: TOTAL HIP ARTHROPLASTY;  Surgeon: Nestor Lewandowsky, MD;  Location: [facility] OR;  Service: Orthopedics;  Laterality: Left;     OB History   No obstetric history on file.      Home Medications    Prior to Admission medications   Medication Sig Start Date End Date  Taking? Authorizing Provider  acetaminophen (TYLENOL) 500 MG tablet Take 1,000 mg by mouth every 8 (eight) hours as needed.     [provider]  albuterol (VENTOLIN HFA) 108 (90 Base) MCG/ACT inhaler Inhale 2 puffs into the lungs every 6 (six) hours as needed for wheezing or shortness of breath. 07/06/18   Jetty DuhamelYoung, Clinton D, MD  amLODipine (NORVASC) 5 MG tablet Take 1 tablet by mouth daily as needed.  10/14/10   [provider]  azelastine (ASTELIN) 0.1 % nasal spray Place into the nose. 09/11/17   [provider]  benzonatate (TESSALON) 200 MG capsule Take 1 capsule (200 mg total) 3 (three) times daily as needed by mouth for cough. 01/17/17   Jetty DuhamelYoung, Clinton D, MD  carboxymethylcellul-glycerin (OPTIVE) 0.5-0.9 %  ophthalmic solution Apply to eye.    [provider]  clindamycin (CLEOCIN) 300 MG capsule Take 1 capsule (300 mg total) by mouth 4 (four) times daily. X 7 days 03/30/18   Rolan BuccoBelfi, Melanie, MD  DULoxetine (CYMBALTA) 60 MG capsule Take 60 mg by mouth 3 (three) times a week.     [provider]  escitalopram (LEXAPRO) 5 MG tablet Take by mouth. 07/17/18 10/15/18  [provider]  famotidine (PEPCID) 40 MG tablet Take 1 tablet (40 mg total) daily by mouth. Patient not taking: Reported on 07/06/2018 01/17/17   Jetty DuhamelYoung, Clinton D, MD  ipratropium (ATROVENT) 0.06 % nasal spray Place 2 sprays 4 (four) times daily into the nose. If needed 01/17/17 07/31/19  Waymon BudgeYoung, Clinton D, MD  levothyroxine (SYNTHROID, LEVOTHROID) 50 MCG tablet Take 50 mcg by mouth daily before breakfast.    [provider]  LORazepam (ATIVAN) 0.5 MG tablet Take by mouth. 07/17/18   [provider]  metoprolol (TOPROL-XL) 50 MG 24 hr tablet Take 50 mg by mouth 2 (two) times daily.     [provider]  mupirocin cream (BACTROBAN) 2 % Apply 1 application topically 2 (two) times daily. 03/30/18   Rolan BuccoBelfi, Melanie, MD  saccharomyces boulardii (FLORASTOR) 250 MG capsule Take 250 mg by mouth 2 (two) times daily.    [provider]  umeclidinium bromide (INCRUSE ELLIPTA) 62.5 MCG/INH AEPB Inhale 1 puff into the lungs daily. 07/06/18   Waymon BudgeYoung, Clinton D, MD  vancomycin (VANCOCIN) 125 MG capsule Take one pill four times a day for 14 days,twice a day for 7 days,once a day for 7 days, then everyother day until pills are finished. 07/08/18   [provider]    Family History Family History  Problem Relation Age of Onset  . Emphysema Sister        smoker  . Allergies Brother   . Heart disease Brother   . Rheum arthritis Daughter   . Cancer Mother        GI  . Cancer Brother        throat  . Cancer Sister        unsure    Social History Social History   Tobacco Use  . Smoking  status: Former Smoker    Packs/day: 2.00    Years: 26.00    Pack years: 52.00    Types: Cigarettes    Quit date: 03/04/1982    Years since quitting: 36.6  . Smokeless tobacco: Never Used  Substance Use Topics  . Alcohol use: Yes    Comment: social  . Drug use: No     Allergies   Avelox [moxifloxacin hcl in nacl], Biaxin [clarithromycin], and Nsaids  Review of Systems Review of Systems  Constitutional: Negative for chills and fever.  HENT: Negative for congestion, rhinorrhea and sore throat.   Eyes: Negative for visual disturbance.  Respiratory: Negative for cough and shortness of breath.   Cardiovascular: Negative for chest pain and leg swelling.  Gastrointestinal: Negative for abdominal pain, diarrhea, nausea and vomiting.  Genitourinary: Negative for dysuria.  Musculoskeletal: Negative for back pain and neck pain.  Skin: Negative for rash.  Neurological: Negative for dizziness, light-headedness and headaches.  Hematological: Bruises/bleeds easily.  Psychiatric/Behavioral: Negative for confusion.     Physical Exam Updated Vital Signs BP 119/67 (BP Location: Left Arm)   Pulse 75   Temp 98.6 F (37 C) (Oral)   Resp 18   Ht 1.524 m (5')   Wt 45.4 kg   SpO2 99%   BMI 19.53 kg/m   Physical Exam Vitals signs and nursing note reviewed.  Constitutional:      General: She is not in acute distress.    Appearance: Normal appearance. She is well-developed.  HENT:     Head: Normocephalic.     Comments: Patient with a left temporal scalp hematoma measuring about 1 cm x 1 cm.  Soft nontender old bruising along the left side of the face.  And down into the left anterior neck. Eyes:     Extraocular Movements: Extraocular movements intact.     Conjunctiva/sclera: Conjunctivae normal.     Pupils: Pupils are equal, round, and reactive to light.  Neck:     Musculoskeletal: Normal range of motion and neck supple.  Cardiovascular:     Rate and Rhythm: Normal rate and regular  rhythm.     Heart sounds: No murmur.  Pulmonary:     Effort: Pulmonary effort is normal. No respiratory distress.     Breath sounds: Normal breath sounds.  Abdominal:     General: Bowel sounds are normal.     Palpations: Abdomen is soft.     Tenderness: There is no abdominal tenderness.  Musculoskeletal: Normal range of motion.     Comments: Skin tear to her left forearm is healed well.  Skin:    General: Skin is warm and dry.  Neurological:     General: No focal deficit present.     Mental Status: She is alert. Mental status is at baseline.     Cranial Nerves: No cranial nerve deficit.     Motor: No weakness.      ED Treatments / Results  Labs (all labs ordered are listed, but only abnormal results are displayed) Labs Reviewed - No data to display  EKG None  Radiology Ct Head Wo Contrast  Result Date: 11/05/2018 CLINICAL DATA:  Head injury 1 week ago.  Enlarging bump on the head. EXAM: CT HEAD WITHOUT CONTRAST TECHNIQUE: Contiguous axial images were obtained from the base of the skull through the vertex without intravenous contrast. COMPARISON:  05/14/2018 FINDINGS: Brain: Generalized age related atrophy. Chronic small-vessel ischemic changes of the cerebral hemispheric white matter. No sign of recent infarction, mass lesion, intracranial hemorrhage, hydrocephalus or extra-axial collection. Vascular: There is atherosclerotic calcification of the major vessels at the base of the brain. Skull: No skull fracture. Sinuses/Orbits: Clear/normal Other: Focal hematoma in the left frontotemporal scalp measuring 8 x 26 mm. IMPRESSION: No acute intracranial finding. Atrophy and chronic small-vessel ischemic changes. No skull fracture. Left frontotemporal scalp hematoma measuring about 8 x 26 mm. Electronically Signed   By: Nelson Chimes M.D.   On: 11/05/2018 18:54  Procedures Procedures (including critical care time)  Medications Ordered in ED Medications - No data to display    Initial Impression / Assessment and Plan / ED Course  I have reviewed the triage vital signs and the nursing notes.  Pertinent labs & imaging results that were available during my care of the patient were reviewed by me and considered in my medical decision making (see chart for details).        It sounds as if injury occurred the day after we saw her on 23 August.  Patient had another fall.  Hitting the left side of her head.  This when the bump arrived this explains all the old bruising.  Patient states a friend put some Steri-Strips on now healed laceration in that area.  Head CT was done to rule out any skull fracture or intracranial injury.  It was negative.  Patient stable for discharge home and follow-up.  Fall precautions provided.  The patient has a history of frequent falls.  In addition patient's left arm avulsion skin tear that was treated during her visit here on August 23 is healed well.  Final Clinical Impressions(s) / ED Diagnoses   Final diagnoses:  Injury of head, initial encounter  Scalp hematoma, initial encounter    ED Discharge Orders    None       Vanetta MuldersZackowski, Zayd Bonet, MD 11/05/18 1926

## 2018-11-05 NOTE — Discharge Instructions (Addendum)
CT scan of the head without evidence of any skull fracture or brain injury.  Does confirm that the bump on the left temporal area is a scalp hematoma.  No specific treatment required.

## 2018-11-05 NOTE — ED Triage Notes (Signed)
Seen 1 week ago for a head injury. States the bump on her head is worse.

## 2018-12-11 ENCOUNTER — Telehealth: Payer: Self-pay | Admitting: Internal Medicine

## 2018-12-11 MED ORDER — IPRATROPIUM BROMIDE 0.06 % NA SOLN: 2.0000 | Freq: Four times a day (QID) | NASAL | 3 refills | Status: AC

## 2018-12-11 NOTE — Telephone Encounter (Signed)
Spoke with patient. She stated that she would like to have a refill on her ipratropium nasal spray. She would like for this to be sent to [facility] in [place]. Advised patient that I would go ahead and send in the refill for her. She verbalized understanding.   Nothing further needed at time of call.

## 2019-07-06 ENCOUNTER — Ambulatory Visit: Payer: Medicare Other | Admitting: Internal Medicine

## 2020-05-15 IMAGING — CT CT MAXILLOFACIAL W/O CM
4 of 6 series · 16 of 47 positions shown, 18 images · non-contrast
Comparison: 01/16/2011 head CT

CLINICAL DATA: Fall with blunt trauma to maxillofacial structures.
Initial encounter.

EXAM:
CT HEAD WITHOUT CONTRAST
CT MAXILLOFACIAL WITHOUT CONTRAST
TECHNIQUE: Multidetector CT imaging of the head and maxillofacial structures
were performed using the standard protocol without intravenous
contrast. Multiplanar CT image reconstructions of the maxillofacial
structures were also generated.

[Series 2: head wo · axial · 0.41mm/px · z∈[-171,-66]mm · 6 of 31 slices shown, 8 images]
[im 5/31  brain]
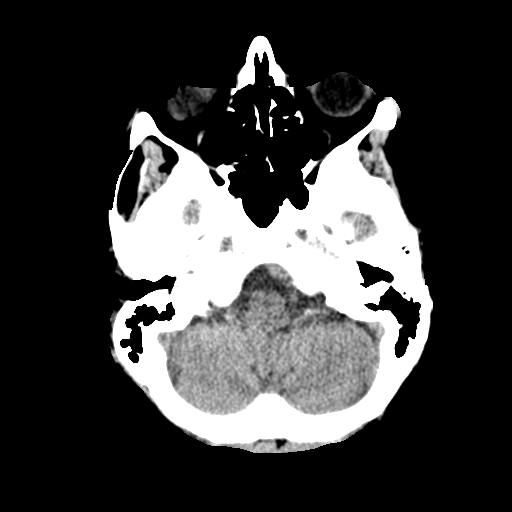
[im 5/31  bone]
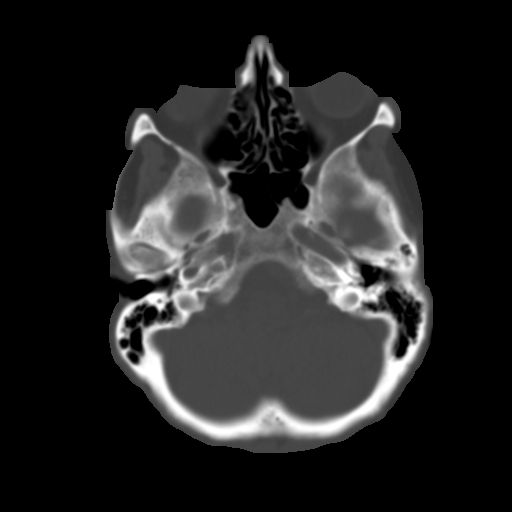
[im 9/31  bone]
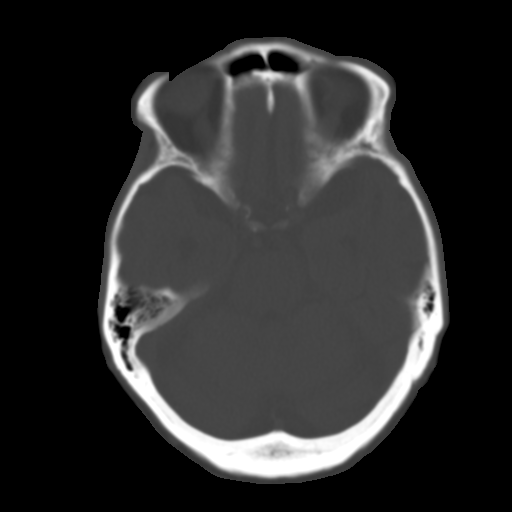
[im 13/31  bone]
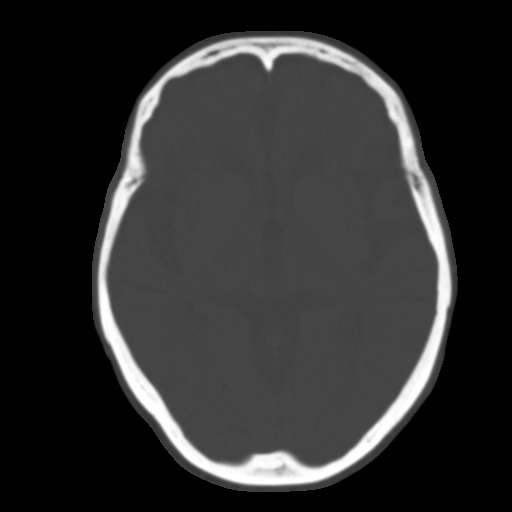
[im 18/31  bone]
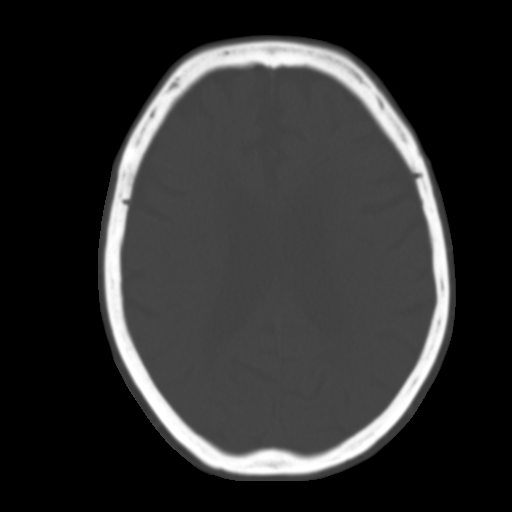
[im 22/31  brain]
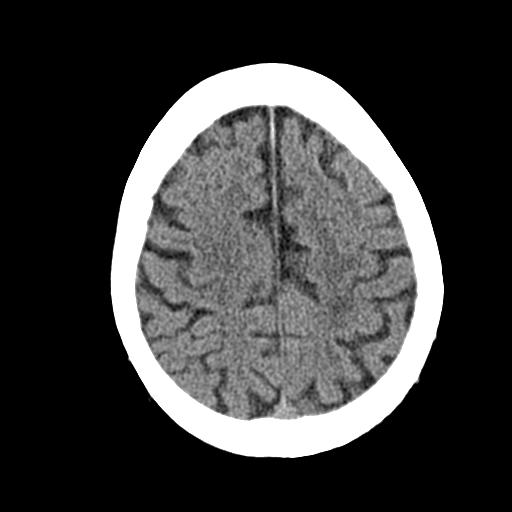
[im 22/31  bone]
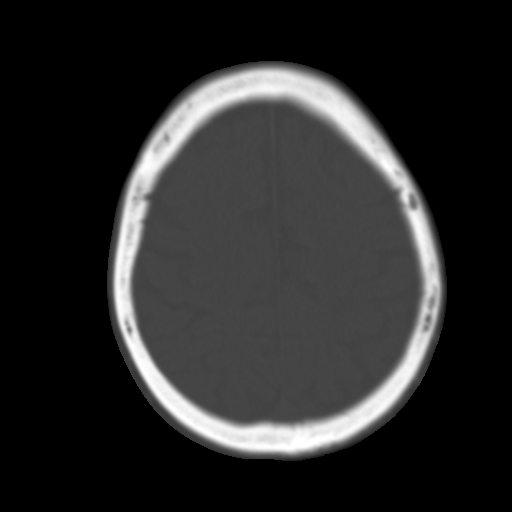
[im 26/31  bone]
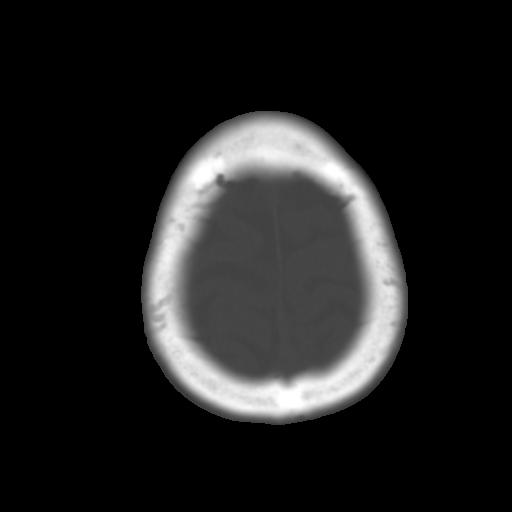

[Series 4: cor head wo · coronal · 0.30mm/px · 3 of 84 slices shown]
[im 21/84  bone]
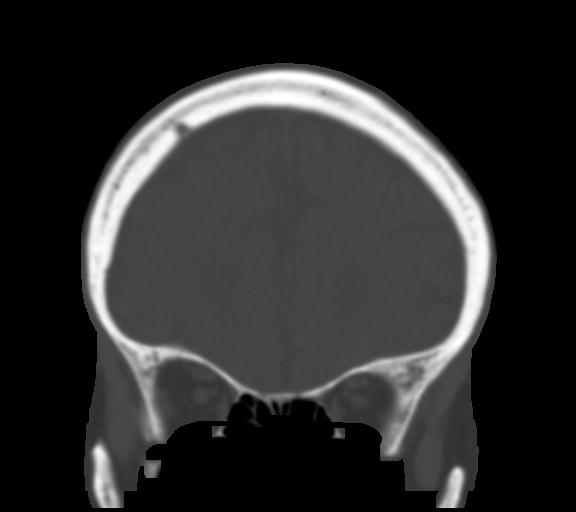
[im 42/84  bone]
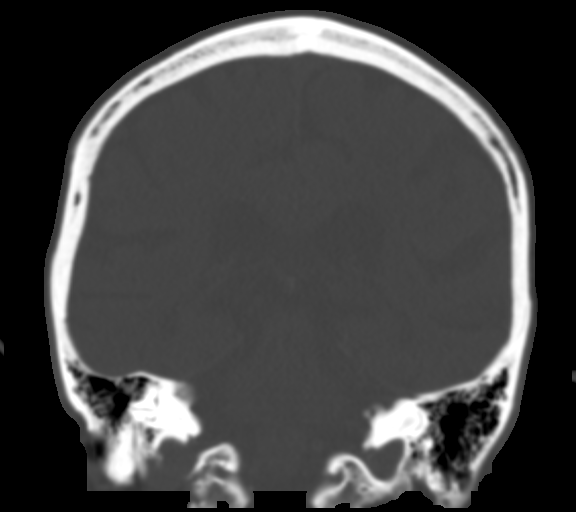
[im 63/84  bone]
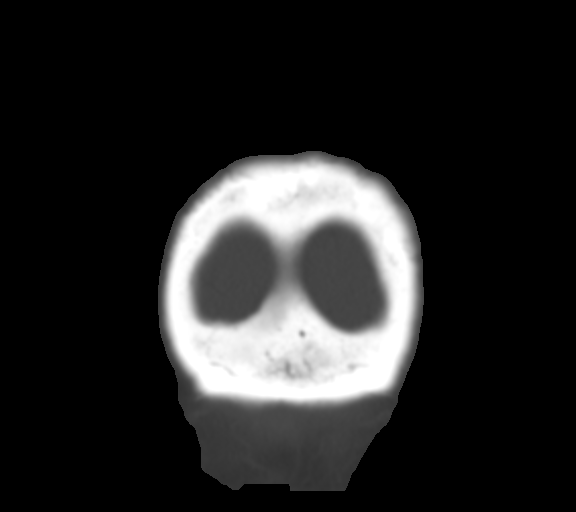

[Series 5: sag head wo · sagittal · 0.31mm/px · 1 of 52 slices shown]
[im 26/52  bone]
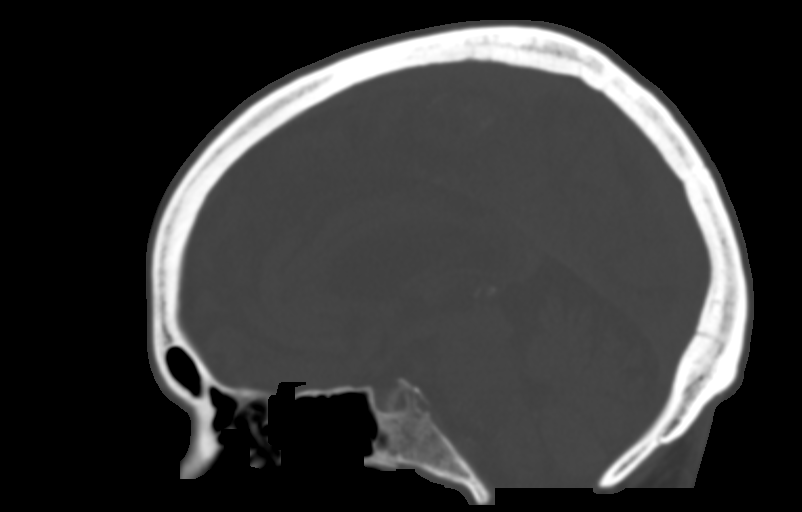

[Series 6: max soft · axial · 0.39mm/px · z∈[-286,-190]mm · 6 of 86 slices shown]
[im 9/86  brain]
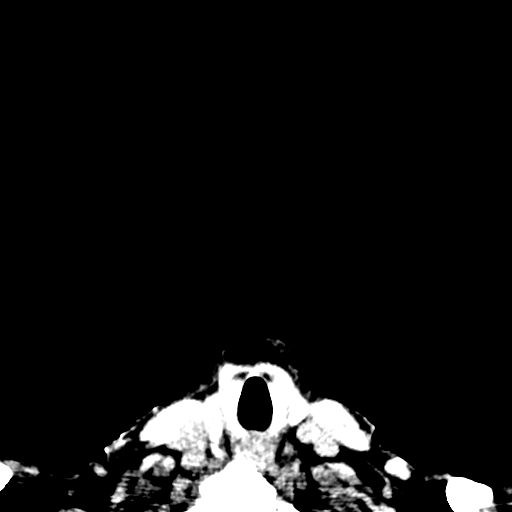
[im 17/86  brain]
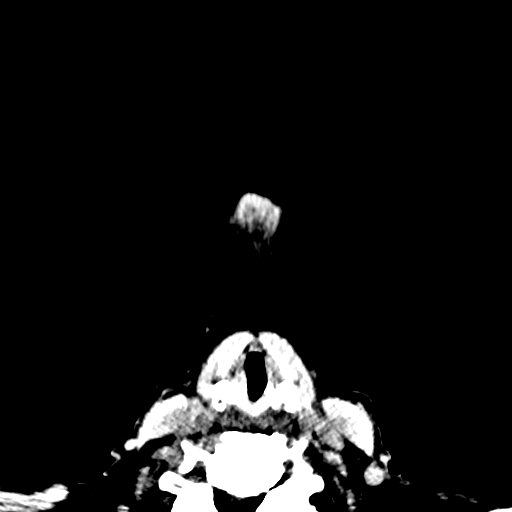
[im 29/86  brain]
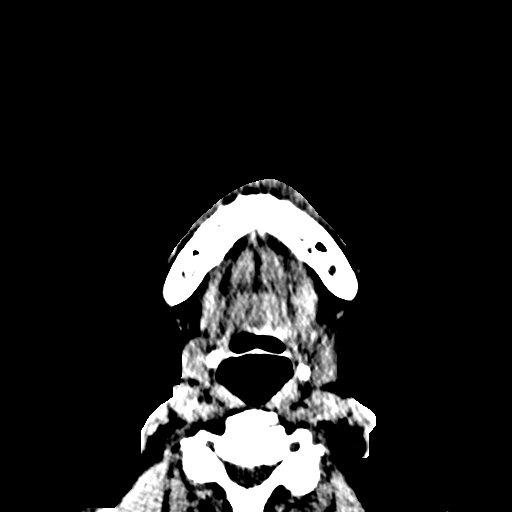
[im 37/86  brain]
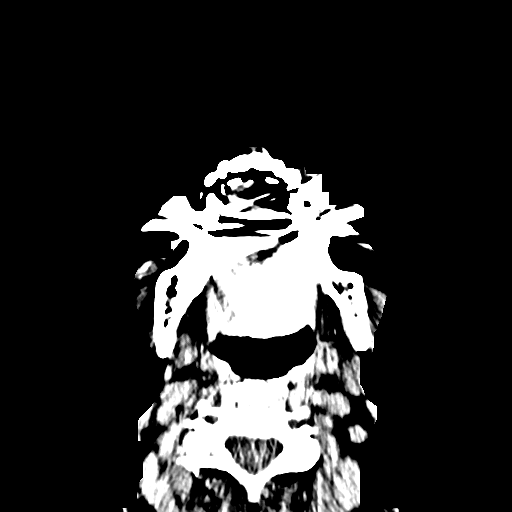
[im 49/86  brain]
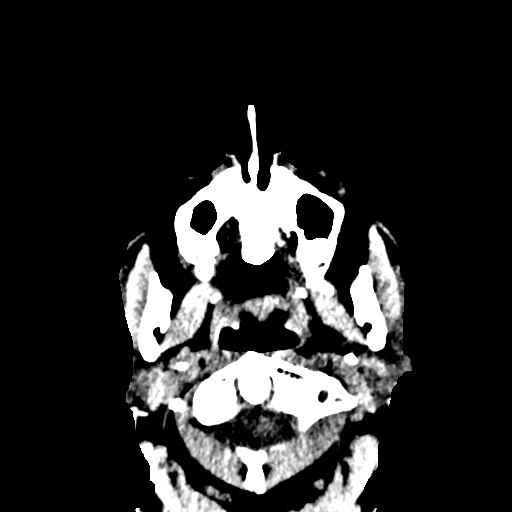
[im 57/86  brain]
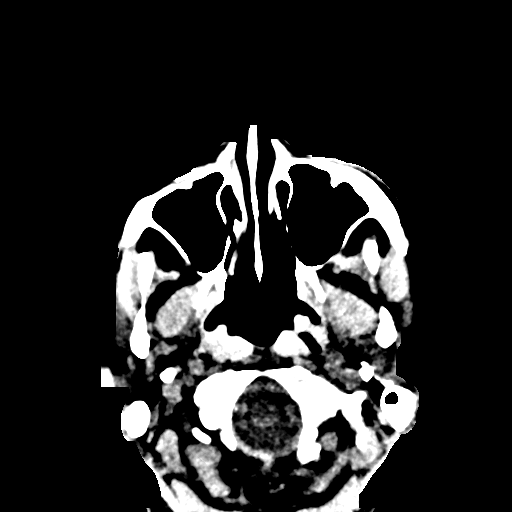

[16 of 47 positions shown; findings below may reference images not displayed]

FINDINGS: CT HEAD FINDINGS

Brain: No evidence of acute infarction, hemorrhage, hydrocephalus,
extra-axial collection or mass lesion/mass effect. Intermittent mild
high-density appearance of the falx is stable. No parafalcine
collection noted. Cerebral volume loss and mild for age chronic
small vessel ischemia.

Vascular: Atherosclerotic calcification

Skull: Negative

CT MAXILLOFACIAL FINDINGS

Osseous: Negative for fracture or mandibular dislocation.

Orbits: Soft tissue swelling superficial to the left orbit. No
evidence of postseptal injury. Bilateral cataract resection.

Sinuses: Negative

Soft tissues: Swelling is noted above. No opaque foreign body or
hematoma
IMPRESSION: 1. No evidence of intracranial injury or facial fracture.
2. Left periorbital contusion.  No evidence of postseptal injury.
